# Patient Record
Sex: Male | Born: 1950
Health system: Southern US, Community
[De-identification: ages and names within clinical notes are randomized; demographics above are authoritative.]

## PROBLEM LIST (undated history)

## (undated) DIAGNOSIS — I219 Acute myocardial infarction, unspecified: Secondary | ICD-10-CM

## (undated) DIAGNOSIS — R7303 Prediabetes: Secondary | ICD-10-CM

## (undated) DIAGNOSIS — Z8719 Personal history of other diseases of the digestive system: Secondary | ICD-10-CM

## (undated) DIAGNOSIS — I2510 Atherosclerotic heart disease of native coronary artery without angina pectoris: Secondary | ICD-10-CM

## (undated) DIAGNOSIS — I10 Essential (primary) hypertension: Secondary | ICD-10-CM

## (undated) DIAGNOSIS — E785 Hyperlipidemia, unspecified: Secondary | ICD-10-CM

## (undated) HISTORY — DX: Hyperlipidemia, unspecified: E78.5

## (undated) HISTORY — PX: CHOLECYSTECTOMY: SHX55

## (undated) HISTORY — PX: COLONOSCOPY: SHX174

## (undated) HISTORY — PX: CORONARY ANGIOPLASTY: SHX604

## (undated) HISTORY — DX: Essential (primary) hypertension: I10

## (undated) HISTORY — DX: Prediabetes: R73.03

---

## 1998-09-26 ENCOUNTER — Encounter: Payer: Self-pay | Admitting: Cardiology

## 1998-09-26 ENCOUNTER — Ambulatory Visit (HOSPITAL_COMMUNITY): Admission: RE | Admit: 1998-09-26 | Discharge: 1998-09-26 | Payer: Self-pay | Admitting: Cardiology

## 2001-07-16 ENCOUNTER — Ambulatory Visit (HOSPITAL_COMMUNITY): Admission: RE | Admit: 2001-07-16 | Discharge: 2001-07-16 | Payer: Self-pay | Admitting: Gastroenterology

## 2005-07-29 ENCOUNTER — Emergency Department (HOSPITAL_COMMUNITY): Admission: EM | Admit: 2005-07-29 | Discharge: 2005-07-29 | Payer: Self-pay | Admitting: Emergency Medicine

## 2005-08-01 ENCOUNTER — Ambulatory Visit (HOSPITAL_COMMUNITY): Admission: RE | Admit: 2005-08-01 | Discharge: 2005-08-02 | Payer: Self-pay | Admitting: General Surgery

## 2005-08-01 ENCOUNTER — Encounter (INDEPENDENT_AMBULATORY_CARE_PROVIDER_SITE_OTHER): Payer: Self-pay | Admitting: Specialist

## 2008-08-11 ENCOUNTER — Ambulatory Visit (HOSPITAL_COMMUNITY): Admission: RE | Admit: 2008-08-11 | Discharge: 2008-08-11 | Payer: Self-pay | Admitting: Internal Medicine

## 2009-12-03 DIAGNOSIS — I2510 Atherosclerotic heart disease of native coronary artery without angina pectoris: Secondary | ICD-10-CM

## 2009-12-03 DIAGNOSIS — I219 Acute myocardial infarction, unspecified: Secondary | ICD-10-CM

## 2009-12-03 HISTORY — DX: Acute myocardial infarction, unspecified: I21.9

## 2009-12-03 HISTORY — DX: Atherosclerotic heart disease of native coronary artery without angina pectoris: I25.10

## 2010-05-12 ENCOUNTER — Inpatient Hospital Stay (HOSPITAL_COMMUNITY): Admission: EM | Admit: 2010-05-12 | Discharge: 2010-05-16 | Payer: Self-pay | Admitting: Emergency Medicine

## 2010-05-12 ENCOUNTER — Ambulatory Visit: Payer: Self-pay | Admitting: Cardiology

## 2010-05-31 DIAGNOSIS — R0789 Other chest pain: Secondary | ICD-10-CM | POA: Insufficient documentation

## 2010-05-31 DIAGNOSIS — I214 Non-ST elevation (NSTEMI) myocardial infarction: Secondary | ICD-10-CM | POA: Insufficient documentation

## 2010-05-31 DIAGNOSIS — R5383 Other fatigue: Secondary | ICD-10-CM

## 2010-05-31 DIAGNOSIS — Z8679 Personal history of other diseases of the circulatory system: Secondary | ICD-10-CM | POA: Insufficient documentation

## 2010-05-31 DIAGNOSIS — R5381 Other malaise: Secondary | ICD-10-CM | POA: Insufficient documentation

## 2010-05-31 DIAGNOSIS — I2510 Atherosclerotic heart disease of native coronary artery without angina pectoris: Secondary | ICD-10-CM | POA: Insufficient documentation

## 2010-05-31 DIAGNOSIS — E782 Mixed hyperlipidemia: Secondary | ICD-10-CM | POA: Insufficient documentation

## 2010-06-07 ENCOUNTER — Encounter: Payer: Self-pay | Admitting: Family Medicine

## 2010-06-14 ENCOUNTER — Encounter: Payer: Self-pay | Admitting: Physician Assistant

## 2010-06-14 ENCOUNTER — Ambulatory Visit: Payer: Self-pay | Admitting: Cardiology

## 2010-06-22 ENCOUNTER — Encounter (HOSPITAL_COMMUNITY): Admission: RE | Admit: 2010-06-22 | Discharge: 2010-09-01 | Payer: Self-pay | Admitting: Cardiology

## 2010-06-22 ENCOUNTER — Telehealth (INDEPENDENT_AMBULATORY_CARE_PROVIDER_SITE_OTHER): Payer: Self-pay | Admitting: *Deleted

## 2010-07-06 ENCOUNTER — Encounter: Payer: Self-pay | Admitting: Cardiology

## 2010-07-25 ENCOUNTER — Ambulatory Visit: Payer: Self-pay | Admitting: Cardiology

## 2010-07-25 DIAGNOSIS — I10 Essential (primary) hypertension: Secondary | ICD-10-CM | POA: Insufficient documentation

## 2010-07-26 LAB — CONVERTED CEMR LAB
ALT: 35 units/L (ref 0–53)
AST: 32 units/L (ref 0–37)
Albumin: 4 g/dL (ref 3.5–5.2)
Alkaline Phosphatase: 47 units/L (ref 39–117)
Bilirubin, Direct: 0.1 mg/dL (ref 0.0–0.3)
Cholesterol: 95 mg/dL (ref 0–200)
HDL: 31.6 mg/dL — ABNORMAL LOW (ref >39.00)
LDL Cholesterol: 46 mg/dL (ref 0–99)
Total Bilirubin: 0.6 mg/dL (ref 0.3–1.2)
Total CHOL/HDL Ratio: 3
Total Protein: 7.1 g/dL (ref 6.0–8.3)
Triglycerides: 88 mg/dL (ref 0.0–149.0)
VLDL: 17.6 mg/dL (ref 0.0–40.0)

## 2010-08-18 ENCOUNTER — Ambulatory Visit: Payer: Self-pay | Admitting: Cardiology

## 2010-08-23 ENCOUNTER — Encounter: Payer: Self-pay | Admitting: Cardiology

## 2010-09-02 ENCOUNTER — Encounter (HOSPITAL_COMMUNITY): Admission: RE | Admit: 2010-09-02 | Discharge: 2010-11-01 | Payer: Self-pay | Admitting: Cardiology

## 2010-09-13 ENCOUNTER — Encounter: Payer: Self-pay | Admitting: Cardiology

## 2010-09-19 ENCOUNTER — Ambulatory Visit: Payer: Self-pay | Admitting: Cardiology

## 2010-09-20 ENCOUNTER — Encounter: Payer: Self-pay | Admitting: Cardiology

## 2010-09-20 LAB — CONVERTED CEMR LAB
ALT: 34 units/L (ref 0–53)
AST: 31 units/L (ref 0–37)
Albumin: 4 g/dL (ref 3.5–5.2)
Alkaline Phosphatase: 59 units/L (ref 39–117)
Bilirubin, Direct: 0.1 mg/dL (ref 0.0–0.3)
Cholesterol: 120 mg/dL (ref 0–200)
HDL: 39.2 mg/dL (ref >39.00)
LDL Cholesterol: 66 mg/dL (ref 0–99)
Total Bilirubin: 0.6 mg/dL (ref 0.3–1.2)
Total CHOL/HDL Ratio: 3
Total Protein: 7.3 g/dL (ref 6.0–8.3)
Triglycerides: 72 mg/dL (ref 0.0–149.0)
VLDL: 14.4 mg/dL (ref 0.0–40.0)

## 2010-11-17 ENCOUNTER — Ambulatory Visit: Payer: Self-pay | Admitting: Cardiology

## 2010-12-08 ENCOUNTER — Encounter: Payer: Self-pay | Admitting: Cardiology

## 2011-01-02 NOTE — Progress Notes (Signed)
  Phone Note From Other Clinic   Caller: [facility] Initial call taken by: KM    LOV,12 lead faxed to [phone] [facility]  June 22, 2010 10:46 AM

## 2011-01-02 NOTE — Assessment & Plan Note (Signed)
Summary: 2 MONTH ROV/SL   Visit Type:  2 mo f/u Primary Sanjit Mcmichael:  Marisue Brooklyn  CC:  no cardiac complaints today.  History of Present Illness: Mr Koloski comes in today for evaluation and management of his coronary artery disease, hypertension, hyperlipidemia.  He has done remarkably well over the last couple months since his event. He had a drug-eluting stent placed to the mid and distal right coronary artery. Since that time he's had no recurrent gel taking or chest discomfort.  He's lost 15 pounds. 40 mg of Crestor his total cholesterol was 95, triglycerides 88, LDL 46, HDL 31.6 LFTs were normal. We cut it to 20 mg in half all blood work scheduled in the future.  He had nonobstructive disease in the LAD and some luminal irregularities in the circumflex. His ejection fraction was 50% with hypokinesia the basal inferior wall.    Clinical Reports Reviewed:  Cardiac Cath:  05/16/2010: Cardiac Cath Findings:   FINDINGS:   1. Hemodynamics:  Aorta 115/69, LV 116/10.   2. Left ventriculography:  There was hypokinesis of the basal to mid       inferior wall.  EF was estimated about 50%.   3. Right coronary artery:  The right coronary artery was a dominant       vessel.  There was 95% mid RCA stenosis.  There was about 95% ostial PLV stenosis.  There was a branch       vessel off the proximal PLV that appeared to be totally occluded.   4. Left main:  Luminal irregularities in the left main.   5. Left circumflex system:  There were luminal irregularities in the       left circumflex and a large first obtuse marginal.   6. LAD system:  There was a moderate-sized first diagonal luminal       irregularities.  There was about 30% proximal LAD stenosis and 30%       to 40% diffuse distal LAD stenosis.      IMPRESSION:  A 60 year old with a non-ST-elevation myocardial   infarction, culprit is a tight mid right coronary artery lesion and   ostial posterior left ventricular lesion.  We will  plan percutaneous   coronary intervention today.               Marca Ancona, MD         05/15/2010: Cardiac Cath Findings:   CONCLUSION:  Successful PCI of tandem lesions in the mid and distal   right coronary artery using Promus drug-eluting stents with improvement   in mid lesion from 95% to 0% and improvement in distal lesion from 95%   to 0%.      DISPOSITION:  The patient returned to post angio room for further   observation.  We will plan Plavix for a minimum of 1 year.               Bruce Elvera Lennox Juanda Chance, MD, [facility]          Current Medications (verified): 1)  Aspirin Ec 325 Mg Tbec (Aspirin) .... Take One Tablet By Mouth Daily 2)  Benicar 20 Mg Tabs (Olmesartan Medoxomil) .... Take One Tablet By Mouth Daily 3)  Fish Oil 1000 Mg Caps (Omega-3 Fatty Acids) .... 2 Cap Two Times A Day 4)  Flax Seed Oil 1000 Mg Caps (Flaxseed (Linseed)) .... 2 Tabs Two Times A Day 5)  Multivitamins   Tabs (Multiple Vitamin) .Marland Kitchen.. 1 Tab Once Daily 6)  Vitamin D3 2000 Unit Caps (Cholecalciferol) .Marland Kitchen.. 1 Cap Two Times A Day 7)  Zantac 75 75 Mg Tabs (Ranitidine Hcl) .Marland Kitchen.. 1 Tab Once Daily 8)  Crestor 40 Mg Tabs (Rosuvastatin Calcium) .... Take One- Half Tablet Daily 9)  Plavix 75 Mg Tabs (Clopidogrel Bisulfate) .... Take One Tablet By Mouth Daily 10)  Metoprolol Tartrate 25 Mg Tabs (Metoprolol Tartrate) .... 1/2 Tab Bid 11)  Allegra 180 Mg Tabs (Fexofenadine Hcl) .... Daily  Allergies (verified): No Known Drug Allergies  Past History:  Past Medical History: Last updated: 05/31/2010 1. Hypertension for approximately 5 years.  2. Hyperlipidemia, borderline.  3. Status post stress test x2, the last one 5-6 years ago and without       significant abnormality per the patient.   Past Surgical History: Last updated: 05/31/2010 Cholecystectomy Colonoscopy  Family History: Last updated: 05/31/2010  His mother is alive at 54 with no heart disease and his   father died at 79 with a history of  heart disease and stroke.  No   siblings have heart disease.  However, he has one uncle and three   cousins who died suddenly.  The patient was told that they died of heart  attacks, but they died suddenly, one on a golf course and one in his  sleep and to the patient's knowledge, no autopsy was done.      Social History: Last updated: 05/31/2010  He lives in [place] with his wife.  They run a   dairy farm.  He quit tobacco greater than 20 years ago with   approximately 10 pack-year history and denies alcohol or drug abuse.      Review of Systems       negative other than history of present illness  Vital Signs:  Patient profile:   60 year old male Height:      70 inches Weight:      222 pounds BMI:     31.97 Pulse rate:   70 / minute Pulse rhythm:   regular BP sitting:   122 / 70  (left arm) Cuff size:   large  Vitals Entered By: Danielle Rankin, CMA (August 18, 2010 2:59 PM)  Physical Exam  General:  muscular, no acute distress, overweight Head:  normocephalic and atraumatic Eyes:  PERRLA/EOM intact; conjunctiva and lids normal. Neck:  Neck supple, no JVD. No masses, thyromegaly or abnormal cervical nodes. Chest Wall:  no deformities or breast masses noted Lungs:  Clear bilaterally to auscultation and percussion. Heart:  PMI nondisplaced, regular rate and rhythm, normal S1-S2, carotid upstrokes are equal without bruits Msk:  Back normal, normal gait. Muscle strength and tone normal. Pulses:  pulses normal in all 4 extremities Extremities:  No clubbing or cyanosis. Neurologic:  Alert and oriented x 3. Skin:  Intact without lesions or rashes. Psych:  Normal affect.   Impression & Recommendations:  Problem # 1:  CAD, NATIVE VESSEL (ICD-414.01) Assessment Improved  His updated medication list for this problem includes:    Aspirin Ec 325 Mg Tbec (Aspirin) .Marland Kitchen... Take one tablet by mouth daily    Plavix 75 Mg Tabs (Clopidogrel bisulfate) .Marland Kitchen... Take one tablet by  mouth daily    Metoprolol Tartrate 25 Mg Tabs (Metoprolol tartrate) .Marland Kitchen... 1/2 tab bid  Problem # 2:  ESSENTIAL HYPERTENSION, BENIGN (ICD-401.1) Assessment: Improved  His updated medication list for this problem includes:    Aspirin Ec 325 Mg Tbec (Aspirin) .Marland Kitchen... Take one tablet by mouth daily  Benicar 20 Mg Tabs (Olmesartan medoxomil) .Marland Kitchen... Take one tablet by mouth daily    Metoprolol Tartrate 25 Mg Tabs (Metoprolol tartrate) .Marland Kitchen... 1/2 tab bid  Problem # 3:  MYOCARDIAL INFARCTION, ACUTE, SUBENDOCARDIAL (ICD-410.70) Assessment: Unchanged  His updated medication list for this problem includes:    Aspirin Ec 325 Mg Tbec (Aspirin) .Marland Kitchen... Take one tablet by mouth daily    Plavix 75 Mg Tabs (Clopidogrel bisulfate) .Marland Kitchen... Take one tablet by mouth daily    Metoprolol Tartrate 25 Mg Tabs (Metoprolol tartrate) .Marland Kitchen... 1/2 tab bid  Problem # 4:  HYPERLIPIDEMIA, BORDERLINE (ICD-272.4) Assessment: Improved followup labs in 4-6 weeks His updated medication list for this problem includes:    Crestor 40 Mg Tabs (Rosuvastatin calcium) .Marland Kitchen... Take one- half tablet daily  Patient Instructions: 1)  Your physician recommends that you schedule a follow-up appointment in: December 16,11 at 10:45 am 2)  Your physician recommends that you continue on your current medications as directed. Please refer to the Current Medication list given to you today. 3)  Your physician recommends that you return for a FASTING lipid profile and liver  Monday October 17,11 at 8:45 am

## 2011-01-02 NOTE — Miscellaneous (Signed)
Summary: [facility]   [facility]   Imported By: Roderic Ovens 08/14/2010 13:14:52  _____________________________________________________________________  External Attachment:    Type:   Image     Comment:   External Document

## 2011-01-02 NOTE — Letter (Signed)
[facility]   Imported By: Marylou Mccoy 08/08/2010 12:53:09  _____________________________________________________________________  External Attachment:    Type:   Image     Comment:   External Document

## 2011-01-02 NOTE — Assessment & Plan Note (Signed)
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 He had an ejection fraction of 50% with inferior hypokinesis. Otherwise he had nonobstructive coronary disease.  The patient has gone back to work full time and is working 14-16 hour days according to his wife. He is also out in the heat. He feels well without chest pain and says he is no longer tired like he was the first 2 weeks after hospitalization. He denies chest pain palpitations dyspnea dyspnea on exertion dizziness or presyncope. He is supposed to start cardiac rehabilitation but is reluctant to do this.  Current Medications (verified): 1)  Aspirin Ec 325 Mg Tbec (Aspirin) .... Take One Tablet By Mouth Daily 2)  Benicar 20 Mg Tabs (Olmesartan Medoxomil) .... Take One Tablet By Mouth Daily 3)  Fish Oil 1000 Mg Caps (Omega-3 Fatty Acids) .... 2 Cap Two Times A Day 4)  Flax Seed Oil 1000 Mg Caps (Flaxseed (Linseed)) .... 2 Tabs Two Times A Day 5)  Multivitamins   Tabs (Multiple Vitamin) .Marland Kitchen.. 1 Tab Once Daily 6)  Vitamin D3 2000 Unit Tabs (Cholecalciferol) .Marland Kitchen.. 1 Tab Two Times A Day 7)  Zantac 75 75 Mg Tabs (Ranitidine Hcl) .Marland Kitchen.. 1 Tab Once Daily 8)  Crestor 40 Mg Tabs (Rosuvastatin Calcium) .... Take One Tablet By Mouth Daily. 9)  Plavix 75 Mg Tabs (Clopidogrel Bisulfate) .... Take One Tablet By Mouth Daily 10)  Metoprolol Tartrate 25 Mg Tabs (Metoprolol Tartrate) .... 1/2 Tab Bid 11)  Allegra 180 Mg Tabs (Fexofenadine Hcl) .... Daily  Allergies (verified): No Known Drug Allergies  Past History:  Past Medical History: Last updated: 05/31/2010 1. Hypertension for approximately 5 years.  2. Hyperlipidemia, borderline.  3. Status post stress test x2, the last one 5-6 years ago and without   significant abnormality per the patient.   Social History: Reviewed history from 05/31/2010 and no changes required.  He lives in [place] with his wife.  They run a   dairy farm.  He quit tobacco greater than 20 years ago with   approximately 10 pack-year history and denies alcohol or drug abuse.      Review of Systems       see history of present illness  Vital Signs:  Patient profile:   60 year old male Height:      70 inches Weight:      224 pounds BMI:     32.26 BP sitting:   116 / 79  (left arm) Cuff size:   large  Vitals Entered By: Burnett Kanaris, CNA (June 14, 2010 12:19 PM)  Physical Exam  General:   Well-nournished, in no acute distress. Neck: No JVD, HJR, Bruit, or thyroid enlargement Lungs: No tachypnea, clear without wheezing, rales, or rhonchi Cardiovascular: RRR, PMI not displaced, heart sounds normal, no murmurs, gallops, bruit, thrill, or heave. Abdomen: BS normal. Soft without organomegaly, masses, lesions or tenderness. Extremities: right groin without hematoma or hemorrhage,without cyanosis, clubbing or edema. Good distal pulses bilateral SKin: Warm, no lesions or rashes  Musculoskeletal: No deformities Neuro: no focal signs    EKG  Procedure date:  06/14/2010  Findings:      normal sinus rhythm normal EKG  Impression & Recommendations:  Problem # 1:  CAD, NATIVE VESSEL (  ICD-414.01) Patient is status post non-ST elevation MI treated with 2 drug-eluting stents to the RCA. 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 I've asked him to cut back on this and have encouraged cardiac rehabilitation. His updated medication list for this problem includes:    Aspirin Ec 325 Mg Tbec (Aspirin) .Marland Kitchen... Take one tablet by mouth daily    Plavix 75 Mg Tabs (Clopidogrel bisulfate) .Marland Kitchen... Take one tablet by mouth daily    Metoprolol Tartrate 25 Mg Tabs (Metoprolol tartrate) .Marland Kitchen... 1/2 tab bid  His updated medication list for this problem includes:     Aspirin Ec 325 Mg Tbec (Aspirin) .Marland Kitchen... Take one tablet by mouth daily    Plavix 75 Mg Tabs (Clopidogrel bisulfate) .Marland Kitchen... Take one tablet by mouth daily    Metoprolol Tartrate 25 Mg Tabs (Metoprolol tartrate) .Marland Kitchen... 1/2 tab bid  Problem # 2:  HYPERTENSION, HX OF (ICD-V12.50) blood pressure controlled  Problem # 3:  HYPERLIPIDEMIA, BORDERLINE (ICD-272.4) check LFTs and lipid profile in 6 weeks His updated medication list for this problem includes:    Crestor 40 Mg Tabs (Rosuvastatin calcium) .Marland Kitchen... Take one tablet by mouth daily.  Other Orders: EKG w/ Interpretation (93000)  Patient Instructions: 1)  Your physician recommends that you schedule a follow-up appointment in: 2 MONTHS WITH DR WALL 2)  Your physician recommends that you return for lab work in: 6 WEEKS FASTING LIPID LIVER  3)  Your physician recommends that you continue on your current medications as directed. Please refer to the Current Medication list given to you today.

## 2011-01-02 NOTE — Letter (Signed)
[facility]   Imported By: Marylou Mccoy 09/11/2010 08:19:57  _____________________________________________________________________  External Attachment:    Type:   Image     Comment:   External Document

## 2011-01-02 NOTE — Miscellaneous (Signed)
Summary: [facility]   [facility]   Imported By: Roderic Ovens 06/13/2010 14:20:35  _____________________________________________________________________  External Attachment:    Type:   Image     Comment:   External Document

## 2011-01-02 NOTE — Miscellaneous (Signed)
Summary: [facility] Progress Note   [facility] Progress Note   Imported By: Roderic Ovens 09/29/2010 15:26:13  _____________________________________________________________________  External Attachment:    Type:   Image     Comment:   External Document

## 2011-01-04 NOTE — Assessment & Plan Note (Signed)
Summary: f71m/dfg   Visit Type:  3 mo f/u Primary Provider:  Marisue Brooklyn  CC:  pt denies any cardiac complaints today...no leg cramps/pain.  History of Present Illness: [name] returns today 6 months out from his non-ST segment elevation MI and drug-eluting stents to his right coronary artery.  He still working hard manually and having no angina or ischemic symptoms. He is carrying nitroglycerin. He is very compliant with his diet and his weight has stayed down. He recently had blood work which have reviewed with him today showing a total cholesterol 120, triglycerides 72, LDL 66, HDL 39.2. LFTs were normal. Fasting blood sugar from his primary care office was normal as was his renal function.  Clinical Reports Reviewed:  Cardiac Cath:  05/16/2010: Cardiac Cath Findings:   FINDINGS:   1. Hemodynamics:  Aorta 115/69, LV 116/10.   2. Left ventriculography:  There was hypokinesis of the basal to mid       inferior wall.  EF was estimated about 50%.   3. Right coronary artery:  The right coronary artery was a dominant       vessel.  There was 95% mid RCA stenosis.  There was about 95% ostial PLV stenosis.  There was a branch       vessel off the proximal PLV that appeared to be totally occluded.   4. Left main:  Luminal irregularities in the left main.   5. Left circumflex system:  There were luminal irregularities in the       left circumflex and a large first obtuse marginal.   6. LAD system:  There was a moderate-sized first diagonal luminal       irregularities.  There was about 30% proximal LAD stenosis and 30%       to 40% diffuse distal LAD stenosis.      IMPRESSION:  A 60 year old with a non-ST-elevation myocardial   infarction, culprit is a tight mid right coronary artery lesion and   ostial posterior left ventricular lesion.  We will plan percutaneous   coronary intervention today.               Marca Ancona, MD         05/15/2010: Cardiac Cath Findings:    CONCLUSION:  Successful PCI of tandem lesions in the mid and distal   right coronary artery using Promus drug-eluting stents with improvement   in mid lesion from 95% to 0% and improvement in distal lesion from 95%   to 0%.      DISPOSITION:  The patient returned to post angio room for further   observation.  We will plan Plavix for a minimum of 1 year.               Bruce Elvera Lennox Juanda Chance, MD, [facility]          Current Medications (verified): 1)  Aspirin Ec 325 Mg Tbec (Aspirin) .... Take One Tablet By Mouth Daily 2)  Benicar 20 Mg Tabs (Olmesartan Medoxomil) .... Take One Tablet By Mouth Daily 3)  Fish Oil 1000 Mg Caps (Omega-3 Fatty Acids) .... 2 Cap Two Times A Day 4)  Flax Seed Oil 1000 Mg Caps (Flaxseed (Linseed)) .... 2 Tabs Two Times A Day 5)  Multivitamins   Tabs (Multiple Vitamin) .Marland Kitchen.. 1 Tab Once Daily 6)  Vitamin D3 2000 Unit Caps (Cholecalciferol) .Marland Kitchen.. 1 Cap Two Times A Day 7)  Zantac 75 75 Mg Tabs (Ranitidine Hcl) .Marland Kitchen.. 1 Tab Once Daily 8)  Crestor 40 Mg Tabs (Rosuvastatin Calcium) .... Take One- Half Tablet Daily 9)  Plavix 75 Mg Tabs (Clopidogrel Bisulfate) .... Take One Tablet By Mouth Daily 10)  Metoprolol Tartrate 25 Mg Tabs (Metoprolol Tartrate) .... 1/2 Tab Two Times A Day 11)  Allegra 180 Mg Tabs (Fexofenadine Hcl) .... Daily  Allergies (verified): No Known Drug Allergies  Past History:  Past Medical History: Last updated: 05/31/2010 1. Hypertension for approximately 5 years.  2. Hyperlipidemia, borderline.  3. Status post stress test x2, the last one 5-6 years ago and without       significant abnormality per the patient.   Past Surgical History: Last updated: 05/31/2010 Cholecystectomy Colonoscopy  Family History: Last updated: 05/31/2010  His mother is alive at 27 with no heart disease and his   father died at 44 with a history of heart disease and stroke.  No   siblings have heart disease.  However, he has one uncle and three   cousins who died  suddenly.  The patient was told that they died of heart  attacks, but they died suddenly, one on a golf course and one in his  sleep and to the patient's knowledge, no autopsy was done.      Social History: Last updated: 05/31/2010  He lives in [place] with his wife.  They run a   dairy farm.  He quit tobacco greater than 20 years ago with   approximately 10 pack-year history and denies alcohol or drug abuse.      Review of Systems       negative other than history of present illness  Vital Signs:  Patient profile:   60 year old male Height:      70 inches Weight:      222 pounds BMI:     31.97 Pulse rate:   66 / minute Pulse rhythm:   regular BP sitting:   104 / 60  (left arm) Cuff size:   large  Vitals Entered By: Danielle Rankin, CMA (November 17, 2010 11:05 AM)  Physical Exam  General:  no acute distress, muscular Head:  normocephalic and atraumatic Eyes:  PERRLA/EOM intact; conjunctiva and lids normal. Neck:  Neck supple, no JVD. No masses, thyromegaly or abnormal cervical nodes. Lungs:  Clear bilaterally to auscultation and percussion. Heart:  PMI normal, normal S1-S2, regular rate and rhythm, no bruits Msk:  Back normal, normal gait. Muscle strength and tone normal. Pulses:  pulses normal in all 4 extremities Extremities:  No clubbing or cyanosis. Neurologic:  Alert and oriented x 3. Skin:  Intact without lesions or rashes. Psych:  Normal affect.   Impression & Recommendations:  Problem # 1:  ESSENTIAL HYPERTENSION, BENIGN (ICD-401.1) Assessment Improved  His updated medication list for this problem includes:    Aspirin Ec 325 Mg Tbec (Aspirin) .Marland Kitchen... Take one tablet by mouth daily    Benicar 20 Mg Tabs (Olmesartan medoxomil) .Marland Kitchen... Take one tablet by mouth daily    Metoprolol Tartrate 25 Mg Tabs (Metoprolol tartrate) .Marland Kitchen... 1/2 tab two times a day  Problem # 2:  MYOCARDIAL INFARCTION, ACUTE, SUBENDOCARDIAL (ICD-410.70) Assessment: Unchanged  His updated  medication list for this problem includes:    Aspirin Ec 325 Mg Tbec (Aspirin) .Marland Kitchen... Take one tablet by mouth daily    Plavix 75 Mg Tabs (Clopidogrel bisulfate) .Marland Kitchen... Take one tablet by mouth daily    Metoprolol Tartrate 25 Mg Tabs (Metoprolol tartrate) .Marland Kitchen... 1/2 tab two times a day  Problem # 3:  CAD, NATIVE VESSEL (ICD-414.01) Assessment: Unchanged  His updated medication list for this problem includes:    Aspirin Ec 325 Mg Tbec (Aspirin) .Marland Kitchen... Take one tablet by mouth daily    Plavix 75 Mg Tabs (Clopidogrel bisulfate) .Marland Kitchen... Take one tablet by mouth daily    Metoprolol Tartrate 25 Mg Tabs (Metoprolol tartrate) .Marland Kitchen... 1/2 tab two times a day  Problem # 4:  CHEST PAIN, SUBSTERNAL (ICD-786.59) Assessment: Improved  His updated medication list for this problem includes:    Aspirin Ec 325 Mg Tbec (Aspirin) .Marland Kitchen... Take one tablet by mouth daily    Plavix 75 Mg Tabs (Clopidogrel bisulfate) .Marland Kitchen... Take one tablet by mouth daily    Metoprolol Tartrate 25 Mg Tabs (Metoprolol tartrate) .Marland Kitchen... 1/2 tab two times a day  Problem # 5:  FATIGUE / MALAISE (ICD-780.79) Assessment: Improved  Patient Instructions: 1)  Your physician recommends that you schedule a follow-up appointment in: 6 months with Dr. Daleen Squibb 2)  Your physician recommends that you continue on your current medications as directed. Please refer to the Current Medication list given to you today. Prescriptions: METOPROLOL TARTRATE 25 MG TABS (METOPROLOL TARTRATE) 1/2 tab two times a day  #30 x 11   Entered by:   Danielle Rankin, CMA   Authorized by:   Gaylord Shih, MD, [facility]   Signed by:   Danielle Rankin, CMA on 11/17/2010   Method used:   Electronically to        [facility] [address] [phone]* (retail)       [address] [place], [place]  [address]       Ph: [phone]       Fax: [phone]   RxID:   [id number] PLAVIX 75 MG TABS (CLOPIDOGREL BISULFATE) Take one tablet by mouth daily  #30 x 11   Entered by:   Danielle Rankin, CMA    Authorized by:   Gaylord Shih, MD, [facility]   Signed by:   Danielle Rankin, CMA on 11/17/2010   Method used:   Electronically to        [facility] [phone] (retail)       [address] [place], [place]  [address]       Ph: [phone]       Fax: [phone]   RxID:   [id number]

## 2011-01-04 NOTE — Miscellaneous (Signed)
Summary: [facility]   [facility]   Imported By: Roderic Ovens 12/25/2010 15:53:57  _____________________________________________________________________  External Attachment:    Type:   Image     Comment:   External Document

## 2011-02-19 LAB — CBC
HCT: 40.5 % (ref 39.0–52.0)
HCT: 41.6 % (ref 39.0–52.0)
HCT: 41.7 % (ref 39.0–52.0)
HCT: 41.9 % (ref 39.0–52.0)
HCT: 44 % (ref 39.0–52.0)
Hemoglobin: 13.8 g/dL (ref 13.0–17.0)
Hemoglobin: 14 g/dL (ref 13.0–17.0)
Hemoglobin: 14.1 g/dL (ref 13.0–17.0)
Hemoglobin: 14.3 g/dL (ref 13.0–17.0)
Hemoglobin: 14.8 g/dL (ref 13.0–17.0)
MCHC: 33.5 g/dL (ref 30.0–36.0)
MCHC: 33.6 g/dL (ref 30.0–36.0)
MCHC: 33.8 g/dL (ref 30.0–36.0)
MCHC: 34.1 g/dL (ref 30.0–36.0)
MCHC: 34.2 g/dL (ref 30.0–36.0)
MCV: 92.2 fL (ref 78.0–100.0)
MCV: 92.6 fL (ref 78.0–100.0)
MCV: 92.7 fL (ref 78.0–100.0)
MCV: 92.7 fL (ref 78.0–100.0)
MCV: 92.9 fL (ref 78.0–100.0)
Platelets: 193 10*3/uL (ref 150–400)
Platelets: 204 10*3/uL (ref 150–400)
Platelets: 205 10*3/uL (ref 150–400)
Platelets: 218 10*3/uL (ref 150–400)
Platelets: 225 10*3/uL (ref 150–400)
RBC: 4.36 MIL/uL (ref 4.22–5.81)
RBC: 4.48 MIL/uL (ref 4.22–5.81)
RBC: 4.52 MIL/uL (ref 4.22–5.81)
RBC: 4.53 MIL/uL (ref 4.22–5.81)
RBC: 4.74 MIL/uL (ref 4.22–5.81)
RDW: 13.1 % (ref 11.5–15.5)
RDW: 13.3 % (ref 11.5–15.5)
RDW: 13.3 % (ref 11.5–15.5)
RDW: 13.5 % (ref 11.5–15.5)
RDW: 13.6 % (ref 11.5–15.5)
WBC: 10.3 10*3/uL (ref 4.0–10.5)
WBC: 8.6 10*3/uL (ref 4.0–10.5)
WBC: 8.8 10*3/uL (ref 4.0–10.5)
WBC: 8.9 10*3/uL (ref 4.0–10.5)
WBC: 9.4 10*3/uL (ref 4.0–10.5)

## 2011-02-19 LAB — CARDIAC PANEL(CRET KIN+CKTOT+MB+TROPI)
CK, MB: 6.7 ng/mL (ref 0.3–4.0)
CK, MB: 7.9 ng/mL (ref 0.3–4.0)
CK, MB: 9.5 ng/mL (ref 0.3–4.0)
Relative Index: 3.8 — ABNORMAL HIGH (ref 0.0–2.5)
Relative Index: 4.2 — ABNORMAL HIGH (ref 0.0–2.5)
Relative Index: 4.2 — ABNORMAL HIGH (ref 0.0–2.5)
Total CK: 177 U/L (ref 7–232)
Total CK: 189 U/L (ref 7–232)
Total CK: 224 U/L (ref 7–232)
Troponin I: 0.67 ng/mL (ref 0.00–0.06)
Troponin I: 0.73 ng/mL (ref 0.00–0.06)
Troponin I: 0.77 ng/mL (ref 0.00–0.06)

## 2011-02-19 LAB — DIFFERENTIAL
Basophils Absolute: 0 10*3/uL (ref 0.0–0.1)
Basophils Relative: 0 % (ref 0–1)
Eosinophils Absolute: 0.1 10*3/uL (ref 0.0–0.7)
Eosinophils Relative: 1 % (ref 0–5)
Lymphocytes Relative: 20 % (ref 12–46)
Lymphs Abs: 1.7 10*3/uL (ref 0.7–4.0)
Monocytes Absolute: 0.6 10*3/uL (ref 0.1–1.0)
Monocytes Relative: 6 % (ref 3–12)
Neutro Abs: 6.5 10*3/uL (ref 1.7–7.7)
Neutrophils Relative %: 73 % (ref 43–77)

## 2011-02-19 LAB — MAGNESIUM: Magnesium: 2.1 mg/dL (ref 1.5–2.5)

## 2011-02-19 LAB — LIPID PANEL
Cholesterol: 160 mg/dL (ref 0–200)
HDL: 38 mg/dL — ABNORMAL LOW (ref >39)
LDL Cholesterol: 101 mg/dL — ABNORMAL HIGH (ref 0–99)
Total CHOL/HDL Ratio: 4.2 RATIO
Triglycerides: 107 mg/dL (ref <150)
VLDL: 21 mg/dL (ref 0–40)

## 2011-02-19 LAB — PLATELET INHIBITION P2Y12
P2Y12 % Inhibition: 73 %
Platelet Function  P2Y12: 69 [PRU] — ABNORMAL LOW (ref 194–418)
Platelet Function Baseline: 253 [PRU] (ref 194–418)

## 2011-02-19 LAB — COMPREHENSIVE METABOLIC PANEL
ALT: 40 U/L (ref 0–53)
AST: 31 U/L (ref 0–37)
Albumin: 3 g/dL — ABNORMAL LOW (ref 3.5–5.2)
Alkaline Phosphatase: 50 U/L (ref 39–117)
BUN: 22 mg/dL (ref 6–23)
CO2: 26 mEq/L (ref 19–32)
Calcium: 8.3 mg/dL — ABNORMAL LOW (ref 8.4–10.5)
Chloride: 107 mEq/L (ref 96–112)
Creatinine, Ser: 0.9 mg/dL (ref 0.4–1.5)
GFR calc Af Amer: >60 mL/min (ref >60)
GFR calc non Af Amer: >60 mL/min (ref >60)
Glucose, Bld: 99 mg/dL (ref 70–99)
Potassium: 3.8 mEq/L (ref 3.5–5.1)
Sodium: 140 mEq/L (ref 135–145)
Total Bilirubin: 0.5 mg/dL (ref 0.3–1.2)
Total Protein: 6.2 g/dL (ref 6.0–8.3)

## 2011-02-19 LAB — HEPARIN LEVEL (UNFRACTIONATED)
Heparin Unfractionated: 0.14 IU/mL — ABNORMAL LOW (ref 0.30–0.70)
Heparin Unfractionated: 0.31 IU/mL (ref 0.30–0.70)
Heparin Unfractionated: 0.48 IU/mL (ref 0.30–0.70)
Heparin Unfractionated: 0.6 IU/mL (ref 0.30–0.70)
Heparin Unfractionated: 0.62 IU/mL (ref 0.30–0.70)
Heparin Unfractionated: 0.89 IU/mL — ABNORMAL HIGH (ref 0.30–0.70)

## 2011-02-19 LAB — POCT I-STAT, CHEM 8
BUN: 28 mg/dL — ABNORMAL HIGH (ref 6–23)
Calcium, Ion: 1.14 mmol/L (ref 1.12–1.32)
Chloride: 110 mEq/L (ref 96–112)
Creatinine, Ser: 0.9 mg/dL (ref 0.4–1.5)
Glucose, Bld: 108 mg/dL — ABNORMAL HIGH (ref 70–99)
HCT: 43 % (ref 39.0–52.0)
Hemoglobin: 14.6 g/dL (ref 13.0–17.0)
Potassium: 4.6 mEq/L (ref 3.5–5.1)
Sodium: 141 mEq/L (ref 135–145)
TCO2: 27 mmol/L (ref 0–100)

## 2011-02-19 LAB — HEMOGLOBIN A1C
Hgb A1c MFr Bld: 6.3 % — ABNORMAL HIGH (ref <5.7)
Mean Plasma Glucose: 134 mg/dL — ABNORMAL HIGH (ref <117)

## 2011-02-19 LAB — POCT CARDIAC MARKERS
CKMB, poc: 3.9 ng/mL (ref 1.0–8.0)
Myoglobin, poc: 117 ng/mL (ref 12–200)
Troponin i, poc: <0.05 ng/mL (ref 0.00–0.09)

## 2011-02-19 LAB — BASIC METABOLIC PANEL
BUN: 13 mg/dL (ref 6–23)
BUN: 13 mg/dL (ref 6–23)
CO2: 27 mEq/L (ref 19–32)
CO2: 27 mEq/L (ref 19–32)
Calcium: 8.8 mg/dL (ref 8.4–10.5)
Calcium: 9.2 mg/dL (ref 8.4–10.5)
Chloride: 105 mEq/L (ref 96–112)
Chloride: 107 mEq/L (ref 96–112)
Creatinine, Ser: 0.84 mg/dL (ref 0.4–1.5)
Creatinine, Ser: 0.93 mg/dL (ref 0.4–1.5)
GFR calc Af Amer: >60 mL/min (ref >60)
GFR calc Af Amer: >60 mL/min (ref >60)
GFR calc non Af Amer: >60 mL/min (ref >60)
GFR calc non Af Amer: >60 mL/min (ref >60)
Glucose, Bld: 100 mg/dL — ABNORMAL HIGH (ref 70–99)
Glucose, Bld: 103 mg/dL — ABNORMAL HIGH (ref 70–99)
Potassium: 4.1 mEq/L (ref 3.5–5.1)
Potassium: 4.2 mEq/L (ref 3.5–5.1)
Sodium: 139 mEq/L (ref 135–145)
Sodium: 139 mEq/L (ref 135–145)

## 2011-02-19 LAB — APTT: aPTT: 28 seconds (ref 24–37)

## 2011-02-19 LAB — PROTIME-INR
INR: 1.1 (ref 0.00–1.49)
Prothrombin Time: 14.1 seconds (ref 11.6–15.2)

## 2011-02-19 LAB — TSH: TSH: 0.898 u[IU]/mL (ref 0.350–4.500)

## 2011-04-05 ENCOUNTER — Other Ambulatory Visit: Payer: Self-pay | Admitting: Cardiovascular Disease

## 2011-04-06 ENCOUNTER — Other Ambulatory Visit: Payer: Self-pay | Admitting: *Deleted

## 2011-04-06 MED ORDER — ROSUVASTATIN CALCIUM 40 MG PO TABS: 40.0000 mg | ORAL_TABLET | Freq: Every day | ORAL | Status: DC

## 2011-04-10 ENCOUNTER — Other Ambulatory Visit: Payer: Self-pay | Admitting: Dermatology

## 2011-04-20 NOTE — Procedures (Signed)
[place]  Patient:    Paul Rubio, Paul Rubio                      MRN: [id number] Proc. Date: 07/16/01 Adm. Date:  [id number] Attending:  Nelda Marseille CC:         Elvina Sidle, M.D.   Procedure Report  PROCEDURE PERFORMED:  Colonoscopy.  ENDOSCOPIST:  Petra Kuba, M.D.  INDICATIONS FOR PROCEDURE:  Guaiac positivity.  Consent was signed prior to any premeds given after risks, benefits, methods, and options were thoroughly discussed in the office.  MEDICATIONS USED:  Demerol 50 mg, Versed 5 mg.  DESCRIPTION OF PROCEDURE:  Rectal inspection was pertinent for external hemorrhoids.  Digital exam was negative.  The video colonoscope was inserted and easily advanced around the colon to the cecum.  This did require some abdominal pressure but no position changes.  No obvious abnormality was seen on insertion.  The cecum was identified by the appendiceal orifice and ileocecal valve.  In fact the scope was inserted a short ways in to the terminal ileum which was normal.  Photodocumentation was obtained.  Scope was slowly withdrawn.  The prep was adequate.  There was some liquid stool that required washing and suctioning.  On slow withdrawal through the colon, there was a rare right-sided diverticulum and an occasional left-sided diverticulum but no polypoid lesions, masses, or signs of bleeding were seen as we slowly withdrew back in the rectum.  Once back in the rectum, the scope was retroflexed, pertinent for some internal hemorrhoids.  The scope was straightened and readvanced around the left side of the colon.  Air was suctioned, scope removed.  The patient tolerated the procedure well.  There was no obvious immediate complication.  ENDOSCOPIC DIAGNOSIS: 1. Internal and external hemorrhoids. 2. Occasional left and rare right diverticula. 3. Otherwise within normal limits to the terminal ileum.  PLAN:  Follow-up p.r.n. or in six to eight  weeks to recheck guaiac symptoms, possibly CBC and decide any other work-up and plans like a one-time EGD or upper GI small bowel follow-through.  Happy to see back sooner or p.r.n. DD:  07/16/01 TD:  07/16/01 Job: [id number]

## 2011-04-20 NOTE — Op Note (Signed)
NAMEKHUSH, PASION Paul.:  [id number]   MEDICAL RECORD Paul.:  [id number]          PATIENT TYPE:  OIB   LOCATION:  0098                         FACILITY:  [facility]   PHYSICIAN:  Anselm Pancoast. Weatherly, M.D.DATE OF BIRTH:  [birth year]   DATE OF PROCEDURE:  08/01/2005  DATE OF DISCHARGE:                                 OPERATIVE REPORT   PREOPERATIVE DIAGNOSIS:  Acute cholecystitis.   POSTOPERATIVE DIAGNOSIS:  Acute cholecystitis with stones.   OPERATION:  Laparoscopic cholecystectomy with cholangiogram.   SURGEON:  Anselm Pancoast. Zachery Dakins, M.D.   ASSISTANT:  Angelia Mould. Derrell Lolling, M.D.   HISTORY:  Thunder Bridgewater is a 60 year old male who I first met yesterday  afternoon when he presented to the office with the following history.  He  said he had the severe onset of epigastric pain on Sunday morning.  It  became worse during the day and went to the emergency room at approximately  6 p.m.  Treated with pain medications, laboratory studies.  White count was  elevated.  Ultrasound of the gallbladder was obtained, and it showed stone  impacted at the neck of the gallbladder.  I think the surgeon on call was  contacted, and it was arranged for him to be seen in the office the  following morning.  However, when the appointment was called, the particular  physician that he requested was not available.  He continued to have the  pain and was taking hydrocodone.  Burgess Estelle, his wife called and said he  needed to be seen.  I had the urgent office and saw him at approximately  4:30.   On examination, he was tender in the right upper quadrant.  Arrangements  were made to get him on the schedule as soon as possible.  That was today.  Preoperatively, I did place him on antibiotics, which he took last night and  this morning.  Patient was not having any fever.  His pain was all generally  localized to the right upper quadrant, and he said really the pain was less  than it had been on  Sunday.  When he came in today, his temperature was  normal at 97.2.  Pulse 80.  Blood pressure 130/85.  The patient weighs about  220 pounds.   He was taken to the operating room where 3 gm of Unasyn given, basically  with starting the anesthesia.  The abdomen was shaved around the umbilicus  and in the epigastric area clipped.  Prepped with Betadine surgical scrub  and draped in a sterile manner.  A small incision was made below the  umbilicus.  The fascia identified.  This was picked up between two Kochers,  and a small opening carefully made into the peritoneal cavity.  The  peritoneum was identified and opened.  A purse-string suture of 0 Vicryl was  placed, and Hasson cannula introduced.  The gallbladder was kind of covered  with the omentum, but you could see the marked inflammation.  The upper 10  mm trocars were placed after anesthetizing the fascia, and the two lateral 5  mm trocars  were placed by Dr. Derrell Lolling at the appropriate position.  Omentum  was stripped away from the markedly inflamed gallbladder, and a picture was  taken.  We then decompressed the gallbladder so it could be grasped with the  graspers.  The gallbladder was retracted up and laterally.  The proximal  omentum was kind of stripped down from it, carefully dissected.  The  anterior cystic artery branch was separated from the markedly inflamed  gallbladder, double clipped, __________ distally, and divided.  We carefully  peeled down and could feel the large stone impacted in the neck of the  gallbladder and cystic duct junction.  This was kind of pushed back into the  fundus of the gallbladder, and then I could separate the cystic duct from  the surrounding structures, carefully without bleeding.  Several little  vessels anteriorly were clipped and cauterized distally.  Then a clip was  placed across the junction of the cystic junction of the gallbladder.  A  little opening was made in the proximal cystic duct.  A  Cook catheter  introduced and held in place with a clip.  The cholangiogram was obtained,  which showed prompt filling of the extra-hepatic biliary flow and good flow  into the duodenum, and a little cystic duct about 2 cm in length.  The  catheter was removed.  The cystic duct was triply clipped and divided.  We  carefully dissected the gallbladder from the __________ hepatic bed.  Good  hemostasis was obtained with the cautery.  The gallbladder was placed in an  EndoCatch bag.  You could smell bile, probably bacteria, and we cultured the  gallbladder when it was on the back table in the EndoCatch bag.  We  thoroughly irrigated.  Reinspected.  There was Paul bile.  The irrigating  fluid was aspirated.  We then had removed the bag at the umbilicus and had  reinserted the Hasson cannula .  We discussed possibly putting a drain.  Did  not think it was needed since there was Paul bile leaking, and we aspirated  all of the fluid.  The Hasson cannula  was removed . An additional figure-of-  eight 0 Vicryl was placed with the T5 needle, and both sutures were tied,  and then the fascia at the umbilicus was anesthetized with Marcaine.  The 5  mm ports were withdrawn after the remaining irrigating fluid had been  aspirated.  Carbon dioxide released, and the upper 10 mm trocar withdrawn  under direct vision.  The subcutaneous wounds were closed with 4-0 Vicryl  and Benzoin and Steri-Strips on the skin.  The patient tolerated the  procedure nicely.  Was breathing spontaneously at the completion and was  sent to the recovery room in a stable postoperative condition.  We will give  him a couple of doses of Unasyn postoperatively.  We will be checking the  Gram's stain or culture in the morning, and we will probably release him on  48-hours of ampicillin.  The patient hopefully will not be too nauseated  tonight and will be offered clear liquids.           ______________________________ Anselm Pancoast.  Zachery Dakins, M.D.     WJW/MEDQ  D:  08/01/2005  T:  08/01/2005  Job:  [id number]   cc:   Dr. Roseanne [facility]

## 2011-05-01 ENCOUNTER — Encounter: Payer: Self-pay | Admitting: Cardiology

## 2011-05-08 ENCOUNTER — Telehealth: Payer: Self-pay | Admitting: Cardiology

## 2011-05-08 DIAGNOSIS — E785 Hyperlipidemia, unspecified: Secondary | ICD-10-CM

## 2011-05-08 NOTE — Telephone Encounter (Signed)
Pt's appt is Friday.  Will need to obtain orders for any lab work needed from Dr Daleen Squibb.  Will forward message to him and Mylo Red, RN for review.

## 2011-05-08 NOTE — Telephone Encounter (Signed)
Pt wants to know if he needs lab work before appt

## 2011-05-09 NOTE — Telephone Encounter (Signed)
Pt calls and requested lab work prior to visit with Dr. Daleen Squibb on Friday. Mylo Red RN

## 2011-05-10 ENCOUNTER — Other Ambulatory Visit (INDEPENDENT_AMBULATORY_CARE_PROVIDER_SITE_OTHER): Payer: 59 | Admitting: *Deleted

## 2011-05-10 DIAGNOSIS — E785 Hyperlipidemia, unspecified: Secondary | ICD-10-CM

## 2011-05-10 LAB — LIPID PANEL
Cholesterol: 133 mg/dL (ref 0–200)
HDL: 44 mg/dL (ref >39.00)
LDL Cholesterol: 77 mg/dL (ref 0–99)
Total CHOL/HDL Ratio: 3
Triglycerides: 61 mg/dL (ref 0.0–149.0)
VLDL: 12.2 mg/dL (ref 0.0–40.0)

## 2011-05-10 LAB — HEPATIC FUNCTION PANEL
ALT: 35 U/L (ref 0–53)
AST: 28 U/L (ref 0–37)
Albumin: 4 g/dL (ref 3.5–5.2)
Alkaline Phosphatase: 55 U/L (ref 39–117)
Bilirubin, Direct: 0.1 mg/dL (ref 0.0–0.3)
Total Bilirubin: 0.5 mg/dL (ref 0.3–1.2)
Total Protein: 7.4 g/dL (ref 6.0–8.3)

## 2011-05-10 LAB — BASIC METABOLIC PANEL
BUN: 26 mg/dL — ABNORMAL HIGH (ref 6–23)
CO2: 26 mEq/L (ref 19–32)
Calcium: 8.9 mg/dL (ref 8.4–10.5)
Chloride: 101 mEq/L (ref 96–112)
Creatinine, Ser: 1 mg/dL (ref 0.4–1.5)
GFR: 81.4 mL/min (ref >60.00)
Glucose, Bld: 118 mg/dL — ABNORMAL HIGH (ref 70–99)
Potassium: 4.4 mEq/L (ref 3.5–5.1)
Sodium: 138 mEq/L (ref 135–145)

## 2011-05-11 ENCOUNTER — Ambulatory Visit: Payer: Self-pay | Admitting: Cardiology

## 2011-06-06 ENCOUNTER — Other Ambulatory Visit: Payer: Self-pay | Admitting: Cardiology

## 2011-06-14 ENCOUNTER — Encounter: Payer: Self-pay | Admitting: Cardiology

## 2011-06-14 ENCOUNTER — Ambulatory Visit (INDEPENDENT_AMBULATORY_CARE_PROVIDER_SITE_OTHER): Payer: 59 | Admitting: Cardiology

## 2011-06-14 VITALS — BP 109/75 | HR 63 | Ht 70.0 in | Wt 229.0 lb

## 2011-06-14 DIAGNOSIS — Z8679 Personal history of other diseases of the circulatory system: Secondary | ICD-10-CM

## 2011-06-14 DIAGNOSIS — E785 Hyperlipidemia, unspecified: Secondary | ICD-10-CM

## 2011-06-14 DIAGNOSIS — I2510 Atherosclerotic heart disease of native coronary artery without angina pectoris: Secondary | ICD-10-CM

## 2011-06-14 DIAGNOSIS — I10 Essential (primary) hypertension: Secondary | ICD-10-CM

## 2011-06-14 MED ORDER — ASPIRIN 81 MG PO TBEC: 81.0000 mg | DELAYED_RELEASE_TABLET | Freq: Every day | ORAL | Status: AC

## 2011-06-14 MED ORDER — NITROGLYCERIN 0.4 MG SL SUBL: 0.4000 mg | SUBLINGUAL_TABLET | SUBLINGUAL | Status: DC | PRN

## 2011-06-14 NOTE — Patient Instructions (Signed)
Your physician has recommended you make the following change in your medication: decrease aspirin to 81mg daily Your physician recommends that you schedule a follow-up appointment in: 1 year with Dr. Wall  

## 2011-06-14 NOTE — Progress Notes (Signed)
HPI Paul Rubio returns for evaluation and management of his coronary disease, history of hypertension, and hyperlipidemia.   He is having no symptoms of angina or ischemia. 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  Recent lipids reviewed and look remarkably good. Therapy is maximized.  EKG today shows normal sinus rhythm with no ST segment changes. Past Medical History  Diagnosis Date  . Hypertension     for approximately 5 years  . Hyperlipidemia     borderline; status post stress test x2, the last one 5-6 years ago and without significant abnormality per the patient    Past Surgical History  Procedure Date  . Cholecystectomy   . Colonoscopy     Family History  Problem Relation Age of Onset  . Heart disease Father     History   Social History  . Marital Status: Married    Spouse Name: N/A    Number of Children: N/A  . Years of Education: N/A   Occupational History  . Not on file.   Social History Main Topics  . Smoking status: Former Smoker    Types: Cigarettes    Quit date: 12/03/1968  . Smokeless tobacco: Not on file  . Alcohol Use: No  . Drug Use: No  . Sexually Active: Not on file   Other Topics Concern  . Not on file   Social History Narrative   Quit tobacco greater than 20 years ago with approximately 10 pack-year history. Denies alcohol or drug abuse    No Known Allergies  Current Outpatient Prescriptions  Medication Sig Dispense Refill  . Cholecalciferol (VITAMIN D3) 2000 UNITS capsule Take 2,000 Units by mouth 2 (two) times daily.        . CRESTOR 40 MG tablet TAKE ONE TABLET BY MOUTH DAILY  30 tablet  0  . fexofenadine (ALLEGRA) 180 MG tablet Take 180 mg by mouth daily.        . Flaxseed, Linseed, (FLAX SEED OIL) 1000 MG CAPS Take 1,000 mg by mouth. 2 tabs two times a day       . metoprolol tartrate (LOPRESSOR) 25 MG tablet Take 25 mg by mouth. Take 1/2 tab two times daily       . multivitamin (THERAGRAN) per tablet Take 1 tablet by  mouth daily.        . nitroGLYCERIN (NITROSTAT) 0.4 MG SL tablet Place 1 tablet (0.4 mg total) under the tongue every 5 (five) minutes as needed.  90 tablet  3  . Omega-3 Fatty Acids (FISH OIL) 1000 MG CAPS Take 1,000 mg by mouth. 2 caps two times a day       . ranitidine (ZANTAC 75) 75 MG tablet Take 75 mg by mouth daily.        Marland Kitchen aspirin 81 MG EC tablet Take 1 tablet (81 mg total) by mouth daily.      . clopidogrel (PLAVIX) 75 MG tablet Take 75 mg by mouth daily.       Marland Kitchen olmesartan (BENICAR) 20 MG tablet Take 20 mg by mouth daily.          ROS Negative other than HPI.   PE General Appearance: well developed, well nourished in no acute distress, overweight HEENT: symmetrical face, PERRLA, good dentition  Neck: no JVD, thyromegaly, or adenopathy, trachea midline Chest: symmetric without deformity Cardiac: PMI non-displaced, RRR, normal S1, S2, no gallop or murmur Lung: clear to ausculation and percussion Vascular: all pulses full without bruits  Abdominal:  nondistended, nontender, good bowel sounds, no HSM, no bruits Extremities: no cyanosis, clubbing or edema, no sign of DVT, no varicosities  Skin: normal color, no rashes Neuro: alert and oriented x 3, non-focal Pysch: normal affect Filed Vitals:   06/14/11 1048  BP: 109/75  Pulse: 63  Height: 5\' 10"  (1.778 m)  Weight: 229 lb (103.874 kg)    EKG  Labs and Studies Reviewed.   Lab Results  Component Value Date   WBC 10.3 05/16/2010   HGB 14.8 05/16/2010   HCT 44.0 05/16/2010   MCV 92.9 05/16/2010   PLT 218 05/16/2010      Chemistry      Component Value Date/Time   NA 138 05/10/2011 1030   K 4.4 05/10/2011 1030   CL 101 05/10/2011 1030   CO2 26 05/10/2011 1030   BUN 26* 05/10/2011 1030   CREATININE 1.0 05/10/2011 1030      Component Value Date/Time   CALCIUM 8.9 05/10/2011 1030   ALKPHOS 55 05/10/2011 1030   AST 28 05/10/2011 1030   ALT 35 05/10/2011 1030   BILITOT 0.5 05/10/2011 1030       Lab Results  Component Value Date    CHOL 133 05/10/2011   CHOL 120 09/19/2010   CHOL 95 07/25/2010   Lab Results  Component Value Date   HDL 44.00 05/10/2011   HDL 39.20 09/19/2010   HDL 31.60* 07/25/2010   Lab Results  Component Value Date   LDLCALC 77 05/10/2011   LDLCALC 66 09/19/2010   LDLCALC 46 07/25/2010   Lab Results  Component Value Date   TRIG 61.0 05/10/2011   TRIG 72.0 09/19/2010   TRIG 88.0 07/25/2010   Lab Results  Component Value Date   CHOLHDL 3 05/10/2011   CHOLHDL 3 09/19/2010   CHOLHDL 3 07/25/2010   Lab Results  Component Value Date   HGBA1C  Value: 6.3 (NOTE)                                                                       According to the ADA Clinical Practice Recommendations for 2011, when HbA1c is used as a screening test:   >=6.5%   Diagnostic of Diabetes Mellitus           (if abnormal result  is confirmed)  5.7-6.4%   Increased risk of developing Diabetes Mellitus  References:Diagnosis and Classification of Diabetes Mellitus,Diabetes Care,2011,34(Suppl 1):S62-S69 and Standards of Medical Care in         Diabetes - 2011,Diabetes Care,2011,34  (Suppl 1):S11-S61.* 05/12/2010   Lab Results  Component Value Date   ALT 35 05/10/2011   AST 28 05/10/2011   ALKPHOS 55 05/10/2011   BILITOT 0.5 05/10/2011

## 2011-06-14 NOTE — Assessment & Plan Note (Signed)
Stable. Continue current medications but have reduced his aspirin to 81 mg a day.

## 2011-06-14 NOTE — Assessment & Plan Note (Signed)
What is the gold except for an LDL 77 the HDL 44 with total cholesterol to HDL ratio of 3. No change in therapy.

## 2011-06-14 NOTE — Assessment & Plan Note (Signed)
Remarkably good.

## 2011-08-07 ENCOUNTER — Other Ambulatory Visit: Payer: Self-pay | Admitting: Cardiology

## 2011-10-07 ENCOUNTER — Telehealth: Payer: Self-pay | Admitting: Cardiology

## 2011-10-09 ENCOUNTER — Other Ambulatory Visit: Payer: Self-pay | Admitting: *Deleted

## 2011-10-09 MED ORDER — ROSUVASTATIN CALCIUM 40 MG PO TABS: 40.0000 mg | ORAL_TABLET | Freq: Every day | ORAL | Status: DC

## 2011-10-11 ENCOUNTER — Other Ambulatory Visit: Payer: Self-pay

## 2011-10-11 MED ORDER — ROSUVASTATIN CALCIUM 40 MG PO TABS: 40.0000 mg | ORAL_TABLET | Freq: Every day | ORAL | Status: DC

## 2011-10-11 MED ORDER — CLOPIDOGREL BISULFATE 75 MG PO TABS: 75.0000 mg | ORAL_TABLET | Freq: Every day | ORAL | Status: DC

## 2011-10-11 MED ORDER — METOPROLOL TARTRATE 25 MG PO TABS: 12.5000 mg | ORAL_TABLET | Freq: Two times a day (BID) | ORAL | Status: DC

## 2011-10-11 NOTE — Telephone Encounter (Signed)
Pt returning your call please call °

## 2011-10-11 NOTE — Telephone Encounter (Signed)
Refilled

## 2011-10-11 NOTE — Telephone Encounter (Signed)
Pt wife calling wanting one year supply of crestor, plavix, metroprolol. Pt said every time he gets refills RX says no refills. Pt would like RX's called in with one year supply of medications.   Pt doesn't need refills at this time but wants for next months RX to have refills for one year.

## 2011-10-11 NOTE — Telephone Encounter (Signed)
Paul Rubio was notified that the refills were done

## 2012-02-12 ENCOUNTER — Encounter: Payer: Self-pay | Admitting: Cardiology

## 2012-04-02 ENCOUNTER — Telehealth: Payer: Self-pay | Admitting: Cardiology

## 2012-04-02 NOTE — Telephone Encounter (Signed)
Pt calling re recall for July , does he need blood work

## 2012-04-02 NOTE — Telephone Encounter (Signed)
I spoke with pt and he has had recent labs with his pcp.   July appt made with Dr. Lonia Chimera RN

## 2012-05-23 ENCOUNTER — Other Ambulatory Visit: Payer: Self-pay | Admitting: *Deleted

## 2012-05-23 ENCOUNTER — Encounter: Payer: Self-pay | Admitting: *Deleted

## 2012-06-04 ENCOUNTER — Ambulatory Visit (INDEPENDENT_AMBULATORY_CARE_PROVIDER_SITE_OTHER): Payer: 59 | Admitting: Cardiology

## 2012-06-04 ENCOUNTER — Encounter: Payer: Self-pay | Admitting: Cardiology

## 2012-06-04 VITALS — BP 120/82 | HR 63 | Ht 70.0 in | Wt 224.0 lb

## 2012-06-04 DIAGNOSIS — I10 Essential (primary) hypertension: Secondary | ICD-10-CM

## 2012-06-04 DIAGNOSIS — I2510 Atherosclerotic heart disease of native coronary artery without angina pectoris: Secondary | ICD-10-CM

## 2012-06-04 DIAGNOSIS — E785 Hyperlipidemia, unspecified: Secondary | ICD-10-CM

## 2012-06-04 MED ORDER — NITROGLYCERIN 0.4 MG SL SUBL: 0.4000 mg | SUBLINGUAL_TABLET | SUBLINGUAL | Status: DC | PRN

## 2012-06-04 MED ORDER — METOPROLOL TARTRATE 25 MG PO TABS: 12.5000 mg | ORAL_TABLET | Freq: Two times a day (BID) | ORAL | Status: DC

## 2012-06-04 MED ORDER — ROSUVASTATIN CALCIUM 10 MG PO TABS: 10.0000 mg | ORAL_TABLET | Freq: Every day | ORAL | Status: DC

## 2012-06-04 MED ORDER — CLOPIDOGREL BISULFATE 75 MG PO TABS: 75.0000 mg | ORAL_TABLET | Freq: Every day | ORAL | Status: DC

## 2012-06-04 NOTE — Patient Instructions (Addendum)
Your physician recommends that you continue on your current medications as directed. Please refer to the Current Medication list given to you today.  Your physician has recommended you make the following change in your medication: Decrease Crestor to 10mg   At bedtime daily.  Your physician wants you to follow-up in: 1 year. You will receive a reminder letter in the mail two months in advance. If you don't receive a letter, please call our office to schedule the follow-up appointment.

## 2012-06-04 NOTE — Assessment & Plan Note (Signed)
Stable. Continue aggressive secondary preventative therapy. 

## 2012-06-04 NOTE — Progress Notes (Signed)
HPI Paul Rubio comes in today for evaluation and management of his history of a non-ST segment elevation MI and coronary artery disease. I'm also following him for hypertension and hyperlipidemia.  He is totally asymptomatic with no angina or ischemic symptoms. He had recent blood work done by his primary care showed a total cholesterol of 107. His wife wonders if his cholesterol is too low. He does complain of chronic fatigue and also falls asleep very easily. I note his hemoglobin A1c was 6.3%. I suspect its postprandial hyperglycemia.  Past Medical History  Diagnosis Date  . Hypertension     for approximately 5 years  . Hyperlipidemia     borderline; status post stress test x2, the last one 5-6 years ago and without significant abnormality per the patient    Current Outpatient Prescriptions  Medication Sig Dispense Refill  . aspirin 81 MG EC tablet Take 1 tablet (81 mg total) by mouth daily.      . Cholecalciferol (VITAMIN D3) 2000 UNITS capsule Take 2,000 Units by mouth 2 (two) times daily.        . clopidogrel (PLAVIX) 75 MG tablet Take 1 tablet (75 mg total) by mouth daily.  30 tablet  9  . fexofenadine (ALLEGRA) 180 MG tablet Take 180 mg by mouth daily.        . Flaxseed, Linseed, (FLAX SEED OIL) 1000 MG CAPS Take 1,000 mg by mouth. 2 tabs two times a day       . metoprolol tartrate (LOPRESSOR) 25 MG tablet Take 0.5 tablets (12.5 mg total) by mouth 2 (two) times daily. Take 1/2 tab two times daily  30 tablet  9  . multivitamin (THERAGRAN) per tablet Take 1 tablet by mouth daily.        . nitroGLYCERIN (NITROSTAT) 0.4 MG SL tablet Place 1 tablet (0.4 mg total) under the tongue every 5 (five) minutes as needed.  90 tablet  3  . olmesartan (BENICAR) 20 MG tablet Take 20 mg by mouth daily.        . Omega-3 Fatty Acids (FISH OIL) 1000 MG CAPS Take 1,000 mg by mouth. 2 caps two times a day       . ranitidine (ZANTAC 75) 75 MG tablet Take 75 mg by mouth daily.        . rosuvastatin  (CRESTOR) 40 MG tablet Take 1 tablet (40 mg total) by mouth daily.  30 tablet  9    No Known Allergies  Family History  Problem Relation Age of Onset  . Heart disease Father   . Heart attack    . Sudden death Paternal Uncle     possible MI    History   Social History  . Marital Status: Married    Spouse Name: N/A    Number of Children: N/A  . Years of Education: N/A   Occupational History  . Not on file.   Social History Main Topics  . Smoking status: Former Smoker    Types: Cigarettes    Quit date: 12/03/1968  . Smokeless tobacco: Not on file  . Alcohol Use: No  . Drug Use: No  . Sexually Active: Not on file   Other Topics Concern  . Not on file   Social History Narrative   Quit tobacco greater than 20 years ago with approximately 10 pack-year history. Denies alcohol or drug abuse    ROS ALL NEGATIVE EXCEPT THOSE NOTED IN HPI  PE  General Appearance: well developed, well nourished  in no acute distress, muscular overweight HEENT: symmetrical face, PERRLA, good dentition  Neck: no JVD, thyromegaly, or adenopathy, trachea midline Chest: symmetric without deformity Cardiac: PMI non-displaced, RRR, normal S1, S2, no gallop or murmur Lung: clear to ausculation and percussion Vascular: all pulses full without bruits  Abdominal: nondistended, nontender, good bowel sounds, no HSM, no bruits Extremities: no cyanosis, clubbing or edema, no sign of DVT, no varicosities  Skin: normal color, no rashes Neuro: alert and oriented x 3, non-focal Pysch: normal affect  EKG Normal sinus rhythm, normal EKG BMET    Component Value Date/Time   NA 138 05/10/2011 1030   K 4.4 05/10/2011 1030   CL 101 05/10/2011 1030   CO2 26 05/10/2011 1030   GLUCOSE 118* 05/10/2011 1030   BUN 26* 05/10/2011 1030   CREATININE 1.0 05/10/2011 1030   CALCIUM 8.9 05/10/2011 1030   GFRNONAA >60 05/16/2010 0550   GFRAA  Value: >60        The eGFR has been calculated using the MDRD equation. This calculation  has not been validated in all clinical situations. eGFR's persistently <60 mL/min signify possible Chronic Kidney Disease. 05/16/2010 0550    Lipid Panel     Component Value Date/Time   CHOL 133 05/10/2011 1030   TRIG 61.0 05/10/2011 1030   HDL 44.00 05/10/2011 1030   CHOLHDL 3 05/10/2011 1030   VLDL 12.2 05/10/2011 1030   LDLCALC 77 05/10/2011 1030    CBC    Component Value Date/Time   WBC 10.3 05/16/2010 0550   RBC 4.74 05/16/2010 0550   HGB 14.8 05/16/2010 0550   HCT 44.0 05/16/2010 0550   PLT 218 05/16/2010 0550   MCV 92.9 05/16/2010 0550   MCHC 33.5 05/16/2010 0550   RDW 13.3 05/16/2010 0550   LYMPHSABS 1.7 05/12/2010 1403   MONOABS 0.6 05/12/2010 1403   EOSABS 0.1 05/12/2010 1403   BASOSABS 0.0 05/12/2010 1403

## 2012-06-04 NOTE — Assessment & Plan Note (Signed)
Good control

## 2012-06-04 NOTE — Assessment & Plan Note (Signed)
We'll decrease his Crestor from 20 mg a day to 10 mg a day. That she keep his total cholesterol at 150 or less his LDL less than 70.

## 2013-01-26 ENCOUNTER — Other Ambulatory Visit: Payer: Self-pay | Admitting: Dermatology

## 2013-06-15 ENCOUNTER — Other Ambulatory Visit: Payer: Self-pay | Admitting: *Deleted

## 2013-06-15 MED ORDER — CLOPIDOGREL BISULFATE 75 MG PO TABS: 75.0000 mg | ORAL_TABLET | Freq: Every day | ORAL | Status: DC

## 2013-06-29 ENCOUNTER — Other Ambulatory Visit: Payer: Self-pay | Admitting: *Deleted

## 2013-06-29 MED ORDER — METOPROLOL TARTRATE 25 MG PO TABS: 12.5000 mg | ORAL_TABLET | Freq: Two times a day (BID) | ORAL | Status: DC

## 2013-06-29 MED ORDER — ROSUVASTATIN CALCIUM 10 MG PO TABS: 10.0000 mg | ORAL_TABLET | Freq: Every day | ORAL | Status: DC

## 2013-06-29 NOTE — Telephone Encounter (Signed)
REFILL SENT 

## 2013-06-29 NOTE — Telephone Encounter (Signed)
Refill sent.

## 2013-07-20 ENCOUNTER — Telehealth: Payer: Self-pay | Admitting: Cardiology

## 2013-07-20 DIAGNOSIS — I2510 Atherosclerotic heart disease of native coronary artery without angina pectoris: Secondary | ICD-10-CM

## 2013-07-20 NOTE — Telephone Encounter (Signed)
Pt's wife calling re blood work she thinks he needs, has appt Wednesday, wants to come in today for blood work so results will be ready by appt, no order in system, call asap in case fasting lab and let wife know when order ready  [phone]

## 2013-07-20 NOTE — Addendum Note (Signed)
Addended by: Dossie Arbour on: 07/20/2013 04:10 PM   Modules accepted: Orders

## 2013-07-20 NOTE — Telephone Encounter (Signed)
Spoke with pt's wife who is asking if pt needs lab work prior to appt with Dr. Daleen Squibb on July 22, 2013. Lipids and liver profiles previously checked by Dr. Oneta Rack in primary care. Wife reports pt is now seeing Dr. Tanya Nones at [facility]. She is not sure if pt has had recent lab work checked. She will contact Dr. Caren Macadam office and ask them to fax any recent labs to our office. These will be sent to the triage nurse.  Wife is requesting these be reviewed and we then contact her and let her know if pt should come in for labs tomorrow.

## 2013-07-20 NOTE — Telephone Encounter (Signed)
Received message from Dr. Daleen Squibb that he would like pt to have lab work-  fasting lipids, comprehensive metabolic profile, CBC and hemoglobin A1c. I spoke with pt's wife and gave her this information. Pt will come in tomorrow for fasting labs.

## 2013-07-20 NOTE — Telephone Encounter (Signed)
Labs received. Lipid profile and complete metabolic profile checked in December 2013. Pt reports he has not had any changes in cholesterol medication since this lab checked.  I told him he did not need lab work prior to appt on Wednesday with Dr. Daleen Squibb and we would have labs available for Dr. Daleen Squibb to review at pt's office visit.

## 2013-07-21 ENCOUNTER — Other Ambulatory Visit (INDEPENDENT_AMBULATORY_CARE_PROVIDER_SITE_OTHER): Payer: 59

## 2013-07-21 DIAGNOSIS — I2510 Atherosclerotic heart disease of native coronary artery without angina pectoris: Secondary | ICD-10-CM

## 2013-07-21 LAB — LIPID PANEL
Cholesterol: 117 mg/dL (ref 0–200)
HDL: 39.5 mg/dL (ref >39.00)
LDL Cholesterol: 57 mg/dL (ref 0–99)
Total CHOL/HDL Ratio: 3
Triglycerides: 103 mg/dL (ref 0.0–149.0)
VLDL: 20.6 mg/dL (ref 0.0–40.0)

## 2013-07-21 LAB — CBC WITH DIFFERENTIAL/PLATELET
Basophils Absolute: 0 10*3/uL (ref 0.0–0.1)
Basophils Relative: 0.3 % (ref 0.0–3.0)
Eosinophils Absolute: 0.1 10*3/uL (ref 0.0–0.7)
Eosinophils Relative: 1.7 % (ref 0.0–5.0)
HCT: 42.6 % (ref 39.0–52.0)
Hemoglobin: 14.4 g/dL (ref 13.0–17.0)
Lymphocytes Relative: 21.3 % (ref 12.0–46.0)
Lymphs Abs: 1.9 10*3/uL (ref 0.7–4.0)
MCHC: 33.8 g/dL (ref 30.0–36.0)
MCV: 91.8 fl (ref 78.0–100.0)
Monocytes Absolute: 0.6 10*3/uL (ref 0.1–1.0)
Monocytes Relative: 6.2 % (ref 3.0–12.0)
Neutro Abs: 6.3 10*3/uL (ref 1.4–7.7)
Neutrophils Relative %: 70.5 % (ref 43.0–77.0)
Platelets: 245 10*3/uL (ref 150.0–400.0)
RBC: 4.64 Mil/uL (ref 4.22–5.81)
RDW: 12.7 % (ref 11.5–14.6)
WBC: 9 10*3/uL (ref 4.5–10.5)

## 2013-07-21 LAB — BASIC METABOLIC PANEL
BUN: 25 mg/dL — ABNORMAL HIGH (ref 6–23)
CO2: 26 mEq/L (ref 19–32)
Calcium: 9.2 mg/dL (ref 8.4–10.5)
Chloride: 104 mEq/L (ref 96–112)
Creatinine, Ser: 1.1 mg/dL (ref 0.4–1.5)
GFR: 73.89 mL/min (ref >60.00)
Glucose, Bld: 106 mg/dL — ABNORMAL HIGH (ref 70–99)
Potassium: 4.2 mEq/L (ref 3.5–5.1)
Sodium: 137 mEq/L (ref 135–145)

## 2013-07-21 LAB — HEPATIC FUNCTION PANEL
ALT: 38 U/L (ref 0–53)
AST: 36 U/L (ref 0–37)
Albumin: 4 g/dL (ref 3.5–5.2)
Alkaline Phosphatase: 47 U/L (ref 39–117)
Bilirubin, Direct: 0 mg/dL (ref 0.0–0.3)
Total Bilirubin: 0.6 mg/dL (ref 0.3–1.2)
Total Protein: 7.5 g/dL (ref 6.0–8.3)

## 2013-07-21 LAB — HEMOGLOBIN A1C: Hgb A1c MFr Bld: 6.3 % (ref 4.6–6.5)

## 2013-07-22 ENCOUNTER — Ambulatory Visit (INDEPENDENT_AMBULATORY_CARE_PROVIDER_SITE_OTHER): Payer: 59 | Admitting: Cardiology

## 2013-07-22 ENCOUNTER — Encounter: Payer: Self-pay | Admitting: Cardiology

## 2013-07-22 VITALS — BP 138/82 | HR 79 | Ht 70.0 in | Wt 225.0 lb

## 2013-07-22 DIAGNOSIS — I2510 Atherosclerotic heart disease of native coronary artery without angina pectoris: Secondary | ICD-10-CM

## 2013-07-22 DIAGNOSIS — E785 Hyperlipidemia, unspecified: Secondary | ICD-10-CM

## 2013-07-22 DIAGNOSIS — Z8679 Personal history of other diseases of the circulatory system: Secondary | ICD-10-CM

## 2013-07-22 DIAGNOSIS — I10 Essential (primary) hypertension: Secondary | ICD-10-CM

## 2013-07-22 NOTE — Progress Notes (Signed)
HPI Rossi comes in today for evaluation and management of his history of a non-STEMI requiring urgent stenting x2 of his right coronary artery. Occurred on 05/15/2010. 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  Done unremarkably well since then. He continues to work 12-14 hour days. His only complaint is fatigue at the end of the day. Compliant with his medications. He has maintained his weight which he lost 15-20 pounds after his heart attack.  I drew lab work yesterday. His hemoglobin A1c is 6.3%. His troponins metabolic profile is normal except for minimal elevation in LFTs. His lipids are at goal except for low HDL of 39.  Past Medical History  Diagnosis Date  . Hypertension     for approximately 5 years  . Hyperlipidemia     borderline; status post stress test x2, the last one 5-6 years ago and without significant abnormality per the patient    Current Outpatient Prescriptions  Medication Sig Dispense Refill  . Cholecalciferol (VITAMIN D3) 2000 UNITS capsule Take 2,000 Units by mouth 2 (two) times daily.        . clopidogrel (PLAVIX) 75 MG tablet Take 1 tablet (75 mg total) by mouth daily.  90 tablet  3  . fexofenadine (ALLEGRA) 180 MG tablet Take 180 mg by mouth daily.        . metoprolol tartrate (LOPRESSOR) 25 MG tablet Take 0.5 tablets (12.5 mg total) by mouth 2 (two) times daily.  90 tablet  3  . multivitamin (THERAGRAN) per tablet Take 1 tablet by mouth daily.        . nitroGLYCERIN (NITROSTAT) 0.4 MG SL tablet Place 1 tablet (0.4 mg total) under the tongue every 5 (five) minutes as needed.  90 tablet  3  . olmesartan (BENICAR) 20 MG tablet Take 20 mg by mouth daily.        . Omega-3 Fatty Acids (FISH OIL) 1000 MG CAPS Take 1,000 mg by mouth. 2 caps two times a day       . ranitidine (ZANTAC 75) 75 MG tablet Take 75 mg by mouth daily.        . rosuvastatin (CRESTOR) 10 MG tablet Take 1 tablet (10 mg total) by mouth daily.  30 tablet  6   No current  facility-administered medications for this visit.    No Known Allergies  Family History  Problem Relation Age of Onset  . Heart disease Father   . Heart attack    . Sudden death Paternal Uncle     possible MI    History   Social History  . Marital Status: Married    Spouse Name: N/A    Number of Children: N/A  . Years of Education: N/A   Occupational History  . Not on file.   Social History Main Topics  . Smoking status: Former Smoker    Types: Cigarettes    Quit date: 12/03/1968  . Smokeless tobacco: Not on file  . Alcohol Use: No  . Drug Use: No  . Sexual Activity: Not on file   Other Topics Concern  . Not on file   Social History Narrative   Quit tobacco greater than 20 years ago with approximately 10 pack-year history. Denies alcohol or drug abuse    ROS ALL NEGATIVE EXCEPT THOSE NOTED IN HPI  PE  General Appearance: well developed, well nourished in no acute distress, muscular HEENT: symmetrical face, PERRLA, good dentition  Neck: no JVD, thyromegaly, or adenopathy, trachea midline Chest:  symmetric without deformity Cardiac: PMI non-displaced, RRR, normal S1, S2, no gallop or murmur Lung: clear to ausculation and percussion Vascular: all pulses full without bruits  Abdominal: nondistended, nontender, good bowel sounds, no HSM, no bruits Extremities: no cyanosis, clubbing or edema, no sign of DVT, no varicosities  Skin: normal color, no rashes Neuro: alert and oriented x 3, non-focal Pysch: normal affect  EKG Normal sinus rhythm, normal EKG BMET    Component Value Date/Time   NA 137 07/21/2013 1024   K 4.2 07/21/2013 1024   CL 104 07/21/2013 1024   CO2 26 07/21/2013 1024   GLUCOSE 106* 07/21/2013 1024   BUN 25* 07/21/2013 1024   CREATININE 1.1 07/21/2013 1024   CALCIUM 9.2 07/21/2013 1024   GFRNONAA >60 05/16/2010 0550   GFRAA  Value: >60        The eGFR has been calculated using the MDRD equation. This calculation has not been validated in all  clinical situations. eGFR's persistently <60 mL/min signify possible Chronic Kidney Disease. 05/16/2010 0550    Lipid Panel     Component Value Date/Time   CHOL 117 07/21/2013 1024   TRIG 103.0 07/21/2013 1024   HDL 39.50 07/21/2013 1024   CHOLHDL 3 07/21/2013 1024   VLDL 20.6 07/21/2013 1024   LDLCALC 57 07/21/2013 1024    CBC    Component Value Date/Time   WBC 9.0 07/21/2013 1024   RBC 4.64 07/21/2013 1024   HGB 14.4 07/21/2013 1024   HCT 42.6 07/21/2013 1024   PLT 245.0 07/21/2013 1024   MCV 91.8 07/21/2013 1024   MCHC 33.8 07/21/2013 1024   RDW 12.7 07/21/2013 1024   LYMPHSABS 1.9 07/21/2013 1024   MONOABS 0.6 07/21/2013 1024   EOSABS 0.1 07/21/2013 1024   BASOSABS 0.0 07/21/2013 1024

## 2013-07-22 NOTE — Patient Instructions (Addendum)
Your physician wants you to follow-up in: 1 YEAR WITH DR. COOPER You will receive a reminder letter in the mail two months in advance. If you don't receive a letter, please call our office to schedule the follow-up appointment.  Your physician recommends that you continue on your current medications as directed. Please refer to the Current Medication list given to you today.  

## 2013-07-22 NOTE — Assessment & Plan Note (Signed)
Stable. Continue secondary preventative therapy. I'll have him return in one year to see Dr. Excell Seltzer.

## 2013-07-22 NOTE — Assessment & Plan Note (Signed)
Lipids are at goal except for low HDL. No change in current medical program. Repeat in one year.

## 2013-08-25 ENCOUNTER — Other Ambulatory Visit: Payer: Self-pay | Admitting: Family Medicine

## 2013-09-10 ENCOUNTER — Telehealth: Payer: Self-pay | Admitting: Cardiology

## 2013-09-10 NOTE — Telephone Encounter (Signed)
Called office and asked her to please change name from Dr Daleen Squibb to Dr Excell Seltzer and to refax.  Agreed to plan.

## 2013-09-10 NOTE — Telephone Encounter (Signed)
New problem   Waiting on cardiac clearance that was faxed on 09/03/13 please advise.

## 2013-09-15 NOTE — Telephone Encounter (Signed)
I spoke with Selena Batten and she has been trying to obtain cardiac clearance for this patient since September.  I found the clearance request that was faxed on 09/03/13 in Dr Vern Claude in box. The pt is pending shoulder arthroscopy, debridement, open biceps tenodesis and rotator cuff repair by Dr Dorene Grebe. The pt needs clearance and plavix addressed. Response can be faxed to Kim at [phone].  I will forward this message to Dr Excell Seltzer to review.

## 2013-09-15 NOTE — Telephone Encounter (Signed)
Follow up   Please advise for cardiac clearance.

## 2013-09-17 NOTE — Telephone Encounter (Signed)
Per Dr Excell Seltzer the pt can proceed with surgery from a cardiac standpoint.  The pt can hold Plavix 5 days prior to procedure and should resume ASAP when safe from a surgical stand point.  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  The pt denies chest pain. I will fax this message to [facility].

## 2013-09-23 ENCOUNTER — Other Ambulatory Visit: Payer: Self-pay | Admitting: Orthopedic Surgery

## 2013-10-08 ENCOUNTER — Encounter (HOSPITAL_COMMUNITY): Payer: Self-pay | Admitting: Pharmacy Technician

## 2013-10-10 NOTE — Pre-Procedure Instructions (Signed)
INDIGO CHADDOCK  10/10/2013   Your procedure is scheduled on:  November 18  Report to [facility] Entrance "A" [address] at [employer] AM.  Call this number if you have problems the morning of surgery: [phone]   Remember:   Do not eat food or drink liquids after midnight.   Take these medicines the morning of surgery with A SIP OF WATER: Metoprolol   STOP Plavix, Aspirin, Multiple Vitamins, Fish Oil November 11   STOP Aspirin, Aleve, Naproxen, Advil, Ibuprofen, Vitamin, Herbs, or Supplements starting November 11   Do not wear jewelry, make-up or nail polish.  Do not wear lotions, powders, or perfumes. You may wear deodorant.  Do not shave 48 hours prior to surgery. Men may shave face and neck.  Do not bring valuables to the hospital.  [facility] is not responsible                  for any belongings or valuables.               Contacts, dentures or bridgework may not be worn into surgery.  Leave suitcase in the car. After surgery it may be brought to your room.  For patients admitted to the hospital, discharge time is determined by your                treatment team.               Special Instructions: Shower using CHG 2 nights before surgery and the night before surgery.  If you shower the day of surgery use CHG.  Use special wash - you have one bottle of CHG for all showers.  You should use approximately 1/3 of the bottle for each shower.   Please read over the following fact sheets that you were given: Pain Booklet, Coughing and Deep Breathing and Surgical Site Infection Prevention

## 2013-10-12 ENCOUNTER — Encounter (HOSPITAL_COMMUNITY)
Admission: RE | Admit: 2013-10-12 | Discharge: 2013-10-12 | Disposition: A | Discharge status: Home / Self Care | Payer: 59 | Source: Ambulatory Visit | Attending: Anesthesiology | Admitting: Anesthesiology

## 2013-10-12 ENCOUNTER — Encounter (HOSPITAL_COMMUNITY)
Admission: RE | Admit: 2013-10-12 | Discharge: 2013-10-12 | Disposition: A | Discharge status: Home / Self Care | Payer: 59 | Source: Ambulatory Visit | Attending: Orthopedic Surgery | Admitting: Orthopedic Surgery

## 2013-10-12 ENCOUNTER — Encounter (HOSPITAL_COMMUNITY): Payer: Self-pay

## 2013-10-12 DIAGNOSIS — Z01818 Encounter for other preprocedural examination: Secondary | ICD-10-CM | POA: Insufficient documentation

## 2013-10-12 DIAGNOSIS — Z01812 Encounter for preprocedural laboratory examination: Secondary | ICD-10-CM | POA: Insufficient documentation

## 2013-10-12 HISTORY — DX: Personal history of other diseases of the digestive system: Z87.19

## 2013-10-12 HISTORY — DX: Atherosclerotic heart disease of native coronary artery without angina pectoris: I25.10

## 2013-10-12 HISTORY — DX: Acute myocardial infarction, unspecified: I21.9

## 2013-10-12 LAB — BASIC METABOLIC PANEL
BUN: 26 mg/dL — ABNORMAL HIGH (ref 6–23)
CO2: 27 mEq/L (ref 19–32)
Calcium: 9.5 mg/dL (ref 8.4–10.5)
Chloride: 105 mEq/L (ref 96–112)
Creatinine, Ser: 1.06 mg/dL (ref 0.50–1.35)
GFR calc Af Amer: 84 mL/min — ABNORMAL LOW (ref >90)
GFR calc non Af Amer: 72 mL/min — ABNORMAL LOW (ref >90)
Glucose, Bld: 119 mg/dL — ABNORMAL HIGH (ref 70–99)
Potassium: 4.9 mEq/L (ref 3.5–5.1)
Sodium: 141 mEq/L (ref 135–145)

## 2013-10-12 LAB — CBC
HCT: 43.1 % (ref 39.0–52.0)
Hemoglobin: 14.8 g/dL (ref 13.0–17.0)
MCH: 30.8 pg (ref 26.0–34.0)
MCHC: 34.3 g/dL (ref 30.0–36.0)
MCV: 89.8 fL (ref 78.0–100.0)
Platelets: 248 10*3/uL (ref 150–400)
RBC: 4.8 MIL/uL (ref 4.22–5.81)
RDW: 12.7 % (ref 11.5–15.5)
WBC: 9.2 10*3/uL (ref 4.0–10.5)

## 2013-10-12 NOTE — Progress Notes (Signed)
10/12/13 1021  OBSTRUCTIVE SLEEP APNEA  Have you ever been diagnosed with sleep apnea through a sleep study? No  Do you snore loudly (loud enough to be heard through closed doors)?  0  Do you often feel tired, fatigued, or sleepy during the daytime? 1  Has anyone observed you stop breathing during your sleep? 0  Do you have, or are you being treated for high blood pressure? 1  BMI more than 35 kg/m2? 0  Age over 64 years old? 1  Neck circumference greater than 40 cm/18 inches? 0  Gender: 1  Obstructive Sleep Apnea Score 4  Score 4 or greater  Results sent to PCP

## 2013-10-12 NOTE — Progress Notes (Addendum)
PCP Dr Tanya Nones Cardiologist is Dr Daleen Squibb  Cardiac clearance noted from 09-17-13 in epic Card cath noted from 05-15-10 Denies a recent CXR, echo, or stress test EKG noted from 07-22-13 in epic

## 2013-10-13 ENCOUNTER — Encounter (HOSPITAL_COMMUNITY): Payer: Self-pay

## 2013-10-13 NOTE — Progress Notes (Signed)
Anesthesia Chart Review:  Patient is a 62 year old male scheduled for left shoulder arthroscopy, rotator cuff repair on 10/20/13 by Dr. August Saucer.  History includes obesity, former smoker, HTN, HLD, CAD/NSTEMI s/p RCA stents X 2 05/2010, hiatal hernia, cholecystectomy. PCP is Dr. Tanya Nones,  Cardiologist is Dr. Daleen Squibb and patient was cleared to proceed by his partner Dr. Excell Seltzer with permission to hold Plavix 5 days preoperatively.  EKG on 07/22/13 showed NSR, non-specific ST/T wave abnormality (inferior leads).  Cardiac cath on 05/15/10 showed hypokinesis of the basla to mid inferior wall, EF 50%, RCA with mid 95% and 95% ostial PLV stenosis with a branch off the proximal PLV that appeared to be occluded.  Luminal irregularities of the left main and LCx system.  30% proximal LAD and 30-40% diffuse distal LAD stenosis.  He subsequently underwent PCI with Promus DES to tandem mid and distal RCA lesions.  Preoperative CXR and labs noted.   If no acute changes then plan to proceed.  Velna Ochs [facility] Stay [facility]/Anesthesiology Phone [phone] 10/13/2013 6: 14 PM

## 2013-10-19 MED ORDER — CEFAZOLIN SODIUM-DEXTROSE 2-3 GM-% IV SOLR
2.0000 g | INTRAVENOUS | Status: AC
  Administered 2013-10-20: 2 g via INTRAVENOUS
  Filled 2013-10-19: qty 50

## 2013-10-19 NOTE — H&P (Addendum)
Paul Rubio is an 62 y.o. male.   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 Has had a long history of left shoulder pain recently developed popping in the shoulder as well. Also developed fairly profound weakness. Denies any numbness and tingling in the hand or any neck pain. Has had an MRI scan which shows chronic retracted rotator cuff tear as well as subluxation of the biceps to joint through a partial thickness subscap tear. He has failed an injection and presents for further management.  Past Medical History  Diagnosis Date  . Hypertension     for approximately 5 years  . Hyperlipidemia     borderline; status post stress test x2, the last one 5-6 years ago and without significant abnormality per the patient  . Coronary artery disease 2011     2 stents placed  . Myocardial infarction 2011  . H/O hiatal hernia     Past Surgical History  Procedure Laterality Date  . Cholecystectomy    . Colonoscopy    . Coronary angioplasty      DES to mid and distal RCA 05/15/10    Family History  Problem Relation Age of Onset  . Heart disease Father   . Heart attack    . Sudden death Paternal Uncle     possible MI   Social History:  reports that he quit smoking about 44 years ago. His smoking use included Cigarettes. He smoked 0.00 packs per day. He does not have any smokeless tobacco history on file. He reports that he does not drink alcohol or use illicit drugs.  Allergies: No Known Allergies  No prescriptions prior to admission    No results found for this or any previous visit (from the past 48 hour(s)). No results found.  Review of Systems  Constitutional: Negative.   HENT: Negative.   Eyes: Negative.   Respiratory: Negative.   Cardiovascular: Negative.   Gastrointestinal: Negative.   Genitourinary: Negative.   Musculoskeletal: Positive for joint pain.  Skin: Negative.   Neurological: Negative.    Endo/Heme/Allergies: Negative.   Psychiatric/Behavioral: Negative.     There were no vitals taken for this visit. Physical Exam  Constitutional: He appears well-developed.  HENT:  Head: Normocephalic.  Eyes: Pupils are equal, round, and reactive to light.  Neck: Normal range of motion.  Cardiovascular: Normal rate.   Respiratory: Effort normal.  GI: Soft.  Neurological: He is alert.  Skin: Skin is warm.  Psychiatric: He has a normal mood and affect.   examination the left shoulder demonstrates weakness to extra rotation 15 abduction on the left weakness to supraspinatus testing on the left 3/5 subscap strength is intact deltoid function is intact passive range of motion is maintained on the left-hand side radial pulse intact on the left  Assessment/Plan Impression is left shoulder massive rotator cuff tear with retraction and possible irritability of the supraspinatus and infraspinatus. Patient subluxation the biceps tendon through a subscapularis tear. That is something that can be fixed. Plan is arthroscopy biceps tendon release subscap repair possible supraspinatus and infraspinatus repair possible through separate incision. Risk and benefits discussed with patient we will and to infection nerve vessel damage and the high probability that he will not receive a watertight repair. I think it best we can restore some anterior posterior stability to the shoulder and removed the subluxation biceps tendon from the joint. Patient understands risk benefits and wished to proceed all questions answered  [name] 10/19/2013, 7:20 PM

## 2013-10-20 ENCOUNTER — Encounter (HOSPITAL_COMMUNITY)
Admission: RE | Disposition: A | Discharge status: Home / Self Care | Payer: Self-pay | Source: Ambulatory Visit | Attending: Orthopedic Surgery

## 2013-10-20 ENCOUNTER — Encounter (HOSPITAL_COMMUNITY): Payer: 59 | Admitting: Vascular Surgery

## 2013-10-20 ENCOUNTER — Encounter (HOSPITAL_COMMUNITY): Payer: Self-pay | Admitting: Anesthesiology

## 2013-10-20 ENCOUNTER — Observation Stay (HOSPITAL_COMMUNITY)
Admission: RE | Admit: 2013-10-20 | Discharge: 2013-10-21 | DRG: 512 | Disposition: A | Discharge status: Home / Self Care | Payer: 59 | Source: Ambulatory Visit | Attending: Orthopedic Surgery | Admitting: Orthopedic Surgery

## 2013-10-20 ENCOUNTER — Ambulatory Visit (HOSPITAL_COMMUNITY): Payer: 59 | Admitting: Anesthesiology

## 2013-10-20 DIAGNOSIS — M75102 Unspecified rotator cuff tear or rupture of left shoulder, not specified as traumatic: Secondary | ICD-10-CM

## 2013-10-20 DIAGNOSIS — I2510 Atherosclerotic heart disease of native coronary artery without angina pectoris: Secondary | ICD-10-CM | POA: Insufficient documentation

## 2013-10-20 DIAGNOSIS — I10 Essential (primary) hypertension: Secondary | ICD-10-CM | POA: Insufficient documentation

## 2013-10-20 DIAGNOSIS — Z5333 Arthroscopic surgical procedure converted to open procedure: Secondary | ICD-10-CM | POA: Insufficient documentation

## 2013-10-20 DIAGNOSIS — E785 Hyperlipidemia, unspecified: Secondary | ICD-10-CM | POA: Insufficient documentation

## 2013-10-20 DIAGNOSIS — Z87891 Personal history of nicotine dependence: Secondary | ICD-10-CM | POA: Insufficient documentation

## 2013-10-20 DIAGNOSIS — Z23 Encounter for immunization: Secondary | ICD-10-CM | POA: Insufficient documentation

## 2013-10-20 DIAGNOSIS — M719 Bursopathy, unspecified: Secondary | ICD-10-CM | POA: Insufficient documentation

## 2013-10-20 DIAGNOSIS — M75120 Complete rotator cuff tear or rupture of unspecified shoulder, not specified as traumatic: Principal | ICD-10-CM | POA: Insufficient documentation

## 2013-10-20 DIAGNOSIS — Z9861 Coronary angioplasty status: Secondary | ICD-10-CM | POA: Insufficient documentation

## 2013-10-20 DIAGNOSIS — K449 Diaphragmatic hernia without obstruction or gangrene: Secondary | ICD-10-CM | POA: Insufficient documentation

## 2013-10-20 DIAGNOSIS — I252 Old myocardial infarction: Secondary | ICD-10-CM | POA: Insufficient documentation

## 2013-10-20 DIAGNOSIS — M67919 Unspecified disorder of synovium and tendon, unspecified shoulder: Secondary | ICD-10-CM | POA: Insufficient documentation

## 2013-10-20 HISTORY — PX: ROTATOR CUFF REPAIR: SHX139

## 2013-10-20 HISTORY — PX: SHOULDER ARTHROSCOPY WITH OPEN ROTATOR CUFF REPAIR: SHX6092

## 2013-10-20 SURGERY — ARTHROSCOPY, SHOULDER WITH REPAIR, ROTATOR CUFF, OPEN
Anesthesia: General | Site: Shoulder | Laterality: Left | Wound class: Clean

## 2013-10-20 MED ORDER — CHLORHEXIDINE GLUCONATE 4 % EX LIQD: 60.0000 mL | Freq: Once | CUTANEOUS | Status: DC

## 2013-10-20 MED ORDER — OXYCODONE HCL 5 MG PO TABS
ORAL_TABLET | ORAL | Status: AC
  Filled 2013-10-20: qty 1

## 2013-10-20 MED ORDER — SUCCINYLCHOLINE CHLORIDE 20 MG/ML IJ SOLN
INTRAMUSCULAR | Status: DC | PRN
  Administered 2013-10-20: 100 mg via INTRAVENOUS

## 2013-10-20 MED ORDER — MIDAZOLAM HCL 5 MG/ML IJ SOLN: 1.0000 mg | Freq: Once | INTRAMUSCULAR | Status: AC

## 2013-10-20 MED ORDER — MIDAZOLAM HCL 5 MG/5ML IJ SOLN
INTRAMUSCULAR | Status: DC | PRN
  Administered 2013-10-20: 1 mg via INTRAVENOUS

## 2013-10-20 MED ORDER — CEFAZOLIN SODIUM-DEXTROSE 2-3 GM-% IV SOLR
2.0000 g | Freq: Three times a day (TID) | INTRAVENOUS | Status: AC
  Administered 2013-10-20 – 2013-10-21 (×3): 2 g via INTRAVENOUS
  Filled 2013-10-20 (×3): qty 50

## 2013-10-20 MED ORDER — SODIUM CHLORIDE 0.9 % IJ SOLN
INTRAMUSCULAR | Status: DC | PRN
  Administered 2013-10-20: 50 mL

## 2013-10-20 MED ORDER — ONDANSETRON HCL 4 MG PO TABS: 4.0000 mg | ORAL_TABLET | Freq: Four times a day (QID) | ORAL | Status: DC | PRN

## 2013-10-20 MED ORDER — BUPIVACAINE-EPINEPHRINE PF 0.5-1:200000 % IJ SOLN
INTRAMUSCULAR | Status: DC | PRN
  Administered 2013-10-20: 25 mL

## 2013-10-20 MED ORDER — DEXTROSE 5 % IV SOLN
INTRAVENOUS | Status: DC | PRN
  Administered 2013-10-20: 12:00:00 via INTRAVENOUS

## 2013-10-20 MED ORDER — METHOCARBAMOL 100 MG/ML IJ SOLN
500.0000 mg | Freq: Four times a day (QID) | INTRAVENOUS | Status: DC | PRN
  Filled 2013-10-20: qty 5

## 2013-10-20 MED ORDER — SODIUM CHLORIDE 0.9 % IR SOLN
Status: DC | PRN
  Administered 2013-10-20: 3000 mL

## 2013-10-20 MED ORDER — HYDROMORPHONE HCL PF 1 MG/ML IJ SOLN
INTRAMUSCULAR | Status: AC
  Filled 2013-10-20: qty 1

## 2013-10-20 MED ORDER — MIDAZOLAM HCL 2 MG/2ML IJ SOLN
INTRAMUSCULAR | Status: AC
  Administered 2013-10-20: 1 mg
  Filled 2013-10-20: qty 2

## 2013-10-20 MED ORDER — ONDANSETRON HCL 4 MG/2ML IJ SOLN
4.0000 mg | Freq: Four times a day (QID) | INTRAMUSCULAR | Status: DC | PRN
  Administered 2013-10-21: 4 mg via INTRAVENOUS
  Filled 2013-10-20: qty 2

## 2013-10-20 MED ORDER — FENTANYL CITRATE 0.05 MG/ML IJ SOLN
50.0000 ug | Freq: Once | INTRAMUSCULAR | Status: AC
  Administered 2013-10-20: 50 ug via INTRAVENOUS

## 2013-10-20 MED ORDER — LACTATED RINGERS IV SOLN
Freq: Once | INTRAVENOUS | Status: AC
  Administered 2013-10-20: 10:00:00 via INTRAVENOUS

## 2013-10-20 MED ORDER — METHOCARBAMOL 500 MG PO TABS
500.0000 mg | ORAL_TABLET | Freq: Four times a day (QID) | ORAL | Status: DC | PRN
  Administered 2013-10-20: 500 mg via ORAL

## 2013-10-20 MED ORDER — LIDOCAINE HCL (CARDIAC) 20 MG/ML IV SOLN
INTRAVENOUS | Status: DC | PRN
  Administered 2013-10-20: 100 mg via INTRAVENOUS

## 2013-10-20 MED ORDER — IRBESARTAN 75 MG PO TABS
75.0000 mg | ORAL_TABLET | Freq: Every day | ORAL | Status: DC
  Administered 2013-10-20: 75 mg via ORAL
  Filled 2013-10-20 (×2): qty 1

## 2013-10-20 MED ORDER — POTASSIUM CHLORIDE IN NACL 20-0.9 MEQ/L-% IV SOLN
INTRAVENOUS | Status: AC
  Administered 2013-10-20: 18:00:00 via INTRAVENOUS
  Filled 2013-10-20: qty 1000

## 2013-10-20 MED ORDER — PHENYLEPHRINE HCL 10 MG/ML IJ SOLN
10.0000 mg | INTRAVENOUS | Status: DC | PRN
  Administered 2013-10-20: 20 ug/min via INTRAVENOUS

## 2013-10-20 MED ORDER — PROMETHAZINE HCL 25 MG/ML IJ SOLN: 6.2500 mg | INTRAMUSCULAR | Status: DC | PRN

## 2013-10-20 MED ORDER — METOPROLOL TARTRATE 12.5 MG HALF TABLET
12.5000 mg | ORAL_TABLET | Freq: Two times a day (BID) | ORAL | Status: DC
  Administered 2013-10-20 – 2013-10-21 (×2): 12.5 mg via ORAL
  Filled 2013-10-20 (×3): qty 1

## 2013-10-20 MED ORDER — FENTANYL CITRATE 0.05 MG/ML IJ SOLN
INTRAMUSCULAR | Status: DC | PRN
  Administered 2013-10-20: 50 ug via INTRAVENOUS
  Administered 2013-10-20: 100 ug via INTRAVENOUS

## 2013-10-20 MED ORDER — ASPIRIN EC 81 MG PO TBEC
81.0000 mg | DELAYED_RELEASE_TABLET | Freq: Every day | ORAL | Status: DC
  Administered 2013-10-20 – 2013-10-21 (×2): 81 mg via ORAL
  Filled 2013-10-20 (×2): qty 1

## 2013-10-20 MED ORDER — OXYCODONE-ACETAMINOPHEN 5-325 MG PO TABS
1.0000 | ORAL_TABLET | ORAL | Status: DC | PRN
Start: 2013-10-20 — End: 2013-10-21
  Administered 2013-10-20 – 2013-10-21 (×3): 2 via ORAL
  Filled 2013-10-20 (×3): qty 2

## 2013-10-20 MED ORDER — GLYCOPYRROLATE 0.2 MG/ML IJ SOLN
INTRAMUSCULAR | Status: DC | PRN
  Administered 2013-10-20: .8 mg via INTRAVENOUS

## 2013-10-20 MED ORDER — ACETAMINOPHEN 650 MG RE SUPP: 650.0000 mg | Freq: Four times a day (QID) | RECTAL | Status: DC | PRN

## 2013-10-20 MED ORDER — ACETAMINOPHEN 325 MG PO TABS: 650.0000 mg | ORAL_TABLET | Freq: Four times a day (QID) | ORAL | Status: DC | PRN

## 2013-10-20 MED ORDER — LACTATED RINGERS IV SOLN
INTRAVENOUS | Status: DC | PRN
  Administered 2013-10-20 (×2): via INTRAVENOUS

## 2013-10-20 MED ORDER — FENTANYL CITRATE 0.05 MG/ML IJ SOLN
INTRAMUSCULAR | Status: AC
  Administered 2013-10-20: 50 ug via INTRAVENOUS
  Filled 2013-10-20: qty 2

## 2013-10-20 MED ORDER — METOCLOPRAMIDE HCL 10 MG PO TABS: 5.0000 mg | ORAL_TABLET | Freq: Three times a day (TID) | ORAL | Status: DC | PRN

## 2013-10-20 MED ORDER — ROCURONIUM BROMIDE 100 MG/10ML IV SOLN
INTRAVENOUS | Status: DC | PRN
  Administered 2013-10-20: 30 mg via INTRAVENOUS
  Administered 2013-10-20: 20 mg via INTRAVENOUS

## 2013-10-20 MED ORDER — CLOPIDOGREL BISULFATE 75 MG PO TABS
75.0000 mg | ORAL_TABLET | Freq: Every day | ORAL | Status: DC
  Administered 2013-10-20 – 2013-10-21 (×2): 75 mg via ORAL
  Filled 2013-10-20 (×2): qty 1

## 2013-10-20 MED ORDER — ATORVASTATIN CALCIUM 10 MG PO TABS
10.0000 mg | ORAL_TABLET | Freq: Every day | ORAL | Status: DC
  Administered 2013-10-20: 10 mg via ORAL
  Filled 2013-10-20 (×2): qty 1

## 2013-10-20 MED ORDER — METOCLOPRAMIDE HCL 5 MG/ML IJ SOLN: 5.0000 mg | Freq: Three times a day (TID) | INTRAMUSCULAR | Status: DC | PRN

## 2013-10-20 MED ORDER — HYDROMORPHONE HCL PF 1 MG/ML IJ SOLN
0.2500 mg | INTRAMUSCULAR | Status: DC | PRN
  Administered 2013-10-20 (×2): 0.5 mg via INTRAVENOUS

## 2013-10-20 MED ORDER — PHENOL 1.4 % MT LIQD: 1.0000 | OROMUCOSAL | Status: DC | PRN

## 2013-10-20 MED ORDER — HYDROMORPHONE HCL PF 1 MG/ML IJ SOLN
0.5000 mg | INTRAMUSCULAR | Status: DC | PRN
  Administered 2013-10-20 – 2013-10-21 (×2): 1 mg via INTRAVENOUS
  Filled 2013-10-20: qty 1

## 2013-10-20 MED ORDER — PROPOFOL 10 MG/ML IV BOLUS
INTRAVENOUS | Status: DC | PRN
  Administered 2013-10-20: 160 mg via INTRAVENOUS

## 2013-10-20 MED ORDER — METHOCARBAMOL 500 MG PO TABS
ORAL_TABLET | ORAL | Status: AC
  Filled 2013-10-20: qty 1

## 2013-10-20 MED ORDER — ONDANSETRON HCL 4 MG/2ML IJ SOLN
INTRAMUSCULAR | Status: DC | PRN
  Administered 2013-10-20: 4 mg via INTRAVENOUS

## 2013-10-20 MED ORDER — EPINEPHRINE HCL 1 MG/ML IJ SOLN
INTRAMUSCULAR | Status: DC | PRN
  Administered 2013-10-20: 1 mg

## 2013-10-20 MED ORDER — BUPIVACAINE HCL (PF) 0.25 % IJ SOLN
INTRAMUSCULAR | Status: AC
  Filled 2013-10-20: qty 30

## 2013-10-20 MED ORDER — NITROGLYCERIN 0.4 MG SL SUBL: 0.4000 mg | SUBLINGUAL_TABLET | SUBLINGUAL | Status: DC | PRN

## 2013-10-20 MED ORDER — OXYCODONE HCL 5 MG PO TABS
5.0000 mg | ORAL_TABLET | Freq: Once | ORAL | Status: AC | PRN
  Administered 2013-10-20: 5 mg via ORAL

## 2013-10-20 MED ORDER — OXYCODONE HCL 5 MG/5ML PO SOLN: 5.0000 mg | Freq: Once | ORAL | Status: AC | PRN

## 2013-10-20 MED ORDER — EPINEPHRINE HCL 1 MG/ML IJ SOLN
INTRAMUSCULAR | Status: AC
  Filled 2013-10-20: qty 1

## 2013-10-20 MED ORDER — NEOSTIGMINE METHYLSULFATE 1 MG/ML IJ SOLN
INTRAMUSCULAR | Status: DC | PRN
  Administered 2013-10-20: 5 mg via INTRAVENOUS

## 2013-10-20 MED ORDER — MENTHOL 3 MG MT LOZG: 1.0000 | LOZENGE | OROMUCOSAL | Status: DC | PRN

## 2013-10-20 SURGICAL SUPPLY — 74 items
ANCH SUT 2 CRKSRW FT 14X4.5 (Anchor) ×1 IMPLANT
ANCHOR CORKSCREW BIO 5.5 FT (Anchor) ×4 IMPLANT
ANCHOR PEEK CORKSCREW 4.5 (Anchor) ×2 IMPLANT
APL SKNCLS STERI-STRIP NONHPOA (GAUZE/BANDAGES/DRESSINGS) ×1
BENZOIN TINCTURE PRP APPL 2/3 (GAUZE/BANDAGES/DRESSINGS) ×2 IMPLANT
BIT DRILL TAK (DRILL) IMPLANT
BLADE CUDA 5.5 (BLADE) IMPLANT
BLADE CUTTER GATOR 3.5 (BLADE) ×2 IMPLANT
BLADE GREAT WHITE 4.2 (BLADE) ×2 IMPLANT
BLADE SURG 11 STRL SS (BLADE) ×2 IMPLANT
BUR GATOR 2.9 (BURR) IMPLANT
BUR OVAL 6.0 (BURR) ×2 IMPLANT
CANNULA SHOULDER 7CM (CANNULA) IMPLANT
CARTRIDGE CURVETEK MED (MISCELLANEOUS) IMPLANT
CARTRIDGE CURVETEK XLRG (MISCELLANEOUS) IMPLANT
CLOTH BEACON ORANGE TIMEOUT ST (SAFETY) ×2 IMPLANT
COVER SURGICAL LIGHT HANDLE (MISCELLANEOUS) ×2 IMPLANT
DRAPE INCISE IOBAN 66X45 STRL (DRAPES) ×6 IMPLANT
DRAPE STERI 35X30 U-POUCH (DRAPES) ×2 IMPLANT
DRAPE U-SHAPE 47X51 STRL (DRAPES) ×4 IMPLANT
DRILL TAK (DRILL)
DRSG PAD ABDOMINAL 8X10 ST (GAUZE/BANDAGES/DRESSINGS) ×2 IMPLANT
DURAPREP 26ML APPLICATOR (WOUND CARE) ×2 IMPLANT
ELECT MENISCUS 165MM 90D (ELECTRODE) IMPLANT
ELECT REM PT RETURN 9FT ADLT (ELECTROSURGICAL) ×2
ELECTRODE REM PT RTRN 9FT ADLT (ELECTROSURGICAL) ×1 IMPLANT
FILTER STRAW FLUID ASPIR (MISCELLANEOUS) ×2 IMPLANT
GAUZE XEROFORM 1X8 LF (GAUZE/BANDAGES/DRESSINGS) ×2 IMPLANT
GLOVE BIOGEL PI IND STRL 8 (GLOVE) ×1 IMPLANT
GLOVE BIOGEL PI INDICATOR 8 (GLOVE) ×1
GLOVE SURG ORTHO 8.0 STRL STRW (GLOVE) ×2 IMPLANT
GOWN PREVENTION PLUS LG XLONG (DISPOSABLE) IMPLANT
GOWN STRL NON-REIN LRG LVL3 (GOWN DISPOSABLE) ×6 IMPLANT
KIT BASIN OR (CUSTOM PROCEDURE TRAY) ×2 IMPLANT
KIT BIO-TENODESIS 3X8 DISP (MISCELLANEOUS) ×2
KIT INSRT BABSR STRL DISP BTN (MISCELLANEOUS) ×1 IMPLANT
KIT ROOM TURNOVER OR (KITS) ×2 IMPLANT
MANIFOLD NEPTUNE II (INSTRUMENTS) ×2 IMPLANT
NDL SUT 6 .5 CRC .975X.05 MAYO (NEEDLE) ×1 IMPLANT
NEEDLE HYPO 25X1 1.5 SAFETY (NEEDLE) ×2 IMPLANT
NEEDLE MAYO TAPER (NEEDLE) ×2
NEEDLE SPNL 18GX3.5 QUINCKE PK (NEEDLE) ×2 IMPLANT
NS IRRIG 1000ML POUR BTL (IV SOLUTION) ×2 IMPLANT
PACK SHOULDER (CUSTOM PROCEDURE TRAY) ×2 IMPLANT
PAD ARMBOARD 7.5X6 YLW CONV (MISCELLANEOUS) ×4 IMPLANT
SCREW BIO TENODEIS 7MM (Screw) ×2 IMPLANT
SET ARTHROSCOPY TUBING (MISCELLANEOUS) ×2
SET ARTHROSCOPY TUBING LN (MISCELLANEOUS) ×1 IMPLANT
SLING ARM IMMOBILIZER MED (SOFTGOODS) IMPLANT
SPEAR FASTAKII (SLEEVE) IMPLANT
SPONGE GAUZE 4X4 12PLY (GAUZE/BANDAGES/DRESSINGS) ×2 IMPLANT
SPONGE LAP 4X18 X RAY DECT (DISPOSABLE) ×4 IMPLANT
STRIP CLOSURE SKIN 1/2X4 (GAUZE/BANDAGES/DRESSINGS) ×2 IMPLANT
SUCTION FRAZIER TIP 10 FR DISP (SUCTIONS) ×2 IMPLANT
SUT ETHILON 3 0 PS 1 (SUTURE) ×2 IMPLANT
SUT FIBERWIRE 2-0 18 17.9 3/8 (SUTURE)
SUT PROLENE 3 0 PS 2 (SUTURE) ×2 IMPLANT
SUT VIC AB 0 CT1 27 (SUTURE) ×4
SUT VIC AB 0 CT1 27XBRD ANBCTR (SUTURE) ×2 IMPLANT
SUT VIC AB 1 CT1 27 (SUTURE) ×2
SUT VIC AB 1 CT1 27XBRD ANBCTR (SUTURE) ×1 IMPLANT
SUT VIC AB 2-0 CT1 27 (SUTURE) ×2
SUT VIC AB 2-0 CT1 TAPERPNT 27 (SUTURE) ×1 IMPLANT
SUT VICRYL 0 UR6 27IN ABS (SUTURE) IMPLANT
SUTURE FIBERWR 2-0 18 17.9 3/8 (SUTURE) IMPLANT
SYR 20CC LL (SYRINGE) ×4 IMPLANT
SYR 3ML LL SCALE MARK (SYRINGE) ×2 IMPLANT
SYR TB 1ML LUER SLIP (SYRINGE) ×2 IMPLANT
TAPE CLOTH SURG 6X10 WHT LF (GAUZE/BANDAGES/DRESSINGS) ×2 IMPLANT
TOWEL OR 17X24 6PK STRL BLUE (TOWEL DISPOSABLE) ×2 IMPLANT
TOWEL OR 17X26 10 PK STRL BLUE (TOWEL DISPOSABLE) ×2 IMPLANT
TRAY FOLEY CATH 16FR SILVER (SET/KITS/TRAYS/PACK) ×2 IMPLANT
WAND 90 DEG TURBOVAC W/CORD (SURGICAL WAND) ×2 IMPLANT
WATER STERILE IRR 1000ML POUR (IV SOLUTION) ×2 IMPLANT

## 2013-10-20 NOTE — Anesthesia Preprocedure Evaluation (Addendum)
Anesthesia Evaluation  Patient identified by MRN, date of birth, ID band Patient awake    Reviewed: Allergy & Precautions, H&P , NPO status , Patient's Chart, lab work & pertinent test results, reviewed documented beta blocker date and time   Airway Mallampati: II TM Distance: >3 FB Neck ROM: Full    Dental  (+) Teeth Intact, Caps and Dental Advisory Given   Pulmonary former smoker,  breath sounds clear to auscultation        Cardiovascular hypertension, Pt. on home beta blockers + CAD, + Past MI and + Cardiac Stents Rhythm:Regular Rate:Normal     Neuro/Psych    GI/Hepatic hiatal hernia,   Endo/Other  Morbid obesity  Renal/GU      Musculoskeletal   Abdominal (+) + obese,   Peds  Hematology   Anesthesia Other Findings   Reproductive/Obstetrics                         Anesthesia Physical Anesthesia Plan  ASA: III  Anesthesia Plan: General   Post-op Pain Management:    Induction: Intravenous  Airway Management Planned: Oral ETT  Additional Equipment:   Intra-op Plan:   Post-operative Plan: Extubation in OR  Informed Consent: I have reviewed the patients History and Physical, chart, labs and discussed the procedure including the risks, benefits and alternatives for the proposed anesthesia with the patient or authorized representative who has indicated his/her understanding and acceptance.     Plan Discussed with:   Anesthesia Plan Comments:         Anesthesia Quick Evaluation

## 2013-10-20 NOTE — Preoperative (Signed)
Beta Blockers   Reason not to administer Beta Blockers:Metoprolol this morning 

## 2013-10-20 NOTE — Interval H&P Note (Signed)
History and Physical Interval Note:  10/20/2013 10:49 AM  Paul Rubio  has presented today for surgery, with the diagnosis of LT. SHOULDER ROTATOR CUFF TEAR/MEDIAL BICEPS SUBLUXATION.  The various methods of treatment have been discussed with the patient and family. After consideration of risks, benefits and other options for treatment, the patient has consented to  Procedure(s) with comments: SHOULDER ARTHROSCOPY WITH OPEN ROTATOR CUFF REPAIR (Left) - Left shoulder arthroscopy, debridement, open biceps tenodesis and rotator cuff repair. as a surgical intervention .  The patient's history has been reviewed, patient examined, no change in status, stable for surgery.  I have reviewed the patient's chart and labs.  Questions were answered to the patient's satisfaction.     DEAN,GREGORY SCOTT

## 2013-10-20 NOTE — Anesthesia Procedure Notes (Addendum)
Anesthesia Regional Block:  Interscalene brachial plexus block  Pre-Anesthetic Checklist: ,, timeout performed, Correct Patient, Correct Site, Correct Laterality, Correct Procedure, Correct Position, site marked, Risks and benefits discussed, pre-op evaluation,  At surgeon's request and post-op pain management  Laterality: Upper and Left  Prep: chloraprep       Needles:  Injection technique: Single-shot  Needle Type: Echogenic Needle      Needle Gauge: 22 and 22 G  Needle insertion depth: 4 cm   Additional Needles:  Procedures: ultrasound guided (picture in chart) and nerve stimulator Interscalene brachial plexus block  Nerve Stimulator or Paresthesia:  Response: Twitch elicited, 0.5 mA, 0.3 ms,   Additional Responses:   Narrative:  Start time: 10/20/2013 10:30 AM End time: 10/20/2013 10:45 AM Injection made incrementally with aspirations every 5 mL.  Performed by: Personally  Anesthesiologist: Alma Friendly, MD  Additional Notes: Block assessed prior to start of surgery  Interscalene brachial plexus block Procedure Name: Intubation Date/Time: 10/20/2013 11:29 AM Performed by: Darcey Nora B Pre-anesthesia Checklist: Patient identified, Emergency Drugs available, Suction available and Patient being monitored Patient Re-evaluated:Patient Re-evaluated prior to inductionOxygen Delivery Method: Circle system utilized Preoxygenation: Pre-oxygenation with 100% oxygen Intubation Type: IV induction Ventilation: Mask ventilation without difficulty Laryngoscope Size: Mac and 4 Grade View: Grade II Tube type: Oral Tube size: 7.5 mm Number of attempts: 1 Airway Equipment and Method: Stylet Placement Confirmation: ETT inserted through vocal cords under direct vision,  breath sounds checked- equal and bilateral and positive ETCO2 Secured at: 22 (cm at teeth) cm Dental Injury: Teeth and Oropharynx as per pre-operative assessment

## 2013-10-20 NOTE — Anesthesia Postprocedure Evaluation (Signed)
Anesthesia Post Note  Patient: Paul Rubio  Procedure(s) Performed: Procedure(s) (LRB): SHOULDER ARTHROSCOPY WITH OPEN ROTATOR CUFF REPAIR (Left)  Anesthesia type: General  Patient location: PACU  Post pain: Pain level controlled and Adequate analgesia  Post assessment: Post-op Vital signs reviewed, Patient's Cardiovascular Status Stable, Respiratory Function Stable, Patent Airway and Pain level controlled  Last Vitals:  Filed Vitals:   10/20/13 1600  BP:   Pulse: 80  Temp:   Resp: 12    Post vital signs: Reviewed and stable  Level of consciousness: awake, alert  and oriented  Complications: No apparent anesthesia complications

## 2013-10-20 NOTE — Brief Op Note (Signed)
10/20/2013  2:33 PM  PATIENT:  Paul Rubio  62 y.o. male  PRE-OPERATIVE DIAGNOSIS:  LT. SHOULDER ROTATOR CUFF TEAR/MEDIAL BICEPS SUBLUXATION.  POST-OPERATIVE DIAGNOSIS:  LT. SHOULDER ROTATOR CUFF TEAR/MEDIAL BICEPS   PROCEDURE:  Procedure(s): SHOULDER ARTHROSCOPY WITH OPEN ROTATOR CUFF REPAIR  SURGEON:  Surgeon(s): Cammy Copa, MD  ASSISTANT: none  ANESTHESIA:   general  EBL: 25 ml    Total I/O In: 1050 [I.V.:1050] Out: 300 [Urine:200; Blood:100]  BLOOD ADMINISTERED: none  DRAINS: none   LOCAL MEDICATIONS USED:  none  SPECIMEN:  No Specimen  COUNTS:  YES  TOURNIQUET:  * No tourniquets in log *  DICTATION: .Other Dictation: Dictation Number [id number]  PLAN OF CARE: Admit for overnight observation  PATIENT DISPOSITION:  PACU - hemodynamically stable

## 2013-10-20 NOTE — Transfer of Care (Signed)
Immediate Anesthesia Transfer of Care Note  Patient: Paul Rubio  Procedure(s) Performed: Procedure(s) with comments: SHOULDER ARTHROSCOPY WITH OPEN ROTATOR CUFF REPAIR (Left) - Left shoulder arthroscopy, debridement, open biceps tenodesis and rotator cuff repair.  Patient Location: PACU  Anesthesia Type:General  Level of Consciousness: awake, alert , oriented and patient cooperative  Airway & Oxygen Therapy: Patient Spontanous Breathing and Patient connected to face mask oxygen  Post-op Assessment: Report given to PACU RN, Post -op Vital signs reviewed and stable and Patient moving all extremities  Post vital signs: Reviewed and stable  Complications: No apparent anesthesia complications

## 2013-10-20 NOTE — Progress Notes (Signed)
Care of pt assumed by MA Zyrell Carmean RN 

## 2013-10-21 ENCOUNTER — Encounter (HOSPITAL_COMMUNITY): Payer: Self-pay | Admitting: General Practice

## 2013-10-21 MED ORDER — INFLUENZA VAC SPLIT QUAD 0.5 ML IM SUSP
0.5000 mL | INTRAMUSCULAR | Status: AC
  Administered 2013-10-21: 0.5 mL via INTRAMUSCULAR
  Filled 2013-10-21: qty 0.5

## 2013-10-21 MED ORDER — INFLUENZA VAC SPLIT QUAD 0.5 ML IM SUSP: 0.5000 mL | INTRAMUSCULAR | Status: DC

## 2013-10-21 MED ORDER — OXYCODONE-ACETAMINOPHEN 5-325 MG PO TABS: 1.0000 | ORAL_TABLET | ORAL | Status: DC | PRN

## 2013-10-21 MED ORDER — METHOCARBAMOL 500 MG PO TABS: 500.0000 mg | ORAL_TABLET | Freq: Three times a day (TID) | ORAL | Status: DC | PRN

## 2013-10-21 NOTE — Evaluation (Signed)
Occupational Therapy Evaluation Patient Details Name: Paul Rubio MRN: [id number] DOB: 16-Sep-1951 Today's Date: 10/21/2013 Time: 4098-1191 OT Time Calculation (min): 61 min  OT Assessment / Plan / Recommendation History of present illness s/p L RCR and biceps tendon release   Clinical Impression   Pt educated in sling use, hemi techniques for ADL, pendulums and AROM of elbow to hand.  Wife present for session.  Pt moving well with close supervision as this was his first time OOB since surgery.    OT Assessment  Progress rehab of shoulder as ordered by MD at follow-up appointment    Follow Up Recommendations  No OT follow up    Barriers to Discharge      Equipment Recommendations  None recommended by OT    Recommendations for Other Services    Frequency       Precautions / Restrictions Precautions Precautions: Shoulder Type of Shoulder Precautions: pendulums and PROM as tolerated, sling use, ADL Shoulder Interventions: Shoulder sling/immobilizer Precaution Booklet Issued: Yes (comment)   Pertinent Vitals/Pain No c/o pain, nerve block, premedicated, ice, BP 113/70 in supine, 109/77 in sitting    ADL  Eating/Feeding: Set up Where Assessed - Eating/Feeding: Chair Grooming: Wash/dry hands;Supervision/safety Where Assessed - Grooming: Unsupported standing Upper Body Bathing: Minimal assistance Where Assessed - Upper Body Bathing: Unsupported standing Lower Body Bathing: Supervision/safety Where Assessed - Lower Body Bathing: Unsupported standing Upper Body Dressing: Minimal assistance Where Assessed - Upper Body Dressing: Unsupported sitting Lower Body Dressing: Supervision/safety Where Assessed - Lower Body Dressing: Unsupported sitting;Supported sit to stand Toilet Transfer: Min Pension scheme manager Method: Sit to Barista: Comfort height toilet Toileting - Clothing Manipulation and Hygiene: Supervision/safety Where Assessed - International aid/development worker and Hygiene: Sit to stand from 3-in-1 or toilet Transfers/Ambulation Related to ADLs: min guard as this was his first time up, no device ADL Comments: Instructed in hemi techniques for ADL, sling donning and doffing, positioning in bed and in chair.    OT Diagnosis:    OT Problem List:   OT Treatment Interventions:     OT Goals(Current goals can be found in the care plan section) Acute Rehab OT Goals Patient Stated Goal: return to work  Visit Information  Last OT Received On: 10/21/13 History of Present Illness: s/p L RCR and biceps tendon release       Prior Functioning     Home Living Family/patient expects to be discharged to:: Private residence Living Arrangements: Spouse/significant other Available Help at Discharge: Family;Available 24 hours/day Prior Function Level of Independence: Independent Comments: dairy farmer Communication Communication: No difficulties Dominant Hand: Right         Vision/Perception Vision - History Patient Visual Report: No change from baseline   Cognition  Cognition Arousal/Alertness: Awake/alert Behavior During Therapy: WFL for tasks assessed/performed Overall Cognitive Status: Within Functional Limits for tasks assessed    Extremity/Trunk Assessment Upper Extremity Assessment Upper Extremity Assessment: LUE deficits/detail LUE Deficits / Details: s/p RCR, full AROM elbow to hand LUE Sensation:  (remains numb s/p nerve block) LUE Coordination: decreased gross motor Lower Extremity Assessment Lower Extremity Assessment: Overall WFL for tasks assessed Cervical / Trunk Assessment Cervical / Trunk Assessment: Normal     Mobility Bed Mobility Bed Mobility: Supine to Sit;Sitting - Scoot to Edge of Bed Supine to Sit: 6: Modified independent (Device/Increase time);With rails;HOB elevated Sitting - Scoot to Edge of Bed: 6: Modified independent (Device/Increase time) Transfers Transfers: Sit to Stand;Stand to  Sit Sit to Stand:  4: Min guard;With upper extremity assist;From bed;From toilet Stand to Sit: 4: Min guard;With upper extremity assist;To chair/3-in-1     Exercise     Balance     End of Session OT - End of Session Activity Tolerance: Patient tolerated treatment well Patient left: in chair;with call bell/phone within reach;with family/visitor present Nurse Communication:  (pt urinated, BP)  GO     Evern Bio 10/21/2013, 11:53 AM [phone]

## 2013-10-21 NOTE — Op Note (Signed)
NAMEJASDEEP, [name] NO.:  [id number]  MEDICAL RECORD NO.:  [id number]  LOCATION:  [id number]                        FACILITY:  [facility]  PHYSICIAN:  Burnard Bunting, M.D.    DATE OF BIRTH:  Jul 14, 1951  DATE OF PROCEDURE: DATE OF DISCHARGE:                              OPERATIVE REPORT   PREOPERATIVE DIAGNOSES:  Left shoulder rotator cuff tear, biceps subluxation.  POSTOPERATIVE DIAGNOSES:  Left shoulder rotator cuff tear, biceps subluxation.  PROCEDURE:  Left shoulder diagnostic arthroscopy with debridement of labrum, biceps tendon release, debridement of intra-articular synovitis in the rotator interval, open rotator cuff repair of the subscapularis and infraspinatus.  SURGEON:  Burnard Bunting, M.D.  ASSISTANT:  Baldo [employer] Wilson.  INDICATIONS:  Paul Rubio is a patient with left shoulder pain.  He works as a Engineer, maintenance.  He had shoulder pain for a long time. Presents now for operative management after feelings of subluxation and mechanical symptoms of popping and weakness in the left shoulder.  MRI scan shows what is likely an irreparable rotator cuff tear, the infraspinatus subluxation of biceps tendon through a subscapularis tear as well.  PROCEDURE IN DETAIL:  The patient brought to the operating room, where general endotracheal anesthesia was induced.  Preoperative antibiotics were administered.  Time-out was called.  The patient was placed in the beach-chair position with the head in neutral position.  Left arm, shoulder, and hand were prescrubbed with alcohol, Betadine, which was allowed to air dry, prepped with DuraPrep solution, draped in sterile manner.  Paul Rubio was used to cover the operative field including the axilla.  Time-out was called.  The posterior portal was created.  A 2 cm medial and inferior to the posterolateral margin of the acromion, diagnostic arthroscopy was performed.  Anterior portal created under direct visualization.  The  patient did have subluxation of the biceps tendon.  The biceps tendon was released.  The superior labrum was debrided.  Synovitis in the rotator interval also debrided using ArthroCare wand.  Lateral portal was created, and mobilization of the cuff was attempted.  Supraspinatus would not mobilize to anywhere near its attachment.  Infraspinatus mobilized only partially posteriorly. Instruments were removed.  Incision was made over the midportion of the deltoid, skin and subcutaneous tissue sharply divided.  Deltoid split measured distance 4 cm from the acromion.  Soft drain retractor placed. One suture anchor placed within the posterior aspect of the supraspinatus anchored to prevent further detachment and retraction.  At this time, the incision was irrigated and closed using interrupted inverted 0 Vicryl suture, 2-0 Vicryl suture, and 3-0 Prolene.  The attention was then directed to the anterior aspect of the shoulder. Anterior incision was made extending from the anterior portal distally. Skin and subcutaneous tissues were sharply divided.  Deltopectoral groove was identified.  Cephalic vein was mobilized laterally.  Kolbel retractor was placed.  Bicipital groove was entered.  Biceps tendon was then tenodesed with a 7 x 23 mm Arthrex interference screw.  The subscapularis tendon was repaired using 155 corkscrew anchor, 145 corkscrew anchor.  This was essentially the upper half of the subscapularis tendon.  Thorough irrigation performed.  Self-retaining retractors were removed.  Deltopectoral closed using 0 Vicryl suture followed by interrupted inverted 2-0 Vicryl suture and running 3-0 pull- out Prolene.  Posterior portal closed with 3-0 nylon.  Bulky dressing, Steri-Strips, shoulder immobilizer applied.  The patient tolerated the procedure well.  No immediate complications.  Transferred to recovery room in stable condition.     Burnard Bunting, M.D.     GSD/MEDQ  D:  10/20/2013   T:  10/21/2013  Job:  [id number]

## 2013-10-21 NOTE — Progress Notes (Signed)
Pt stable Pain controlled Plan dc today cpm at home Dressing change today

## 2013-10-23 ENCOUNTER — Encounter (HOSPITAL_COMMUNITY): Payer: Self-pay | Admitting: Orthopedic Surgery

## 2013-11-04 NOTE — Discharge Summary (Signed)
Physician Discharge Summary  Patient ID: Paul Rubio MRN: [id number] DOB/AGE: 1951/10/23 62 y.o.  Admit date: 10/20/2013 Discharge date: 10/21/2013 Admission Diagnoses:  Rotator cuff tear Discharge Diagnoses:  Same  Surgeries: Procedure(s): SHOULDER ARTHROSCOPY WITH OPEN ROTATOR CUFF REPAIR on 10/20/2013   Consultants:    Discharged Condition: Stable  Hospital Course: Paul Rubio a patient with left shoulder pain admitted 10/20/2013, and found to have a diagnosis of rotator cuf tear and biceps subluxation.  They were brought to the operating room on 10/20/2013 and underwent the above named procedures. Tolerated well and was dced POD 1.   Antibiotics given:  Anti-infectives   Start     Dose/Rate Route Frequency Ordered Stop   10/20/13 1700  ceFAZolin (ANCEF) IVPB 2 g/50 mL premix     2 g 100 mL/hr over 30 Minutes Intravenous 3 times per day 10/20/13 1656 10/21/13 0533   10/20/13 0600  ceFAZolin (ANCEF) IVPB 2 g/50 mL premix     2 g 100 mL/hr over 30 Minutes Intravenous On call to O.R. 10/19/13 1425 10/20/13 1135    .  Recent vital signs:  Filed Vitals:   10/21/13 1020  BP: 108/67  Pulse: 73  Temp:   Resp:     Recent laboratory studies:  Results for orders placed during the hospital encounter of 10/12/13  CBC      Result Value Range   WBC 9.2  4.0 - 10.5 K/uL   RBC 4.80  4.22 - 5.81 MIL/uL   Hemoglobin 14.8  13.0 - 17.0 g/dL   HCT 40.9  81.1 - 91.4 %   MCV 89.8  78.0 - 100.0 fL   MCH 30.8  26.0 - 34.0 pg   MCHC 34.3  30.0 - 36.0 g/dL   RDW 78.2  95.6 - 21.3 %   Platelets 248  150 - 400 K/uL  BASIC METABOLIC PANEL      Result Value Range   Sodium 141  135 - 145 mEq/L   Potassium 4.9  3.5 - 5.1 mEq/L   Chloride 105  96 - 112 mEq/L   CO2 27  19 - 32 mEq/L   Glucose, Bld 119 (*) 70 - 99 mg/dL   BUN 26 (*) 6 - 23 mg/dL   Creatinine, Ser 0.86  0.50 - 1.35 mg/dL   Calcium 9.5  8.4 - 57.8 mg/dL   GFR calc non Af Amer 72 (*) >90 mL/min   GFR calc Af  Amer 84 (*) >90 mL/min    Discharge Medications:     Medication List         aspirin EC 81 MG tablet  Take 81 mg by mouth daily.     clopidogrel 75 MG tablet  Commonly known as:  PLAVIX  Take 1 tablet (75 mg total) by mouth daily.     FISH OIL TRIPLE STRENGTH 1400 MG Caps  Take 1,400 mg by mouth daily.     methocarbamol 500 MG tablet  Commonly known as:  ROBAXIN  Take 1 tablet (500 mg total) by mouth every 8 (eight) hours as needed for muscle spasms.     metoprolol tartrate 25 MG tablet  Commonly known as:  LOPRESSOR  Take 0.5 tablets (12.5 mg total) by mouth 2 (two) times daily.     multivitamin with minerals Tabs tablet  Take 1 tablet by mouth daily.     nitroGLYCERIN 0.4 MG SL tablet  Commonly known as:  NITROSTAT  Place 1 tablet (0.4 mg total) under  the tongue every 5 (five) minutes as needed.     olmesartan 20 MG tablet  Commonly known as:  BENICAR  Take 20 mg by mouth daily.     oxyCODONE-acetaminophen 5-325 MG per tablet  Commonly known as:  PERCOCET/ROXICET  Take 1-2 tablets by mouth every 4 (four) hours as needed for moderate pain.     rosuvastatin 10 MG tablet  Commonly known as:  CRESTOR  Take 1 tablet (10 mg total) by mouth daily.        Diagnostic Studies: Dg Chest 2 View  10/12/2013   CLINICAL DATA:  62-year- male preoperative study for shoulder surgery. Hypertension, hiatal hernia. Initial encounter.  EXAM: CHEST  2 VIEW  COMPARISON:  05/12/2010 and earlier.  FINDINGS: Stable lung volumes. Normal cardiac size and mediastinal contours. Visualized tracheal air column is within normal limits. No pneumothorax, pulmonary edema, pleural effusion or confluent pulmonary opacity. No acute osseous abnormality identified. Stable right upper quadrant surgical clips.  IMPRESSION: No acute cardiopulmonary abnormality.   Electronically Signed   By: Augusto Gamble M.D.   On: 10/12/2013 12:11    Disposition: 01-Home or Self Care      Discharge Orders   Future  Appointments Provider Department Dept Phone   12/10/2013 11:15 AM Donita Brooks, MD [facility] [phone]   Future Orders Complete By Expires   Call MD / Call 911  As directed    Comments:     If you experience chest pain or shortness of breath, CALL 911 and be transported to the hospital emergency room.  If you develope a fever above 101 F, pus (white drainage) or increased drainage or redness at the wound, or calf pain, call your surgeon's office.   Constipation Prevention  As directed    Comments:     Drink plenty of fluids.  Prune juice may be helpful.  You may use a stool softener, such as Colace (over the counter) 100 mg twice a day.  Use MiraLax (over the counter) for constipation as needed.   Diet - low sodium heart healthy  As directed    Discharge instructions  As directed    Comments:     CPM 1 hour 3 times a day Keep arm in sling otherwise Keep incision dry   Increase activity slowly as tolerated  As directed          Signed: DEAN,GREGORY SCOTT 11/04/2013, 8:16 AM

## 2013-11-28 ENCOUNTER — Other Ambulatory Visit: Payer: Self-pay | Admitting: Family Medicine

## 2013-12-04 ENCOUNTER — Other Ambulatory Visit: Payer: 59

## 2013-12-04 DIAGNOSIS — E785 Hyperlipidemia, unspecified: Secondary | ICD-10-CM

## 2013-12-04 DIAGNOSIS — I10 Essential (primary) hypertension: Secondary | ICD-10-CM

## 2013-12-04 DIAGNOSIS — Z0000 Encounter for general adult medical examination without abnormal findings: Secondary | ICD-10-CM

## 2013-12-04 DIAGNOSIS — I2510 Atherosclerotic heart disease of native coronary artery without angina pectoris: Secondary | ICD-10-CM

## 2013-12-04 DIAGNOSIS — Z79899 Other long term (current) drug therapy: Secondary | ICD-10-CM

## 2013-12-04 LAB — LIPID PANEL
Cholesterol: 134 mg/dL (ref 0–200)
HDL: 39 mg/dL — ABNORMAL LOW (ref >39)
LDL Cholesterol: 71 mg/dL (ref 0–99)
Total CHOL/HDL Ratio: 3.4 Ratio
Triglycerides: 121 mg/dL (ref <150)
VLDL: 24 mg/dL (ref 0–40)

## 2013-12-04 LAB — CBC WITH DIFFERENTIAL/PLATELET
Basophils Absolute: 0 10*3/uL (ref 0.0–0.1)
Basophils Relative: 0 % (ref 0–1)
Eosinophils Absolute: 0.2 10*3/uL (ref 0.0–0.7)
Eosinophils Relative: 2 % (ref 0–5)
HCT: 40 % (ref 39.0–52.0)
Hemoglobin: 14.4 g/dL (ref 13.0–17.0)
Lymphocytes Relative: 29 % (ref 12–46)
Lymphs Abs: 2.4 10*3/uL (ref 0.7–4.0)
MCH: 31.2 pg (ref 26.0–34.0)
MCHC: 36 g/dL (ref 30.0–36.0)
MCV: 86.8 fL (ref 78.0–100.0)
Monocytes Absolute: 0.7 10*3/uL (ref 0.1–1.0)
Monocytes Relative: 8 % (ref 3–12)
Neutro Abs: 4.9 10*3/uL (ref 1.7–7.7)
Neutrophils Relative %: 61 % (ref 43–77)
Platelets: 285 10*3/uL (ref 150–400)
RBC: 4.61 MIL/uL (ref 4.22–5.81)
RDW: 13.5 % (ref 11.5–15.5)
WBC: 8.1 10*3/uL (ref 4.0–10.5)

## 2013-12-04 LAB — COMPLETE METABOLIC PANEL WITH GFR
ALT: 34 U/L (ref 0–53)
AST: 25 U/L (ref 0–37)
Albumin: 4 g/dL (ref 3.5–5.2)
Alkaline Phosphatase: 62 U/L (ref 39–117)
BUN: 22 mg/dL (ref 6–23)
CO2: 29 mEq/L (ref 19–32)
Calcium: 9.4 mg/dL (ref 8.4–10.5)
Chloride: 101 mEq/L (ref 96–112)
Creat: 0.96 mg/dL (ref 0.50–1.35)
GFR, Est African American: >89 mL/min
GFR, Est Non African American: 83 mL/min
Glucose, Bld: 119 mg/dL — ABNORMAL HIGH (ref 70–99)
Potassium: 5.1 mEq/L (ref 3.5–5.3)
Sodium: 139 mEq/L (ref 135–145)
Total Bilirubin: 0.4 mg/dL (ref 0.3–1.2)
Total Protein: 7.2 g/dL (ref 6.0–8.3)

## 2013-12-04 LAB — HEMOGLOBIN A1C
Hgb A1c MFr Bld: 6.4 % — ABNORMAL HIGH (ref <5.7)
Mean Plasma Glucose: 137 mg/dL — ABNORMAL HIGH (ref <117)

## 2013-12-04 LAB — PSA: PSA: 1.08 ng/mL (ref <=4.00)

## 2013-12-04 LAB — TSH: TSH: 1.668 u[IU]/mL (ref 0.350–4.500)

## 2013-12-05 LAB — VITAMIN D 25 HYDROXY (VIT D DEFICIENCY, FRACTURES): Vit D, 25-Hydroxy: 65 ng/mL (ref 30–89)

## 2013-12-10 ENCOUNTER — Ambulatory Visit (INDEPENDENT_AMBULATORY_CARE_PROVIDER_SITE_OTHER): Payer: 59 | Admitting: Family Medicine

## 2013-12-10 ENCOUNTER — Encounter: Payer: Self-pay | Admitting: Family Medicine

## 2013-12-10 VITALS — BP 110/70 | HR 76 | Temp 97.9°F | Resp 18 | Ht 70.0 in | Wt 232.0 lb

## 2013-12-10 DIAGNOSIS — Z0000 Encounter for general adult medical examination without abnormal findings: Secondary | ICD-10-CM

## 2013-12-10 NOTE — Progress Notes (Signed)
Subjective:    Patient ID: Paul Rubio, male    DOB: May 31, 1951, 63 y.o.   MRN: [id number]  HPI Patient is a very pleasant 63 year old white male with a recent left rotator cuff surgery. Due to his surgery his been relatively inactive over the last 2 months. Accordingly his hemoglobin A1c has risen from 6.0-6.4. His HDL has also fallen to 38. His past history significant for coronary artery disease, 3 diabetes, hypertension, and hyperlipidemia. Otherwise he is doing well. He is due for a pneumonia vaccine at 58. He is due for the shingles vaccine. His tetanus shot was given in 2013. He had his flu vaccine in the hospital. His colonoscopy was in 2008 and is up-to-date. Past Medical History  Diagnosis Date  . Hypertension     for approximately 5 years  . Hyperlipidemia     borderline; status post stress test x2, the last one 5-6 years ago and without significant abnormality per the patient  . Coronary artery disease 2011     2 stents placed  . Myocardial infarction 2011  . H/O hiatal hernia    Past Surgical History  Procedure Laterality Date  . Cholecystectomy    . Colonoscopy    . Coronary angioplasty      DES to mid and distal RCA 05/15/10  . Rotator cuff repair Left 10/20/2013    Dr Marlou Sa  . Shoulder arthroscopy with open rotator cuff repair Left 10/20/2013    Procedure: SHOULDER ARTHROSCOPY WITH OPEN ROTATOR CUFF REPAIR;  Surgeon: Meredith Pel, MD;  Location: [facility];  Service: Orthopedics;  Laterality: Left;  Left shoulder arthroscopy, debridement, open biceps tenodesis and rotator cuff repair.   Current Outpatient Prescriptions on File Prior to Visit  Medication Sig Dispense Refill  . aspirin EC 81 MG tablet Take 81 mg by mouth daily.      . clopidogrel (PLAVIX) 75 MG tablet Take 1 tablet (75 mg total) by mouth daily.  90 tablet  3  . metoprolol tartrate (LOPRESSOR) 25 MG tablet Take 0.5 tablets (12.5 mg total) by mouth 2 (two) times daily.  90 tablet  3  . Multiple  Vitamin (MULTIVITAMIN WITH MINERALS) TABS tablet Take 1 tablet by mouth daily.      . nitroGLYCERIN (NITROSTAT) 0.4 MG SL tablet Place 1 tablet (0.4 mg total) under the tongue every 5 (five) minutes as needed.  90 tablet  3  . olmesartan (BENICAR) 20 MG tablet Take 20 mg by mouth daily.      . Omega-3 Fatty Acids (FISH OIL TRIPLE STRENGTH) 1400 MG CAPS Take 1,400 mg by mouth daily.      Marland Kitchen oxyCODONE-acetaminophen (PERCOCET/ROXICET) 5-325 MG per tablet Take 1-2 tablets by mouth every 4 (four) hours as needed for moderate pain.  60 tablet  0  . rosuvastatin (CRESTOR) 10 MG tablet Take 1 tablet (10 mg total) by mouth daily.  30 tablet  6   No current facility-administered medications on file prior to visit.   No Known Allergies History   Social History  . Marital Status: Married    Spouse Name: N/A    Number of Children: N/A  . Years of Education: N/A   Occupational History  . Not on file.   Social History Main Topics  . Smoking status: Former Smoker    Types: Cigarettes    Quit date: 12/03/1968  . Smokeless tobacco: Never Used  . Alcohol Use: No  . Drug Use: No  . Sexual Activity: Not on  file   Other Topics Concern  . Not on file   Social History Narrative   Quit tobacco greater than 20 years ago with approximately 10 pack-year history. Denies alcohol or drug abuse   Family History  Problem Relation Age of Onset  . Heart disease Father   . Heart attack    . Sudden death Paternal Uncle     possible MI      Review of Systems  All other systems reviewed and are negative.       Objective:   Physical Exam  Vitals reviewed. Constitutional: He is oriented to person, place, and time. He appears well-developed and well-nourished. No distress.  HENT:  Head: Normocephalic and atraumatic.  Right Ear: External ear normal.  Left Ear: External ear normal.  Nose: Nose normal.  Mouth/Throat: Oropharynx is clear and moist. No oropharyngeal exudate.  Eyes: Conjunctivae and EOM  are normal. Pupils are equal, round, and reactive to light. Right eye exhibits no discharge. Left eye exhibits no discharge. No scleral icterus.  Neck: Normal range of motion. Neck supple. No JVD present. No tracheal deviation present. No thyromegaly present.  Cardiovascular: Normal rate, regular rhythm, normal heart sounds and intact distal pulses.  Exam reveals no gallop and no friction rub.   No murmur heard. Pulmonary/Chest: Effort normal and breath sounds normal. No stridor. No respiratory distress. He has no wheezes. He has no rales. He exhibits no tenderness.  Abdominal: Soft. Bowel sounds are normal. He exhibits no distension and no mass. There is no tenderness. There is no rebound and no guarding.  Genitourinary: Rectum normal, prostate normal and penis normal.  Musculoskeletal: Normal range of motion. He exhibits no edema and no tenderness.  Lymphadenopathy:    He has no cervical adenopathy.  Neurological: He is alert and oriented to person, place, and time. He has normal reflexes. He displays normal reflexes. No cranial nerve deficit. He exhibits normal muscle tone. Coordination normal.  Skin: Skin is warm. No rash noted. He is not diaphoretic. No erythema. No pallor.  Psychiatric: He has a normal mood and affect. His behavior is normal. Judgment and thought content normal.   Lab on 12/04/2013  Component Date Value Range Status  . WBC 12/04/2013 8.1  4.0 - 10.5 K/uL Final  . RBC 12/04/2013 4.61  4.22 - 5.81 MIL/uL Final  . Hemoglobin 12/04/2013 14.4  13.0 - 17.0 g/dL Final  . HCT 12/04/2013 40.0  39.0 - 52.0 % Final  . MCV 12/04/2013 86.8  78.0 - 100.0 fL Final  . MCH 12/04/2013 31.2  26.0 - 34.0 pg Final  . MCHC 12/04/2013 36.0  30.0 - 36.0 g/dL Final  . RDW 12/04/2013 13.5  11.5 - 15.5 % Final  . Platelets 12/04/2013 285  150 - 400 K/uL Final  . Neutrophils Relative % 12/04/2013 61  43 - 77 % Final  . Neutro Abs 12/04/2013 4.9  1.7 - 7.7 K/uL Final  . Lymphocytes Relative  12/04/2013 29  12 - 46 % Final  . Lymphs Abs 12/04/2013 2.4  0.7 - 4.0 K/uL Final  . Monocytes Relative 12/04/2013 8  3 - 12 % Final  . Monocytes Absolute 12/04/2013 0.7  0.1 - 1.0 K/uL Final  . Eosinophils Relative 12/04/2013 2  0 - 5 % Final  . Eosinophils Absolute 12/04/2013 0.2  0.0 - 0.7 K/uL Final  . Basophils Relative 12/04/2013 0  0 - 1 % Final  . Basophils Absolute 12/04/2013 0.0  0.0 - 0.1 K/uL Final  .  Smear Review 12/04/2013 Criteria for review not met   Final  . Cholesterol 12/04/2013 134  0 - 200 mg/dL Final   Comment: ATP III Classification:                                < 200        mg/dL        Desirable                               200 - 239     mg/dL        Borderline High                               >= 240        mg/dL        High                             . Triglycerides 12/04/2013 121  <150 mg/dL Final  . HDL 12/04/2013 39* >39 mg/dL Final  . Total CHOL/HDL Ratio 12/04/2013 3.4   Final  . VLDL 12/04/2013 24  0 - 40 mg/dL Final  . LDL Cholesterol 12/04/2013 71  0 - 99 mg/dL Final   Comment:                            Total Cholesterol/HDL Ratio:CHD Risk                                                 Coronary Heart Disease Risk Table                                                                 Men       Women                                   1/2 Average Risk              3.4        3.3                                       Average Risk              5.0        4.4                                    2X Average Risk              9.6        7.1  3X Average Risk             23.4       11.0                          Use the calculated Patient Ratio above and the CHD Risk table                           to determine the patient's CHD Risk.                          ATP III Classification (LDL):                                < 100        mg/dL         Optimal                               100 - 129     mg/dL         Near or Above  Optimal                               130 - 159     mg/dL         Borderline High                               160 - 189     mg/dL         High                                > 190        mg/dL         Very High                             . Hemoglobin A1C 12/04/2013 6.4* <5.7 % Final   Comment:                                                                                                 According to the ADA Clinical Practice Recommendations for 2011, when                          HbA1c is used as a screening test:                                                       >=6.5%  Diagnostic of Diabetes Mellitus                                     (if abnormal result is confirmed)                                                     5.7-6.4%   Increased risk of developing Diabetes Mellitus                                                     References:Diagnosis and Classification of Diabetes Mellitus,Diabetes                          [id number],9381,82(XHBZJ 1):S62-S69 and Standards of Medical Care in                                  Diabetes - 2011,Diabetes Care,2011,34 (Suppl 1):S11-S61.                             . Mean Plasma Glucose 12/04/2013 137* <117 mg/dL Final  . TSH 12/04/2013 1.668  0.350 - 4.500 uIU/mL Final  . Vit D, 25-Hydroxy 12/04/2013 65  30 - 89 ng/mL Final   Comment: This assay accurately quantifies Vitamin D, which is the sum of the                          25-Hydroxy forms of Vitamin D2 and D3.  Studies have shown that the                          optimum concentration of 25-Hydroxy Vitamin D is 30 ng/mL or higher.                           Concentrations of Vitamin D between 20 and 29 ng/mL are considered to                          be insufficient and concentrations less than 20 ng/mL are considered                          to be deficient for Vitamin D.  . PSA 12/04/2013 1.08  <=4.00 ng/mL Final   Comment: Test Methodology: ECLIA PSA (Electrochemiluminescence  Immunoassay)                                                     For PSA values from 2.5-4.0, particularly in younger men <60 years                          old, the AUA  and NCCN suggest testing for % Free PSA (3515) and                          evaluation of the rate of increase in PSA (PSA velocity).  . Sodium 12/04/2013 139  135 - 145 mEq/L Final  . Potassium 12/04/2013 5.1  3.5 - 5.3 mEq/L Final  . Chloride 12/04/2013 101  96 - 112 mEq/L Final  . CO2 12/04/2013 29  19 - 32 mEq/L Final  . Glucose, Bld 12/04/2013 119* 70 - 99 mg/dL Final  . BUN 12/04/2013 22  6 - 23 mg/dL Final  . Creat 12/04/2013 0.96  0.50 - 1.35 mg/dL Final  . Total Bilirubin 12/04/2013 0.4  0.3 - 1.2 mg/dL Final  . Alkaline Phosphatase 12/04/2013 62  39 - 117 U/L Final  . AST 12/04/2013 25  0 - 37 U/L Final  . ALT 12/04/2013 34  0 - 53 U/L Final  . Total Protein 12/04/2013 7.2  6.0 - 8.3 g/dL Final  . Albumin 12/04/2013 4.0  3.5 - 5.2 g/dL Final  . Calcium 12/04/2013 9.4  8.4 - 10.5 mg/dL Final  . GFR, Est African American 12/04/2013 >89   Final  . GFR, Est Non African American 12/04/2013 83   Final   Comment:                            The estimated GFR is a calculation valid for adults (>=39 years old)                          that uses the CKD-EPI algorithm to adjust for age and sex. It is                            not to be used for children, pregnant women, hospitalized patients,                             patients on dialysis, or with rapidly changing kidney function.                          According to the NKDEP, eGFR >89 is normal, 60-89 shows mild                          impairment, 30-59 shows moderate impairment, 15-29 shows severe                          impairment and <15 is ESRD.                                      Assessment & Plan:  1. Routine general medical examination at a health care facility Physical exam is completely normal. I recommended Zostavax. Also recommended pneumonia  vaccine after he turns 14. I recommend he increase his aerobic exercise and decrease the carbohydrates in his diet. I would like to recheck his hemoglobin A1c in 3 months. Otherwise physical exam was completely normal. His preventive care is up to date.  She does have some symptoms of overactive bladder. If his symptoms worsen I would recommend  vesicare.

## 2014-01-27 ENCOUNTER — Other Ambulatory Visit: Payer: Self-pay

## 2014-01-27 MED ORDER — ROSUVASTATIN CALCIUM 10 MG PO TABS: 10.0000 mg | ORAL_TABLET | Freq: Every day | ORAL | Status: DC

## 2014-02-01 ENCOUNTER — Ambulatory Visit (INDEPENDENT_AMBULATORY_CARE_PROVIDER_SITE_OTHER): Payer: 59 | Admitting: Physician Assistant

## 2014-02-01 ENCOUNTER — Encounter: Payer: Self-pay | Admitting: Physician Assistant

## 2014-02-01 VITALS — BP 114/70 | HR 76 | Temp 98.6°F | Resp 18 | Ht 68.0 in | Wt 230.0 lb

## 2014-02-01 DIAGNOSIS — B9689 Other specified bacterial agents as the cause of diseases classified elsewhere: Principal | ICD-10-CM

## 2014-02-01 DIAGNOSIS — A499 Bacterial infection, unspecified: Secondary | ICD-10-CM

## 2014-02-01 DIAGNOSIS — J988 Other specified respiratory disorders: Secondary | ICD-10-CM

## 2014-02-01 MED ORDER — PREDNISONE 20 MG PO TABS: ORAL_TABLET | ORAL | Status: DC

## 2014-02-01 MED ORDER — ALBUTEROL SULFATE HFA 108 (90 BASE) MCG/ACT IN AERS: 2.0000 | INHALATION_SPRAY | Freq: Four times a day (QID) | RESPIRATORY_TRACT | Status: DC | PRN

## 2014-02-01 MED ORDER — AZITHROMYCIN 250 MG PO TABS: ORAL_TABLET | ORAL | Status: DC

## 2014-02-01 NOTE — Progress Notes (Signed)
Patient ID: Paul Rubio MRN: [id number], DOB: 09-28-1951, 63 y.o. Date of Encounter: 02/01/2014, 5:10 PM    Chief Complaint:  Chief Complaint  Patient presents with  . c/o terrible cough x 4 days    back of head and neck hurt from cough, wheezing     HPI: 63 y.o. year old white male says that he has been sick for about 3 weeks with head and nasal congestion. Says he has also had chest congestion but the chest congestion and cough has gotten much worse over the past 4 days. Has had no known fever. Coughing up really thick dark phlegm. Does not smoke and only smoked very small amount in the past. However he says in the past he had 3 years in a row in  which he had bronchitis. Says that over the last few days he has been hearing wheezing at times. Works as a Engineer, maintenance.     Home Meds: See attached medication section for any medications that were entered at today's visit. The computer does not put those onto this list.The following list is a list of meds entered prior to today's visit.   Current Outpatient Prescriptions on File Prior to Visit  Medication Sig Dispense Refill  . aspirin EC 81 MG tablet Take 81 mg by mouth daily.      . clopidogrel (PLAVIX) 75 MG tablet Take 1 tablet (75 mg total) by mouth daily.  90 tablet  3  . fexofenadine (ALLEGRA) 180 MG tablet Take 180 mg by mouth daily.      . metoprolol tartrate (LOPRESSOR) 25 MG tablet Take 0.5 tablets (12.5 mg total) by mouth 2 (two) times daily.  90 tablet  3  . Multiple Vitamin (MULTIVITAMIN WITH MINERALS) TABS tablet Take 1 tablet by mouth daily.      . nitroGLYCERIN (NITROSTAT) 0.4 MG SL tablet Place 1 tablet (0.4 mg total) under the tongue every 5 (five) minutes as needed.  90 tablet  3  . olmesartan (BENICAR) 20 MG tablet Take 20 mg by mouth daily.      . Omega-3 Fatty Acids (FISH OIL TRIPLE STRENGTH) 1400 MG CAPS Take 1,400 mg by mouth daily.      . ranitidine (ZANTAC) 75 MG tablet Take 75 mg by mouth at bedtime.        . rosuvastatin (CRESTOR) 10 MG tablet Take 1 tablet (10 mg total) by mouth daily.  30 tablet  6   No current facility-administered medications on file prior to visit.    Allergies: No Known Allergies    Review of Systems: See HPI for pertinent ROS. All other ROS negative.    Physical Exam: Blood pressure 114/70, pulse 76, temperature 98.6 F (37 C), temperature source Oral, resp. rate 18, height 5\' 8"  (1.727 m), weight 230 lb (104.327 kg)., Body mass index is 34.98 kg/(m^2). General:  Well-nourished well-developed white male.  Sounds very nasally and congested when he talks. Appears in no acute distress. HEENT: Normocephalic, atraumatic, eyes without discharge, sclera non-icteric, nares are without discharge. Bilateral auditory canals clear, TM's are without perforation, pearly grey and translucent with reflective cone of light bilaterally. Oral cavity moist, posterior pharynx without exudate, erythema, peritonsillar abscess. No tenderness with percussion of frontal and maxillary sinuses.  Neck: Supple. No thyromegaly. No lymphadenopathy. Lungs: Clear bilaterally to auscultation without wheezes, rales, or rhonchi. Breathing is unlabored. I hear no wheezes on exam at this time. Heart: Regular rhythm. No murmurs, rubs, or gallops. Msk:  Strength and tone normal for age. Extremities/Skin: Warm and dry.  Neuro: Alert and oriented X 3. Moves all extremities spontaneously. Gait is normal. CNII-XII grossly in tact. Psych:  Responds to questions appropriately with a normal affect.     ASSESSMENT AND PLAN:  [age] y.o. year old male with  1. Bacterial respiratory infection - azithromycin (ZITHROMAX) 250 MG tablet; Day 1: Take 2 daily.  Days 2-5: Take 1 daily.  Dispense: 6 tablet; Refill: 0 - predniSONE (DELTASONE) 20 MG tablet; Take 3 daily for 2 days, then 2 daily for 2 days, then 1 daily for 2 days.  Dispense: 12 tablet; Refill: 0 - albuterol (PROVENTIL HFA;VENTOLIN HFA) 108 (90 BASE) MCG/ACT  inhaler; Inhale 2 puffs into the lungs every 6 (six) hours as needed for wheezing or shortness of breath.  Dispense: 1 Inhaler; Refill: 0  To continue using the Mucinex DM as expectorant. Follow up if symptoms worsen or do not resolve within one week after completion of antibiotic.  [address]igned, Mary Beth [place], [place], [facility] 02/01/2014 5:10 PM

## 2014-02-10 ENCOUNTER — Ambulatory Visit (INDEPENDENT_AMBULATORY_CARE_PROVIDER_SITE_OTHER): Payer: 59 | Admitting: Family Medicine

## 2014-02-10 ENCOUNTER — Encounter: Payer: Self-pay | Admitting: Family Medicine

## 2014-02-10 VITALS — BP 128/64 | HR 86 | Temp 98.9°F | Resp 20 | Ht 68.0 in | Wt 228.0 lb

## 2014-02-10 DIAGNOSIS — J209 Acute bronchitis, unspecified: Secondary | ICD-10-CM

## 2014-02-10 DIAGNOSIS — J019 Acute sinusitis, unspecified: Secondary | ICD-10-CM

## 2014-02-10 DIAGNOSIS — J069 Acute upper respiratory infection, unspecified: Secondary | ICD-10-CM

## 2014-02-10 MED ORDER — GUAIFENESIN-CODEINE 100-10 MG/5ML PO SOLN: 10.0000 mL | Freq: Four times a day (QID) | ORAL | Status: DC | PRN

## 2014-02-10 MED ORDER — FLUTICASONE PROPIONATE 50 MCG/ACT NA SUSP: 2.0000 | Freq: Every day | NASAL | Status: DC

## 2014-02-10 MED ORDER — AMOXICILLIN-POT CLAVULANATE 875-125 MG PO TABS: 1.0000 | ORAL_TABLET | Freq: Two times a day (BID) | ORAL | Status: DC

## 2014-02-10 NOTE — Progress Notes (Signed)
Patient ID: Paul Rubio, male   DOB: December 17, 1950, 63 y.o.   MRN: [id number]   Subjective:    Patient ID: Paul CollaDonald G Rubio, male    DOB: December 17, 1950, 63 y.o.   MRN: [id number]  Patient presents for Illness  patient here with sinus drainage pressure is been worsening over the past week. He initially started with symptoms about 2-1/2 weeks ago he was seen last week and given azithromycin as well as prednisone as he also had some cough with some wheezing. The cough still persists and is productive as well as a sinus drainage which is numb a thick yellow color. He's not had any fever. He's not had any difficulty breathing with the exception of the nasal congestion. He's not had any recent wheezing or shortness of breath.    Review Of Systems:  GEN- + fatigue, fever, weight loss,weakness, recent illness HEENT- denies eye drainage, change in vision,+ nasal discharge, CVS- denies chest pain, palpitations RESP- denies SOB, +cough, wheezey Neuro- denies headache, dizziness, syncope, seizure activity       Objective:    BP 128/64  Pulse 86  Temp(Src) 98.9 F (37.2 C)  Resp 20  Ht 5\' 8"  (1.727 m)  Wt 228 lb (103.42 kg)  BMI 34.68 kg/m2 GEN- NAD, alert and oriented x3 HEENT- PERRL, EOMI, non injected sclera, pink conjunctiva, MMM, oropharynx mild injection, TM clear bilat no effusion,  + maxillary sinus tenderness, inflammed turbinates,  Nasal drainage  Neck- Supple, shotty LAD CVS- RRR, soft systolic murmur RESP-CTAB EXT- No edema Pulses- Radial 2+         Assessment & Plan:      Problem List Items Addressed This Visit   None      Note: This dictation was prepared with Dragon dictation along with smaller phrase technology. Any transcriptional errors that result from this process are unintentional.

## 2014-02-10 NOTE — Patient Instructions (Signed)
Start the antibiotics Cough medicine called in  Use flonase Okay to use mucinex in the morning  F/U if not better

## 2014-02-10 NOTE — Assessment & Plan Note (Signed)
Change to Augmentin twice a day  Mucinex, Flonase

## 2014-02-10 NOTE — Assessment & Plan Note (Signed)
Robitussin with codiene S/P prednisone  No red flags If no improvement CXR

## 2014-05-19 ENCOUNTER — Other Ambulatory Visit: Payer: Self-pay | Admitting: Cardiology

## 2014-06-17 ENCOUNTER — Other Ambulatory Visit: Payer: Self-pay

## 2014-06-17 MED ORDER — METOPROLOL TARTRATE 25 MG PO TABS: 12.5000 mg | ORAL_TABLET | Freq: Two times a day (BID) | ORAL | Status: DC

## 2014-07-30 ENCOUNTER — Other Ambulatory Visit: Payer: Medicare Other

## 2014-08-06 ENCOUNTER — Encounter: Payer: Self-pay | Admitting: Cardiovascular Disease

## 2014-08-06 ENCOUNTER — Ambulatory Visit (INDEPENDENT_AMBULATORY_CARE_PROVIDER_SITE_OTHER): Payer: Medicare Other | Admitting: Cardiovascular Disease

## 2014-08-06 VITALS — BP 115/68 | HR 70 | Ht 68.0 in | Wt 230.0 lb

## 2014-08-06 DIAGNOSIS — I2510 Atherosclerotic heart disease of native coronary artery without angina pectoris: Secondary | ICD-10-CM

## 2014-08-06 DIAGNOSIS — I10 Essential (primary) hypertension: Secondary | ICD-10-CM

## 2014-08-06 NOTE — Progress Notes (Signed)
HPI:  63 year old gentleman presenting for followup evaluation. He has been followed by Dr. wall. The patient has coronary artery disease and he had a non-ST elevation infarction in 2011. He was noted to have severe stenoses in the mid and distal RCA, both lesions treated with drug-eluting stent platforms. He had mild nonobstructive disease in the LAD and left circumflex vessels. The patient also had mild segmental LV dysfunction with inferior wall hypokinesis and an ejection fraction of 50%.  Lab work from January 2015 showed normal liver function tests, cholesterol 134, trig distress 121, HDL 39, and LDL 71.  The patient is doing well. He denies any recurrence of exertional chest discomfort or diaphoresis (symptoms he experienced at the time of his NSTEMI). He remains active and is working 12-14 hours/day. No dyspnea, edema, or palpitations.  Outpatient Encounter Prescriptions as of 08/06/2014  Medication Sig  . aspirin EC 81 MG tablet Take 81 mg by mouth daily.  . Cholecalciferol (VITAMIN D) 2000 UNITS tablet Take 2,000 Units by mouth 2 (two) times daily.  . clopidogrel (PLAVIX) 75 MG tablet TAKE 1 TABLET BY MOUTH EVERY DAY  . fexofenadine (ALLEGRA) 180 MG tablet Take 180 mg by mouth daily.  . metoprolol tartrate (LOPRESSOR) 25 MG tablet Take 0.5 tablets (12.5 mg total) by mouth 2 (two) times daily.  . Multiple Vitamin (MULTIVITAMIN WITH MINERALS) TABS tablet Take 1 tablet by mouth daily.  . nitroGLYCERIN (NITROSTAT) 0.4 MG SL tablet Place 1 tablet (0.4 mg total) under the tongue every 5 (five) minutes as needed.  Marland Kitchen olmesartan (BENICAR) 20 MG tablet Take 20 mg by mouth daily.  . Omega-3 Fatty Acids (FISH OIL TRIPLE STRENGTH) 1400 MG CAPS Take 1,400 mg by mouth daily.  . ranitidine (ZANTAC) 75 MG tablet Take 75 mg by mouth at bedtime.  . rosuvastatin (CRESTOR) 10 MG tablet Take 1 tablet (10 mg total) by mouth daily.  . [DISCONTINUED] albuterol (PROVENTIL HFA;VENTOLIN HFA) 108 (90 BASE)  MCG/ACT inhaler Inhale 2 puffs into the lungs every 6 (six) hours as needed for wheezing or shortness of breath.  . [DISCONTINUED] amoxicillin-clavulanate (AUGMENTIN) 875-125 MG per tablet Take 1 tablet by mouth 2 (two) times daily.  . [DISCONTINUED] fluticasone (FLONASE) 50 MCG/ACT nasal spray Place 2 sprays into both nostrils daily.  . [DISCONTINUED] guaiFENesin-codeine 100-10 MG/5ML syrup Take 10 mLs by mouth every 6 (six) hours as needed for cough.    No Known Allergies  Past Medical History  Diagnosis Date  . Hypertension     for approximately 5 years  . Hyperlipidemia     borderline; status post stress test x2, the last one 5-6 years ago and without significant abnormality per the patient  . Coronary artery disease 2011     2 stents placed  . Myocardial infarction 2011  . H/O hiatal hernia     ROS: Negative except as per HPI  BP 115/68  Pulse 70  Ht  (1.727 m)  Wt 230 lb (104.327 kg)  BMI 34.98 kg/m2  PHYSICAL EXAM: Pt is alert and oriented, NAD HEENT: normal Neck: JVP - normal, carotids 2+= without bruits Lungs: CTA bilaterally CV: RRR without murmur or gallop Abd: soft, NT, Positive BS, no hepatomegaly Ext: no C/C/E, distal pulses intact and equal Skin: warm/dry no rash  EKG:  Normal sinus rhythm 70 beats per minute, within normal limits.  ASSESSMENT AND PLAN: 1. CAD, native vessel without angina. Pt seems to be doing fine. We discussed risks/benefits of long-term DAPT. Considering  his low bleeding risk will continue current Rx. No changes made to his medical regimen.  2. HTN - BP well-controlled on metoprolol and olmesartan.  3. Hyperlipidemia - labs from 12/04/2013 reviewed. Lipids excellent with cholesterol 134, Trig 121, HDL 39, and LDL 71.  I'll see him back in one year for follow-up.  Tonny Bollman MD 08/06/2014 12:09 PM

## 2014-08-06 NOTE — Patient Instructions (Signed)
Your physician wants you to follow-up in: 1 YEAR with Dr Cooper.  You will receive a reminder letter in the mail two months in advance. If you don't receive a letter, please call our office to schedule the follow-up appointment.  Your physician recommends that you continue on your current medications as directed. Please refer to the Current Medication list given to you today.  

## 2014-08-07 ENCOUNTER — Encounter: Payer: Self-pay | Admitting: Cardiovascular Disease

## 2014-08-13 ENCOUNTER — Telehealth: Payer: Self-pay | Admitting: Cardiovascular Disease

## 2014-08-13 MED ORDER — CLOPIDOGREL BISULFATE 75 MG PO TABS: 75.0000 mg | ORAL_TABLET | Freq: Every day | ORAL | Status: DC

## 2014-08-13 MED ORDER — METOPROLOL TARTRATE 25 MG PO TABS: 12.5000 mg | ORAL_TABLET | Freq: Two times a day (BID) | ORAL | Status: DC

## 2014-08-13 MED ORDER — ROSUVASTATIN CALCIUM 10 MG PO TABS: 10.0000 mg | ORAL_TABLET | Freq: Every day | ORAL | Status: DC

## 2014-08-13 MED ORDER — ROSUVASTATIN CALCIUM 10 MG PO TABS
10.0000 mg | ORAL_TABLET | Freq: Every day | ORAL | Status: DC
Start: 2014-08-13 — End: 2014-08-13

## 2014-08-13 NOTE — Telephone Encounter (Signed)
New message     Pt saw Dr Excell Seltzer last Friday.  Need to pick up presc so that he can send them to a new mail order company.  Need 90day supply.  Call when presc are ready.  He has to mail the presc in with the form for the first time ordering.

## 2014-08-13 NOTE — Telephone Encounter (Signed)
Pt's wife called to get prescriptions so she can  send to the  mail order pharmacy. Prescription for Crestor, Metoprolol and Plavix placed at the front desk for her to peak up. Pt's wide aware.

## 2014-08-22 ENCOUNTER — Other Ambulatory Visit: Payer: Self-pay | Admitting: Cardiovascular Disease

## 2014-08-22 ENCOUNTER — Other Ambulatory Visit: Payer: Self-pay | Admitting: Family Medicine

## 2014-08-23 NOTE — Telephone Encounter (Signed)
Refill appropriate and filled per protocol. 

## 2014-09-28 ENCOUNTER — Ambulatory Visit (INDEPENDENT_AMBULATORY_CARE_PROVIDER_SITE_OTHER): Payer: Medicare Other | Admitting: Family Medicine

## 2014-09-28 DIAGNOSIS — Z23 Encounter for immunization: Secondary | ICD-10-CM

## 2014-11-24 ENCOUNTER — Other Ambulatory Visit: Payer: Self-pay | Admitting: Family Medicine

## 2014-12-13 ENCOUNTER — Other Ambulatory Visit: Payer: Medicare Other

## 2014-12-13 DIAGNOSIS — R5383 Other fatigue: Secondary | ICD-10-CM

## 2014-12-13 DIAGNOSIS — Z0000 Encounter for general adult medical examination without abnormal findings: Secondary | ICD-10-CM

## 2014-12-13 DIAGNOSIS — E785 Hyperlipidemia, unspecified: Secondary | ICD-10-CM

## 2014-12-13 DIAGNOSIS — I25759 Atherosclerosis of native coronary artery of transplanted heart with unspecified angina pectoris: Secondary | ICD-10-CM

## 2014-12-13 DIAGNOSIS — I10 Essential (primary) hypertension: Secondary | ICD-10-CM

## 2014-12-13 DIAGNOSIS — Z79899 Other long term (current) drug therapy: Secondary | ICD-10-CM

## 2014-12-13 LAB — COMPLETE METABOLIC PANEL WITH GFR
ALT: 31 U/L (ref 0–53)
AST: 23 U/L (ref 0–37)
Albumin: 3.9 g/dL (ref 3.5–5.2)
Alkaline Phosphatase: 56 U/L (ref 39–117)
BUN: 33 mg/dL — ABNORMAL HIGH (ref 6–23)
CO2: 26 mEq/L (ref 19–32)
Calcium: 9.3 mg/dL (ref 8.4–10.5)
Chloride: 104 mEq/L (ref 96–112)
Creat: 1.08 mg/dL (ref 0.50–1.35)
GFR, Est African American: 83 mL/min
GFR, Est Non African American: 72 mL/min
Glucose, Bld: 109 mg/dL — ABNORMAL HIGH (ref 70–99)
Potassium: 5.2 mEq/L (ref 3.5–5.3)
Sodium: 141 mEq/L (ref 135–145)
Total Bilirubin: 0.4 mg/dL (ref 0.2–1.2)
Total Protein: 6.8 g/dL (ref 6.0–8.3)

## 2014-12-13 LAB — CBC WITH DIFFERENTIAL/PLATELET
Basophils Absolute: 0 10*3/uL (ref 0.0–0.1)
Basophils Relative: 0 % (ref 0–1)
Eosinophils Absolute: 0.2 10*3/uL (ref 0.0–0.7)
Eosinophils Relative: 2 % (ref 0–5)
HCT: 42.6 % (ref 39.0–52.0)
Hemoglobin: 14.4 g/dL (ref 13.0–17.0)
Lymphocytes Relative: 28 % (ref 12–46)
Lymphs Abs: 2.2 10*3/uL (ref 0.7–4.0)
MCH: 30.3 pg (ref 26.0–34.0)
MCHC: 33.8 g/dL (ref 30.0–36.0)
MCV: 89.5 fL (ref 78.0–100.0)
MPV: 10 fL (ref 8.6–12.4)
Monocytes Absolute: 0.6 10*3/uL (ref 0.1–1.0)
Monocytes Relative: 8 % (ref 3–12)
Neutro Abs: 4.9 10*3/uL (ref 1.7–7.7)
Neutrophils Relative %: 62 % (ref 43–77)
Platelets: 253 10*3/uL (ref 150–400)
RBC: 4.76 MIL/uL (ref 4.22–5.81)
RDW: 13.1 % (ref 11.5–15.5)
WBC: 7.9 10*3/uL (ref 4.0–10.5)

## 2014-12-13 LAB — TSH: TSH: 0.977 u[IU]/mL (ref 0.350–4.500)

## 2014-12-13 LAB — LIPID PANEL
Cholesterol: 123 mg/dL (ref 0–200)
HDL: 40 mg/dL (ref >39)
LDL Cholesterol: 68 mg/dL (ref 0–99)
Total CHOL/HDL Ratio: 3.1 Ratio
Triglycerides: 75 mg/dL (ref <150)
VLDL: 15 mg/dL (ref 0–40)

## 2014-12-14 LAB — PSA, MEDICARE: PSA: 1.29 ng/mL (ref <=4.00)

## 2014-12-14 LAB — VITAMIN D 25 HYDROXY (VIT D DEFICIENCY, FRACTURES): Vit D, 25-Hydroxy: 69 ng/mL (ref 30–100)

## 2014-12-17 ENCOUNTER — Encounter: Payer: Self-pay | Admitting: Family Medicine

## 2014-12-17 ENCOUNTER — Ambulatory Visit (INDEPENDENT_AMBULATORY_CARE_PROVIDER_SITE_OTHER): Payer: Medicare Other | Admitting: Family Medicine

## 2014-12-17 VITALS — BP 110/80 | HR 76 | Temp 98.4°F | Resp 18 | Ht 68.0 in | Wt 231.0 lb

## 2014-12-17 DIAGNOSIS — E118 Type 2 diabetes mellitus with unspecified complications: Secondary | ICD-10-CM | POA: Insufficient documentation

## 2014-12-17 DIAGNOSIS — I2510 Atherosclerotic heart disease of native coronary artery without angina pectoris: Secondary | ICD-10-CM | POA: Diagnosis not present

## 2014-12-17 DIAGNOSIS — Z0000 Encounter for general adult medical examination without abnormal findings: Secondary | ICD-10-CM | POA: Diagnosis not present

## 2014-12-17 DIAGNOSIS — R7303 Prediabetes: Secondary | ICD-10-CM | POA: Insufficient documentation

## 2014-12-17 DIAGNOSIS — Z23 Encounter for immunization: Secondary | ICD-10-CM

## 2014-12-17 MED ORDER — ATORVASTATIN CALCIUM 40 MG PO TABS: 40.0000 mg | ORAL_TABLET | Freq: Every day | ORAL | Status: DC

## 2014-12-17 MED ORDER — VALSARTAN 160 MG PO TABS: 160.0000 mg | ORAL_TABLET | Freq: Every day | ORAL | Status: DC

## 2014-12-17 MED ORDER — ZOSTER VACCINE LIVE 19400 UNT/0.65ML ~~LOC~~ SOLR: 0.6500 mL | Freq: Once | SUBCUTANEOUS | Status: DC

## 2014-12-17 NOTE — Progress Notes (Signed)
Subjective:    Patient ID: Paul Rubio, male    DOB: 1950-12-20, 64 y.o.   MRN: [id number]  HPI Patient is here today for complete physical exam. He like to switch to a car and Crestor due to cost. He has a significant history for coronary artery disease as well as prediabetes. His goal LDL cholesterol is less than 70. His blood pressures well controlled today 110/80. His flu shot is up-to-date. The patient is due for Pneumovax 23. He is also due for a shingles vaccine. He is overdue for colonoscopy. His prostate exam is performed today  Lab on 12/13/2014  Component Date Value Ref Range Status  . Cholesterol 12/13/2014 123  0 - 200 mg/dL Final   Comment: ATP III Classification:       < 200        mg/dL        Desirable      200 - 239     mg/dL        Borderline High      >= 240        mg/dL        High     . Triglycerides 12/13/2014 75  <150 mg/dL Final  . HDL 12/13/2014 40  >39 mg/dL Final  . Total CHOL/HDL Ratio 12/13/2014 3.1   Final  . VLDL 12/13/2014 15  0 - 40 mg/dL Final  . LDL Cholesterol 12/13/2014 68  0 - 99 mg/dL Final   Comment:   Total Cholesterol/HDL Ratio:CHD Risk                        Coronary Heart Disease Risk Table                                        Men       Women          1/2 Average Risk              3.4        3.3              Average Risk              5.0        4.4           2X Average Risk              9.6        7.1           3X Average Risk             23.4       11.0 Use the calculated Patient Ratio above and the CHD Risk table  to determine the patient's CHD Risk. ATP III Classification (LDL):       < 100        mg/dL         Optimal      100 - 129     mg/dL         Near or Above Optimal      130 - 159     mg/dL         Borderline High      160 - 189     mg/dL         High       >  190        mg/dL         Very High     . WBC 12/13/2014 7.9  4.0 - 10.5 K/uL Final  . RBC 12/13/2014 4.76  4.22 - 5.81 MIL/uL Final  . Hemoglobin 12/13/2014  14.4  13.0 - 17.0 g/dL Final  . HCT 12/13/2014 42.6  39.0 - 52.0 % Final  . MCV 12/13/2014 89.5  78.0 - 100.0 fL Final  . MCH 12/13/2014 30.3  26.0 - 34.0 pg Final  . MCHC 12/13/2014 33.8  30.0 - 36.0 g/dL Final  . RDW 12/13/2014 13.1  11.5 - 15.5 % Final  . Platelets 12/13/2014 253  150 - 400 K/uL Final  . MPV 12/13/2014 10.0  8.6 - 12.4 fL Final   ** Please note change in reference range(s). **  . Neutrophils Relative % 12/13/2014 62  43 - 77 % Final  . Neutro Abs 12/13/2014 4.9  1.7 - 7.7 K/uL Final  . Lymphocytes Relative 12/13/2014 28  12 - 46 % Final  . Lymphs Abs 12/13/2014 2.2  0.7 - 4.0 K/uL Final  . Monocytes Relative 12/13/2014 8  3 - 12 % Final  . Monocytes Absolute 12/13/2014 0.6  0.1 - 1.0 K/uL Final  . Eosinophils Relative 12/13/2014 2  0 - 5 % Final  . Eosinophils Absolute 12/13/2014 0.2  0.0 - 0.7 K/uL Final  . Basophils Relative 12/13/2014 0  0 - 1 % Final  . Basophils Absolute 12/13/2014 0.0  0.0 - 0.1 K/uL Final  . Smear Review 12/13/2014 Criteria for review not met   Final  . TSH 12/13/2014 0.977  0.350 - 4.500 uIU/mL Final  . Vit D, 25-Hydroxy 12/13/2014 69  30 - 100 ng/mL Final   Comment: ** Please note change in reference range(s). ** Vitamin D Status           25-OH Vitamin D        Deficiency                <20 ng/mL        Insufficiency         20 - 29 ng/mL        Optimal             > or = 30 ng/mL   For 25-OH Vitamin D testing on patients on D2-supplementation and patients for whom quantitation of D2 and D3 fractions is required, the QuestAssureD 25-OH VIT D, (D2,D3), LC/MS/MS is recommended: order code [phone] (patients > 2 yrs).   . PSA 12/13/2014 1.29  <=4.00 ng/mL Final   Comment: Test Methodology: ECLIA PSA (Electrochemiluminescence Immunoassay)   For PSA values from 2.5-4.0, particularly in younger men <63 years old, the AUA and NCCN suggest testing for % Free PSA (3515) and evaluation of the rate of increase in PSA (PSA velocity).   . Sodium  12/13/2014 141  135 - 145 mEq/L Final  . Potassium 12/13/2014 5.2  3.5 - 5.3 mEq/L Final  . Chloride 12/13/2014 104  96 - 112 mEq/L Final  . CO2 12/13/2014 26  19 - 32 mEq/L Final  . Glucose, Bld 12/13/2014 109* 70 - 99 mg/dL Final  . BUN 12/13/2014 33* 6 - 23 mg/dL Final  . Creat 12/13/2014 1.08  0.50 - 1.35 mg/dL Final  . Total Bilirubin 12/13/2014 0.4  0.2 - 1.2 mg/dL Final  . Alkaline Phosphatase 12/13/2014 56  39 - 117 U/L Final  . AST 12/13/2014 23  0 - 37 U/L Final  . ALT 12/13/2014 31  0 - 53 U/L Final  . Total Protein 12/13/2014 6.8  6.0 - 8.3 g/dL Final  . Albumin 12/13/2014 3.9  3.5 - 5.2 g/dL Final  . Calcium 12/13/2014 9.3  8.4 - 10.5 mg/dL Final  . GFR, Est African American 12/13/2014 83   Final  . GFR, Est Non African American 12/13/2014 72   Final   Comment:   The estimated GFR is a calculation valid for adults (>=32 years old) that uses the CKD-EPI algorithm to adjust for age and sex. It is   not to be used for children, pregnant women, hospitalized patients,    patients on dialysis, or with rapidly changing kidney function. According to the NKDEP, eGFR >89 is normal, 60-89 shows mild impairment, 30-59 shows moderate impairment, 15-29 shows severe impairment and <15 is ESRD.      Past Medical History  Diagnosis Date  . Hypertension     for approximately 5 years  . Hyperlipidemia     borderline; status post stress test x2, the last one 5-6 years ago and without significant abnormality per the patient  . Coronary artery disease 2011     2 stents placed  . Myocardial infarction 2011  . H/O hiatal hernia    Past Surgical History  Procedure Laterality Date  . Cholecystectomy    . Colonoscopy    . Coronary angioplasty      DES to mid and distal RCA 05/15/10  . Rotator cuff repair Left 10/20/2013    Dr Marlou Sa  . Shoulder arthroscopy with open rotator cuff repair Left 10/20/2013    Procedure: SHOULDER ARTHROSCOPY WITH OPEN ROTATOR CUFF REPAIR;  Surgeon: Meredith Pel, MD;  Location: [facility];  Service: Orthopedics;  Laterality: Left;  Left shoulder arthroscopy, debridement, open biceps tenodesis and rotator cuff repair.   Current Outpatient Prescriptions on File Prior to Visit  Medication Sig Dispense Refill  . aspirin EC 81 MG tablet Take 81 mg by mouth daily.    Marland Kitchen BENICAR 20 MG tablet TAKE 1 TABLET BY MOUTH EVERY DAY 90 tablet 1  . Cholecalciferol (VITAMIN D) 2000 UNITS tablet Take 2,000 Units by mouth 2 (two) times daily.    . clopidogrel (PLAVIX) 75 MG tablet Take 1 tablet (75 mg total) by mouth daily. 90 tablet 3  . fexofenadine (ALLEGRA) 180 MG tablet Take 180 mg by mouth daily.    . metoprolol tartrate (LOPRESSOR) 25 MG tablet Take 0.5 tablets (12.5 mg total) by mouth 2 (two) times daily. 90 tablet 3  . Multiple Vitamin (MULTIVITAMIN WITH MINERALS) TABS tablet Take 1 tablet by mouth daily.    . nitroGLYCERIN (NITROSTAT) 0.4 MG SL tablet Place 1 tablet (0.4 mg total) under the tongue every 5 (five) minutes as needed. 90 tablet 3  . Omega-3 Fatty Acids (FISH OIL TRIPLE STRENGTH) 1400 MG CAPS Take 1,400 mg by mouth daily.    . ranitidine (ZANTAC) 75 MG tablet Take 75 mg by mouth at bedtime.    . rosuvastatin (CRESTOR) 10 MG tablet Take 1 tablet (10 mg total) by mouth daily. 90 tablet 3   No current facility-administered medications on file prior to visit.   No Known Allergies History   Social History  . Marital Status: Married    Spouse Name: N/A    Number of Children: N/A  . Years of Education: N/A   Occupational History  . Not on file.   Social History Main  Topics  . Smoking status: Former Smoker    Types: Cigarettes    Quit date: 12/03/1968  . Smokeless tobacco: Never Used  . Alcohol Use: No  . Drug Use: No  . Sexual Activity: Not on file   Other Topics Concern  . Not on file   Social History Narrative   Quit tobacco greater than 20 years ago with approximately 10 pack-year history. Denies alcohol or drug abuse   Family  History  Problem Relation Age of Onset  . Heart disease Father   . Heart attack    . Sudden death Paternal Uncle     possible MI     Review of Systems  All other systems reviewed and are negative.      Objective:   Physical Exam  Constitutional: He is oriented to person, place, and time. He appears well-developed and well-nourished. No distress.  HENT:  Head: Normocephalic and atraumatic.  Right Ear: External ear normal.  Left Ear: External ear normal.  Nose: Nose normal.  Mouth/Throat: Oropharynx is clear and moist. No oropharyngeal exudate.  Eyes: Conjunctivae and EOM are normal. Pupils are equal, round, and reactive to light. Right eye exhibits no discharge. Left eye exhibits no discharge. No scleral icterus.  Neck: Normal range of motion. Neck supple. No JVD present. No tracheal deviation present. No thyromegaly present.  Cardiovascular: Normal rate, regular rhythm, normal heart sounds and intact distal pulses.  Exam reveals no gallop and no friction rub.   No murmur heard. Pulmonary/Chest: Effort normal and breath sounds normal. No stridor. No respiratory distress. He has no wheezes. He has no rales. He exhibits no tenderness.  Abdominal: Soft. Bowel sounds are normal. He exhibits no distension and no mass. There is no tenderness. There is no rebound and no guarding.  Genitourinary: Rectum normal, prostate normal and penis normal.  Musculoskeletal: Normal range of motion. He exhibits no edema or tenderness.  Lymphadenopathy:    He has no cervical adenopathy.  Neurological: He is alert and oriented to person, place, and time. He has normal reflexes. He displays normal reflexes. No cranial nerve deficit. He exhibits normal muscle tone. Coordination normal.  Skin: Skin is warm. Rash noted. He is not diaphoretic.  Psychiatric: He has a normal mood and affect. His behavior is normal. Judgment and thought content normal.  Vitals reviewed.         Assessment & Plan:  ASCVD  (arteriosclerotic cardiovascular disease) - Plan: valsartan (DIOVAN) 160 MG tablet, atorvastatin (LIPITOR) 40 MG tablet  Routine general medical examination at a health care facility - Plan: Ambulatory referral to Gastroenterology  I'll schedule the patient to see GI for repeat colonoscopy. Prostate exam is normal. PSA is excellent. Patient received Pneumovax 23 today in clinic. The remainder of his preventative care is up-to-date. I will discontinue Benicar and Crestor and replace them with valsartan 160 mg by mouth daily and Lipitor 40 mg a day respectively.  Recheck fasting lab work in 3 months along with a hemoglobin A1c due to his prediabetes.

## 2014-12-17 NOTE — Addendum Note (Signed)
Addended by: Donne AnonPLUMMER, Tanee Henery M on: 12/17/2014 02:05 PM   Modules accepted: Orders

## 2015-02-04 ENCOUNTER — Ambulatory Visit (INDEPENDENT_AMBULATORY_CARE_PROVIDER_SITE_OTHER): Payer: Medicare Other | Admitting: Family Medicine

## 2015-02-04 ENCOUNTER — Encounter: Payer: Self-pay | Admitting: Family Medicine

## 2015-02-04 DIAGNOSIS — H918X1 Other specified hearing loss, right ear: Secondary | ICD-10-CM

## 2015-02-04 DIAGNOSIS — H6121 Impacted cerumen, right ear: Secondary | ICD-10-CM | POA: Diagnosis not present

## 2015-02-04 NOTE — Progress Notes (Signed)
Subjective:    Patient ID: Paul Rubio, male    DOB: 1951/01/21, 64 y.o.   MRN: [id number]  HPI Patient reports unilateral decreased hearing in his right ear. He states it feels like he has cotton wadded up in his ear. He also reports tinnitus in his right ear. He reports a pressure-like pain in his right ear. On examination today there is a cerumen impaction in his right external auditory canal. He denies any fevers or chills or headaches or vertigo. Past Medical History  Diagnosis Date  . Hypertension     for approximately 5 years  . Hyperlipidemia     borderline; status post stress test x2, the last one 5-6 years ago and without significant abnormality per the patient  . Coronary artery disease 2011     2 stents placed  . Myocardial infarction 2011  . H/O hiatal hernia   . Prediabetes    Past Surgical History  Procedure Laterality Date  . Cholecystectomy    . Colonoscopy    . Coronary angioplasty      DES to mid and distal RCA 05/15/10  . Rotator cuff repair Left 10/20/2013    Dr August Saucer  . Shoulder arthroscopy with open rotator cuff repair Left 10/20/2013    Procedure: SHOULDER ARTHROSCOPY WITH OPEN ROTATOR CUFF REPAIR;  Surgeon: Cammy Copa, MD;  Location: [facility];  Service: Orthopedics;  Laterality: Left;  Left shoulder arthroscopy, debridement, open biceps tenodesis and rotator cuff repair.   Current Outpatient Prescriptions on File Prior to Visit  Medication Sig Dispense Refill  . aspirin EC 81 MG tablet Take 81 mg by mouth daily.    Marland Kitchen atorvastatin (LIPITOR) 40 MG tablet Take 1 tablet (40 mg total) by mouth daily. 30 tablet 11  . BENICAR 20 MG tablet TAKE 1 TABLET BY MOUTH EVERY DAY 90 tablet 1  . Cholecalciferol (VITAMIN D) 2000 UNITS tablet Take 2,000 Units by mouth 2 (two) times daily.    . clopidogrel (PLAVIX) 75 MG tablet Take 1 tablet (75 mg total) by mouth daily. 90 tablet 3  . fexofenadine (ALLEGRA) 180 MG tablet Take 180 mg by mouth daily.    . metoprolol  tartrate (LOPRESSOR) 25 MG tablet Take 0.5 tablets (12.5 mg total) by mouth 2 (two) times daily. 90 tablet 3  . Multiple Vitamin (MULTIVITAMIN WITH MINERALS) TABS tablet Take 1 tablet by mouth daily.    . nitroGLYCERIN (NITROSTAT) 0.4 MG SL tablet Place 1 tablet (0.4 mg total) under the tongue every 5 (five) minutes as needed. 90 tablet 3  . Omega-3 Fatty Acids (FISH OIL TRIPLE STRENGTH) 1400 MG CAPS Take 1,400 mg by mouth daily.    . ranitidine (ZANTAC) 75 MG tablet Take 75 mg by mouth at bedtime.    . rosuvastatin (CRESTOR) 10 MG tablet Take 1 tablet (10 mg total) by mouth daily. 90 tablet 3  . valsartan (DIOVAN) 160 MG tablet Take 1 tablet (160 mg total) by mouth daily. 30 tablet 11  . zoster vaccine live, PF, (ZOSTAVAX) 40981 UNT/0.65ML injection Inject 19,400 Units into the skin once. 1 each 0   No current facility-administered medications on file prior to visit.   No Known Allergies History   Social History  . Marital Status: Married    Spouse Name: N/A  . Number of Children: N/A  . Years of Education: N/A   Occupational History  . Not on file.   Social History Main Topics  . Smoking status: Former Smoker  Types: Cigarettes    Quit date: 12/03/1968  . Smokeless tobacco: Never Used  . Alcohol Use: No  . Drug Use: No  . Sexual Activity: Not on file   Other Topics Concern  . Not on file   Social History Narrative   Quit tobacco greater than 20 years ago with approximately 10 pack-year history. Denies alcohol or drug abuse      Review of Systems  All other systems reviewed and are negative.      Objective:   Physical Exam  Constitutional: He appears well-developed and well-nourished.  HENT:  Right Ear: External ear normal.  Left Ear: External ear normal.  Nose: Nose normal.  Mouth/Throat: Oropharynx is clear and moist. No oropharyngeal exudate.  Eyes: Conjunctivae are normal. Pupils are equal, round, and reactive to light.  Cardiovascular: Normal rate,  regular rhythm and normal heart sounds.   Pulmonary/Chest: Effort normal and breath sounds normal.  Vitals reviewed. Right external auditory canal is occluded with wax. Wax is easily removed with irrigation. Right tympanic membrane appears normal with no pathology.        Assessment & Plan:  Hearing loss due to cerumen impaction, right   Easily removed with irrigation/lavage. Patient tolerated procedure well without complication. Symptoms improved after removal of cerumen impaction.

## 2015-07-05 ENCOUNTER — Telehealth: Payer: Self-pay | Admitting: Cardiovascular Disease

## 2015-07-05 NOTE — Telephone Encounter (Signed)
New Message  Pt was sure that labs were needed for his Oct recall. No orders were in the system. Pt made lab appt for 10/17. If no lab work needed please call back and discuss.

## 2015-07-05 NOTE — Telephone Encounter (Signed)
I spoke with the pt and made him aware at this time he does not need lab work prior to his appointment with Dr Excell Seltzer.  The pt had a full set of labs drawn in January by his PCP.

## 2015-08-10 ENCOUNTER — Other Ambulatory Visit: Payer: Self-pay | Admitting: Cardiovascular Disease

## 2015-09-19 ENCOUNTER — Other Ambulatory Visit: Payer: Self-pay

## 2015-09-26 ENCOUNTER — Ambulatory Visit: Payer: Self-pay | Admitting: Cardiovascular Disease

## 2015-09-28 ENCOUNTER — Encounter: Payer: Self-pay | Admitting: Cardiovascular Disease

## 2015-09-28 ENCOUNTER — Ambulatory Visit (INDEPENDENT_AMBULATORY_CARE_PROVIDER_SITE_OTHER): Payer: Medicare Other | Admitting: Cardiovascular Disease

## 2015-09-28 VITALS — BP 120/82 | HR 71 | Ht 70.0 in | Wt 232.0 lb

## 2015-09-28 DIAGNOSIS — I2510 Atherosclerotic heart disease of native coronary artery without angina pectoris: Secondary | ICD-10-CM | POA: Diagnosis not present

## 2015-09-28 DIAGNOSIS — I10 Essential (primary) hypertension: Secondary | ICD-10-CM

## 2015-09-28 NOTE — Progress Notes (Signed)
Cardiology Office Note Date:  09/28/2015   ID:  Paul Rubio, DOB 1951-03-08, MRN [id number]  PCP:  Leo Grosser, MD  Cardiologist:  Tonny Bollman, MD    No chief complaint on file.    History of Present Illness: Paul Rubio is a 64 y.o. male who presents for followup evaluation.  The patient has coronary artery disease and he had a non-ST elevation infarction in 2011. He was noted to have severe stenoses in the mid and distal RCA, both lesions treated with drug-eluting stent platforms. He had mild nonobstructive disease in the LAD and left circumflex vessels. The patient also had mild segmental LV dysfunction with inferior wall hypokinesis and an ejection fraction of 50%.  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 He is not engaged in regular exercise, but he works long hours. He denies shortness of breath, chest pain or pressure, leg swelling, or heart palpitations. He is compliant with his medications.  Past Medical History  Diagnosis Date  . Hypertension     for approximately 5 years  . Hyperlipidemia     borderline; status post stress test x2, the last one 5-6 years ago and without significant abnormality per the patient  . Coronary artery disease 2011     2 stents placed  . Myocardial infarction (HCC) 2011  . H/O hiatal hernia   . Prediabetes     Past Surgical History  Procedure Laterality Date  . Cholecystectomy    . Colonoscopy    . Coronary angioplasty      DES to mid and distal RCA 05/15/10  . Rotator cuff repair Left 10/20/2013    Dr August Saucer  . Shoulder arthroscopy with open rotator cuff repair Left 10/20/2013    Procedure: SHOULDER ARTHROSCOPY WITH OPEN ROTATOR CUFF REPAIR;  Surgeon: Cammy Copa, MD;  Location: [facility] OR;  Service: Orthopedics;  Laterality: Left;  Left shoulder arthroscopy, debridement, open biceps tenodesis and rotator cuff repair.    Current Outpatient Prescriptions  Medication Sig Dispense Refill  . aspirin EC 81 MG  tablet Take 81 mg by mouth daily.    Marland Kitchen atorvastatin (LIPITOR) 40 MG tablet Take 1 tablet (40 mg total) by mouth daily. 30 tablet 11  . Cholecalciferol (VITAMIN D) 2000 UNITS tablet Take 2,000 Units by mouth 2 (two) times daily.    . clopidogrel (PLAVIX) 75 MG tablet TAKE 1 BY MOUTH DAILY 90 tablet 0  . fexofenadine (ALLEGRA) 180 MG tablet Take 180 mg by mouth daily.    . metoprolol tartrate (LOPRESSOR) 25 MG tablet Take 0.5 tablets (12.5 mg total) by mouth 2 (two) times daily. 90 tablet 0  . Multiple Vitamin (MULTIVITAMIN WITH MINERALS) TABS tablet Take 1 tablet by mouth daily.    . nitroGLYCERIN (NITROSTAT) 0.4 MG SL tablet Place 1 tablet (0.4 mg total) under the tongue every 5 (five) minutes as needed. 90 tablet 3  . Omega-3 Fatty Acids (FISH OIL TRIPLE STRENGTH) 1400 MG CAPS Take 1,400 mg by mouth daily.    . ranitidine (ZANTAC) 75 MG tablet Take 75 mg by mouth at bedtime.    . valsartan (DIOVAN) 160 MG tablet Take 1 tablet (160 mg total) by mouth daily. 30 tablet 11  . zoster vaccine live, PF, (ZOSTAVAX) 78295 UNT/0.65ML injection Inject 19,400 Units into the skin once. 1 each 0   No current facility-administered medications for this visit.    Allergies:   Review of patient's allergies indicates no known allergies.   Social History:  The patient  reports that he quit smoking about 46 years ago. His smoking use included Cigarettes. He has never used smokeless tobacco. He reports that he does not drink alcohol or use illicit drugs.   Family History:  The patient's  family history includes Heart attack in an other family member; Heart disease in his father; Sudden death in his paternal uncle.    ROS:  Please see the history of present illness. All other systems are reviewed and negative.   PHYSICAL EXAM: VS:  BP 120/82 mmHg  Pulse 71  Ht 5\' 10"  (1.778 m)  Wt 232 lb (105.235 kg)  BMI 33.29 kg/m2 , BMI Body mass index is 33.29 kg/(m^2). GEN: Well nourished, well developed, in no acute  distress HEENT: normal Neck: no JVD, no masses. No carotid bruits Cardiac: RRR without murmur or gallop                Respiratory:  clear to auscultation bilaterally, normal work of breathing GI: soft, nontender, nondistended, + BS MS: no deformity or atrophy Ext: no pretibial edema, pedal pulses 2+= bilaterally Skin: warm and dry, no rash Neuro:  Strength and sensation are intact Psych: euthymic mood, full affect  EKG:  EKG is ordered today. The ekg ordered today shows normal sinus rhythm 71 bpm, within normal limits.  Recent Labs: 12/13/2014: ALT 31; BUN 33*; Creat 1.08; Hemoglobin 14.4; Platelets 253; Potassium 5.2; Sodium 141; TSH 0.977   Lipid Panel     Component Value Date/Time   CHOL 123 12/13/2014 0957   TRIG 75 12/13/2014 0957   HDL 40 12/13/2014 0957   CHOLHDL 3.1 12/13/2014 0957   VLDL 15 12/13/2014 0957   LDLCALC 68 12/13/2014 0957      Wt Readings from Last 3 Encounters:  09/28/15 232 lb (105.235 kg)  02/04/15 235 lb (106.595 kg)  12/17/14 231 lb (104.781 kg)    ASSESSMENT AND PLAN: 1.  CAD, native vessel: The patient is stable without symptoms of angina. He will continue on his current medical program which was reviewed today. We have elected to leave him on dual antiplatelet therapy with a bleeding risk.  2. Essential hypertension: Blood pressure is well controlled on a combination of metoprolol and valsartan.  3. Hyperlipidemia: The patient is treated with atorvastatin 40 mg. Lipids reviewed and LDL at goal at 68 mg/dL.  Current medicines are reviewed with the patient today.  The patient does not have concerns regarding medicines.  Labs/ tests ordered today include:  No orders of the defined types were placed in this encounter.    Disposition:   FU one year  Signed, Tonny Bollmanooper, Jahmeer Porche, MD  09/28/2015 12:10 PM    [facility] [address], [place], [place]  [address] Phone: [phone]) [phone]; Fax: [phone]

## 2015-09-28 NOTE — Patient Instructions (Signed)

## 2015-09-30 ENCOUNTER — Encounter: Payer: Self-pay | Admitting: Cardiovascular Disease

## 2015-11-21 ENCOUNTER — Other Ambulatory Visit: Payer: Self-pay | Admitting: *Deleted

## 2015-11-21 MED ORDER — CLOPIDOGREL BISULFATE 75 MG PO TABS: 75.0000 mg | ORAL_TABLET | Freq: Every day | ORAL | Status: DC

## 2015-11-21 MED ORDER — METOPROLOL TARTRATE 25 MG PO TABS: 12.5000 mg | ORAL_TABLET | Freq: Two times a day (BID) | ORAL | Status: DC

## 2015-12-20 ENCOUNTER — Other Ambulatory Visit: Payer: Self-pay | Admitting: Family Medicine

## 2015-12-20 ENCOUNTER — Encounter: Payer: Medicare Other | Admitting: Family Medicine

## 2015-12-20 ENCOUNTER — Other Ambulatory Visit: Payer: Medicare Other

## 2015-12-20 DIAGNOSIS — Z79899 Other long term (current) drug therapy: Secondary | ICD-10-CM

## 2015-12-20 DIAGNOSIS — Z0000 Encounter for general adult medical examination without abnormal findings: Secondary | ICD-10-CM

## 2015-12-20 DIAGNOSIS — Z125 Encounter for screening for malignant neoplasm of prostate: Secondary | ICD-10-CM

## 2015-12-20 DIAGNOSIS — I10 Essential (primary) hypertension: Secondary | ICD-10-CM

## 2015-12-20 DIAGNOSIS — E785 Hyperlipidemia, unspecified: Secondary | ICD-10-CM

## 2015-12-20 DIAGNOSIS — R7303 Prediabetes: Secondary | ICD-10-CM

## 2015-12-20 LAB — CBC WITH DIFFERENTIAL/PLATELET
Basophils Absolute: 0 10*3/uL (ref 0.0–0.1)
Basophils Relative: 0 % (ref 0–1)
Eosinophils Absolute: 0.1 10*3/uL (ref 0.0–0.7)
Eosinophils Relative: 2 % (ref 0–5)
HCT: 43 % (ref 39.0–52.0)
Hemoglobin: 14.9 g/dL (ref 13.0–17.0)
Lymphocytes Relative: 29 % (ref 12–46)
Lymphs Abs: 1.9 10*3/uL (ref 0.7–4.0)
MCH: 30.6 pg (ref 26.0–34.0)
MCHC: 34.7 g/dL (ref 30.0–36.0)
MCV: 88.3 fL (ref 78.0–100.0)
MPV: 10.3 fL (ref 8.6–12.4)
Monocytes Absolute: 0.8 10*3/uL (ref 0.1–1.0)
Monocytes Relative: 12 % (ref 3–12)
Neutro Abs: 3.8 10*3/uL (ref 1.7–7.7)
Neutrophils Relative %: 57 % (ref 43–77)
Platelets: 227 10*3/uL (ref 150–400)
RBC: 4.87 MIL/uL (ref 4.22–5.81)
RDW: 13.3 % (ref 11.5–15.5)
WBC: 6.7 10*3/uL (ref 4.0–10.5)

## 2015-12-20 LAB — LIPID PANEL
Cholesterol: 99 mg/dL — ABNORMAL LOW (ref 125–200)
HDL: 39 mg/dL — ABNORMAL LOW (ref >=40)
LDL Cholesterol: 47 mg/dL (ref <130)
Total CHOL/HDL Ratio: 2.5 Ratio (ref <=5.0)
Triglycerides: 66 mg/dL (ref <150)
VLDL: 13 mg/dL (ref <30)

## 2015-12-20 LAB — TSH: TSH: 0.981 u[IU]/mL (ref 0.350–4.500)

## 2015-12-20 LAB — COMPLETE METABOLIC PANEL WITH GFR
ALT: 45 U/L (ref 9–46)
AST: 33 U/L (ref 10–35)
Albumin: 3.9 g/dL (ref 3.6–5.1)
Alkaline Phosphatase: 65 U/L (ref 40–115)
BUN: 20 mg/dL (ref 7–25)
CO2: 24 mmol/L (ref 20–31)
Calcium: 9 mg/dL (ref 8.6–10.3)
Chloride: 103 mmol/L (ref 98–110)
Creat: 1.02 mg/dL (ref 0.70–1.25)
GFR, Est African American: 88 mL/min (ref >=60)
GFR, Est Non African American: 76 mL/min (ref >=60)
Glucose, Bld: 107 mg/dL — ABNORMAL HIGH (ref 70–99)
Potassium: 4.5 mmol/L (ref 3.5–5.3)
Sodium: 141 mmol/L (ref 135–146)
Total Bilirubin: 0.6 mg/dL (ref 0.2–1.2)
Total Protein: 6.9 g/dL (ref 6.1–8.1)

## 2015-12-20 NOTE — Telephone Encounter (Signed)
Refill appropriate and filled per protocol. 

## 2015-12-21 LAB — HEMOGLOBIN A1C
Hgb A1c MFr Bld: 6.7 % — ABNORMAL HIGH (ref <5.7)
Mean Plasma Glucose: 146 mg/dL — ABNORMAL HIGH (ref <117)

## 2015-12-21 LAB — PSA, MEDICARE: PSA: 1.85 ng/mL (ref <=4.00)

## 2015-12-27 ENCOUNTER — Ambulatory Visit (INDEPENDENT_AMBULATORY_CARE_PROVIDER_SITE_OTHER): Payer: Medicare Other | Admitting: Family Medicine

## 2015-12-27 ENCOUNTER — Encounter: Payer: Self-pay | Admitting: Family Medicine

## 2015-12-27 ENCOUNTER — Other Ambulatory Visit: Payer: Self-pay | Admitting: Family Medicine

## 2015-12-27 VITALS — BP 110/68 | HR 78 | Temp 98.7°F | Resp 16 | Ht 68.0 in | Wt 230.0 lb

## 2015-12-27 DIAGNOSIS — Z0000 Encounter for general adult medical examination without abnormal findings: Secondary | ICD-10-CM | POA: Diagnosis not present

## 2015-12-27 DIAGNOSIS — Z23 Encounter for immunization: Secondary | ICD-10-CM | POA: Diagnosis not present

## 2015-12-27 DIAGNOSIS — E119 Type 2 diabetes mellitus without complications: Secondary | ICD-10-CM

## 2015-12-27 DIAGNOSIS — N3281 Overactive bladder: Secondary | ICD-10-CM | POA: Diagnosis not present

## 2015-12-27 DIAGNOSIS — I2510 Atherosclerotic heart disease of native coronary artery without angina pectoris: Secondary | ICD-10-CM | POA: Diagnosis not present

## 2015-12-27 MED ORDER — SOLIFENACIN SUCCINATE 10 MG PO TABS: 10.0000 mg | ORAL_TABLET | Freq: Every day | ORAL | Status: DC

## 2015-12-27 NOTE — Addendum Note (Signed)
Addended by: Legrand Rams B on: 12/27/2015 04:20 PM   Modules accepted: Orders

## 2015-12-27 NOTE — Progress Notes (Signed)
Subjective:    Patient ID: Paul Rubio, male    DOB: 03/21/1951, 65 y.o.   MRN: [id number]  HPI  Patient is here today for complete physical exam. He is due today for a flu shot. He is due today for Prevnar 13. He is overdue for colonoscopy. Pneumovax 23 is up-to-date. His tetanus shot is up-to-date. He would like me to schedule him for colonoscopy. We discussed hepatitis C screening and he declines this today along with HIV screening. Most recent lab work as listed below and is significant for worsening of his prediabetes. His hemoglobin A1c is now 6.7 and qualifies as type II non-insulin-dependent diabetes mellitus.  Appointment on 12/20/2015  Component Date Value Ref Range Status  . Sodium 12/20/2015 141  135 - 146 mmol/L Final  . Potassium 12/20/2015 4.5  3.5 - 5.3 mmol/L Final  . Chloride 12/20/2015 103  98 - 110 mmol/L Final  . CO2 12/20/2015 24  20 - 31 mmol/L Final  . Glucose, Bld 12/20/2015 107* 70 - 99 mg/dL Final  . BUN 12/20/2015 20  7 - 25 mg/dL Final  . Creat 12/20/2015 1.02  0.70 - 1.25 mg/dL Final  . Total Bilirubin 12/20/2015 0.6  0.2 - 1.2 mg/dL Final  . Alkaline Phosphatase 12/20/2015 65  40 - 115 U/L Final  . AST 12/20/2015 33  10 - 35 U/L Final  . ALT 12/20/2015 45  9 - 46 U/L Final  . Total Protein 12/20/2015 6.9  6.1 - 8.1 g/dL Final  . Albumin 12/20/2015 3.9  3.6 - 5.1 g/dL Final  . Calcium 12/20/2015 9.0  8.6 - 10.3 mg/dL Final  . GFR, Est African American 12/20/2015 88  >=60 mL/min Final  . GFR, Est Non African American 12/20/2015 76  >=60 mL/min Final   Comment:   The estimated GFR is a calculation valid for adults (>=7 years old) that uses the CKD-EPI algorithm to adjust for age and sex. It is   not to be used for children, pregnant women, hospitalized patients,    patients on dialysis, or with rapidly changing kidney function. According to the NKDEP, eGFR >89 is normal, 60-89 shows mild impairment, 30-59 shows moderate impairment, 15-29 shows  severe impairment and <15 is ESRD.     . TSH 12/20/2015 0.981  0.350 - 4.500 uIU/mL Final  . Cholesterol 12/20/2015 99* 125 - 200 mg/dL Final  . Triglycerides 12/20/2015 66  <150 mg/dL Final  . HDL 12/20/2015 39* >=40 mg/dL Final  . Total CHOL/HDL Ratio 12/20/2015 2.5  <=5.0 Ratio Final  . VLDL 12/20/2015 13  <30 mg/dL Final  . LDL Cholesterol 12/20/2015 47  <130 mg/dL Final   Comment:   Total Cholesterol/HDL Ratio:CHD Risk                        Coronary Heart Disease Risk Table                                        Men       Women          1/2 Average Risk              3.4        3.3              Average Risk  5.0        4.4           2X Average Risk              9.6        7.1           3X Average Risk             23.4       11.0 Use the calculated Patient Ratio above and the CHD Risk table  to determine the patient's CHD Risk.   . WBC 12/20/2015 6.7  4.0 - 10.5 K/uL Final  . RBC 12/20/2015 4.87  4.22 - 5.81 MIL/uL Final  . Hemoglobin 12/20/2015 14.9  13.0 - 17.0 g/dL Final  . HCT 12/20/2015 43.0  39.0 - 52.0 % Final  . MCV 12/20/2015 88.3  78.0 - 100.0 fL Final  . MCH 12/20/2015 30.6  26.0 - 34.0 pg Final  . MCHC 12/20/2015 34.7  30.0 - 36.0 g/dL Final  . RDW 12/20/2015 13.3  11.5 - 15.5 % Final  . Platelets 12/20/2015 227  150 - 400 K/uL Final  . MPV 12/20/2015 10.3  8.6 - 12.4 fL Final  . Neutrophils Relative % 12/20/2015 57  43 - 77 % Final  . Neutro Abs 12/20/2015 3.8  1.7 - 7.7 K/uL Final  . Lymphocytes Relative 12/20/2015 29  12 - 46 % Final  . Lymphs Abs 12/20/2015 1.9  0.7 - 4.0 K/uL Final  . Monocytes Relative 12/20/2015 12  3 - 12 % Final  . Monocytes Absolute 12/20/2015 0.8  0.1 - 1.0 K/uL Final  . Eosinophils Relative 12/20/2015 2  0 - 5 % Final  . Eosinophils Absolute 12/20/2015 0.1  0.0 - 0.7 K/uL Final  . Basophils Relative 12/20/2015 0  0 - 1 % Final  . Basophils Absolute 12/20/2015 0.0  0.0 - 0.1 K/uL Final  . Smear Review 12/20/2015  Criteria for review not met   Final  . Hgb A1c MFr Bld 12/20/2015 6.7* <5.7 % Final   Comment:                                                                        According to the ADA Clinical Practice Recommendations for 2011, when HbA1c is used as a screening test:     >=6.5%   Diagnostic of Diabetes Mellitus            (if abnormal result is confirmed)   5.7-6.4%   Increased risk of developing Diabetes Mellitus   References:Diagnosis and Classification of Diabetes Mellitus,Diabetes ATFT,7322,02(RKYHC 1):S62-S69 and Standards of Medical Care in         Diabetes - 2011,Diabetes WCBJ,6283,15 (Suppl 1):S11-S61.     . Mean Plasma Glucose 12/20/2015 146* <117 mg/dL Final  . PSA 12/20/2015 1.85  <=4.00 ng/mL Final   Comment: Test Methodology: ECLIA PSA (Electrochemiluminescence Immunoassay)   For PSA values from 2.5-4.0, particularly in younger men <59 years old, the AUA and NCCN suggest testing for % Free PSA (3515) and evaluation of the rate of increase in PSA (PSA velocity).    Past Medical History  Diagnosis Date  . Hypertension     for approximately 5 years  .  Hyperlipidemia     borderline; status post stress test x2, the last one 5-6 years ago and without significant abnormality per the patient  . Coronary artery disease 2011     2 stents placed  . Myocardial infarction ([place]) 2011  . H/O hiatal hernia   . Prediabetes    Past Surgical History  Procedure Laterality Date  . Cholecystectomy    . Colonoscopy    . Coronary angioplasty      DES to mid and distal RCA 05/15/10  . Rotator cuff repair Left 10/20/2013    Dr Marlou Sa  . Shoulder arthroscopy with open rotator cuff repair Left 10/20/2013    Procedure: SHOULDER ARTHROSCOPY WITH OPEN ROTATOR CUFF REPAIR;  Surgeon: Meredith Pel, MD;  Location: [facility];  Service: Orthopedics;  Laterality: Left;  Left shoulder arthroscopy, debridement, open biceps tenodesis and rotator cuff repair.   Current Outpatient Prescriptions  on File Prior to Visit  Medication Sig Dispense Refill  . aspirin EC 81 MG tablet Take 81 mg by mouth daily.    . Cholecalciferol (VITAMIN D) 2000 UNITS tablet Take 2,000 Units by mouth 2 (two) times daily.    . clopidogrel (PLAVIX) 75 MG tablet Take 1 tablet (75 mg total) by mouth daily. 90 tablet 2  . fexofenadine (ALLEGRA) 180 MG tablet Take 180 mg by mouth daily.    . metoprolol tartrate (LOPRESSOR) 25 MG tablet Take 0.5 tablets (12.5 mg total) by mouth 2 (two) times daily. 90 tablet 2  . Multiple Vitamin (MULTIVITAMIN WITH MINERALS) TABS tablet Take 1 tablet by mouth daily.    . nitroGLYCERIN (NITROSTAT) 0.4 MG SL tablet Place 1 tablet (0.4 mg total) under the tongue every 5 (five) minutes as needed. 90 tablet 3  . Omega-3 Fatty Acids (FISH OIL TRIPLE STRENGTH) 1400 MG CAPS Take 1,400 mg by mouth daily.    . ranitidine (ZANTAC) 75 MG tablet Take 75 mg by mouth at bedtime.    . valsartan (DIOVAN) 160 MG tablet TAKE 1 TABLET BY MOUTH EVERY DAY 30 tablet 3   No current facility-administered medications on file prior to visit.   No Known Allergies Social History   Social History  . Marital Status: Married    Spouse Name: N/A  . Number of Children: N/A  . Years of Education: N/A   Occupational History  . Not on file.   Social History Main Topics  . Smoking status: Former Smoker    Types: Cigarettes    Quit date: 12/03/1968  . Smokeless tobacco: Never Used  . Alcohol Use: No  . Drug Use: No  . Sexual Activity: Not on file   Other Topics Concern  . Not on file   Social History Narrative   Quit tobacco greater than 20 years ago with approximately 10 pack-year history. Denies alcohol or drug abuse   Family History  Problem Relation Age of Onset  . Heart disease Father   . Heart attack    . Sudden death Paternal Uncle     possible MI     Review of Systems  All other systems reviewed and are negative.      Objective:   Physical Exam  Constitutional: He is oriented  to person, place, and time. He appears well-developed and well-nourished. No distress.  HENT:  Head: Normocephalic and atraumatic.  Right Ear: External ear normal.  Left Ear: External ear normal.  Nose: Nose normal.  Mouth/Throat: Oropharynx is clear and moist. No oropharyngeal exudate.  Eyes: Conjunctivae  and EOM are normal. Pupils are equal, round, and reactive to light. Right eye exhibits no discharge. Left eye exhibits no discharge. No scleral icterus.  Neck: Normal range of motion. Neck supple. No JVD present. No tracheal deviation present. No thyromegaly present.  Cardiovascular: Normal rate, regular rhythm, normal heart sounds and intact distal pulses.  Exam reveals no gallop and no friction rub.   No murmur heard. Pulmonary/Chest: Effort normal and breath sounds normal. No stridor. No respiratory distress. He has no wheezes. He has no rales. He exhibits no tenderness.  Abdominal: Soft. Bowel sounds are normal. He exhibits no distension and no mass. There is no tenderness. There is no rebound and no guarding.  Genitourinary: Rectum normal, prostate normal and penis normal.  Musculoskeletal: Normal range of motion. He exhibits no edema or tenderness.  Lymphadenopathy:    He has no cervical adenopathy.  Neurological: He is alert and oriented to person, place, and time. He has normal reflexes. No cranial nerve deficit. He exhibits normal muscle tone. Coordination normal.  Skin: Skin is warm. Rash noted. He is not diaphoretic.  Psychiatric: He has a normal mood and affect. His behavior is normal. Judgment and thought content normal.  Vitals reviewed.         Assessment & Plan:  OAB (overactive bladder) - Plan: solifenacin (VESICARE) 10 MG tablet  ASCVD (arteriosclerotic cardiovascular disease)  Routine general medical examination at a health care facility  Controlled type 2 diabetes mellitus without complication, without long-term current use of insulin ([place])  I'll schedule the  patient to see GI for repeat colonoscopy. Prostate exam is normal. PSA is excellent. Patient received Pneumovax 13 today.  Pressures acceptable. Cholesterol is excellent. We did discuss a low carbohydrate diet to manage his type 2 diabetes mellitus. I will recheck this in 6 months. If continuing to worsen, I'll start the patient on metformin. I recommended a diabetic eye exam. Patient does report urinary frequency. He reports urinary urgency. He reports good stream but low urine volume. Frequently urgency. Him to urinate and he can't make it to the restroom in time. Symptoms are consistent with overactive bladder and therefore I started the patient on Vesicare 10 mg by mouth daily to see if he received symptomatic improvement. Prostate is normal in size and therefore I do not believe his symptoms are related to BPH.

## 2016-04-17 ENCOUNTER — Other Ambulatory Visit: Payer: Self-pay | Admitting: Family Medicine

## 2016-04-17 NOTE — Telephone Encounter (Signed)
Medication refilled per protocol. 

## 2016-04-17 NOTE — Telephone Encounter (Signed)
Refill appropriate and filled per protocol. 

## 2016-06-18 ENCOUNTER — Other Ambulatory Visit: Payer: Self-pay | Admitting: Family Medicine

## 2016-06-18 NOTE — Telephone Encounter (Signed)
Refill appropriate and filled per protocol. 

## 2016-07-16 ENCOUNTER — Other Ambulatory Visit: Payer: Self-pay | Admitting: Family Medicine

## 2016-07-16 DIAGNOSIS — I2510 Atherosclerotic heart disease of native coronary artery without angina pectoris: Secondary | ICD-10-CM

## 2016-07-16 NOTE — Telephone Encounter (Signed)
Refill appropriate and filled per protocol. 

## 2016-08-20 ENCOUNTER — Other Ambulatory Visit: Payer: Self-pay | Admitting: Cardiovascular Disease

## 2016-08-20 ENCOUNTER — Other Ambulatory Visit: Payer: Self-pay | Admitting: Family Medicine

## 2016-08-20 DIAGNOSIS — I2510 Atherosclerotic heart disease of native coronary artery without angina pectoris: Secondary | ICD-10-CM

## 2016-09-24 ENCOUNTER — Ambulatory Visit (INDEPENDENT_AMBULATORY_CARE_PROVIDER_SITE_OTHER): Payer: Medicare Other | Admitting: Cardiovascular Disease

## 2016-09-24 ENCOUNTER — Other Ambulatory Visit: Payer: Self-pay | Admitting: Cardiovascular Disease

## 2016-09-24 ENCOUNTER — Encounter: Payer: Self-pay | Admitting: Cardiovascular Disease

## 2016-09-24 VITALS — BP 130/70 | HR 72 | Ht 70.0 in | Wt 234.8 lb

## 2016-09-24 DIAGNOSIS — I10 Essential (primary) hypertension: Secondary | ICD-10-CM | POA: Diagnosis not present

## 2016-09-24 DIAGNOSIS — I2510 Atherosclerotic heart disease of native coronary artery without angina pectoris: Secondary | ICD-10-CM

## 2016-09-24 MED ORDER — VALSARTAN 160 MG PO TABS: 160.0000 mg | ORAL_TABLET | Freq: Every day | ORAL | 3 refills | Status: DC

## 2016-09-24 MED ORDER — CLOPIDOGREL BISULFATE 75 MG PO TABS: ORAL_TABLET | ORAL | 3 refills | Status: DC

## 2016-09-24 MED ORDER — METOPROLOL TARTRATE 25 MG PO TABS: 12.5000 mg | ORAL_TABLET | Freq: Two times a day (BID) | ORAL | 3 refills | Status: DC

## 2016-09-24 MED ORDER — NITROGLYCERIN 0.4 MG SL SUBL: 0.4000 mg | SUBLINGUAL_TABLET | SUBLINGUAL | 1 refills | Status: DC | PRN

## 2016-09-24 MED ORDER — ATORVASTATIN CALCIUM 40 MG PO TABS: 40.0000 mg | ORAL_TABLET | Freq: Every day | ORAL | 3 refills | Status: DC

## 2016-09-24 NOTE — Progress Notes (Signed)
Cardiology Office Note Date:  09/26/2016   ID:  Paul CollaDonald G Tirado, DOB Jul 24, 1951, MRN [id number]  PCP:  Leo GrosserPICKARD,WARREN TOM, MD  Cardiologist:  Tonny Bollmanooper, Gerene Nedd, MD    Chief Complaint  Patient presents with  . Coronary Artery Disease   History of Present Illness: Paul Rubio is a 65 y.o. male who presents for followup evaluation.  The patient has coronary artery disease and he had a non-ST elevation infarction in 2011. He was noted to have severe stenoses in the mid and distal RCA, both lesions treated with drug-eluting stent platforms. He had mild nonobstructive disease in the LAD and left circumflex vessels. The patient also had mild segmental LV dysfunction with inferior wall hypokinesis and an ejection fraction of 50%.  At the time of his ACS presentation in 2011, he experienced substernal chest pressure, described as feeling like 'someone sitting on my chest.' He's had no recurrence since that time, even with physical exertion. Today, he denies symptoms of palpitations, chest pain, shortness of breath, orthopnea, PND, lower extremity edema, dizziness, or syncope. 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  Past Medical History:  Diagnosis Date  . Coronary artery disease 2011    2 stents placed  . H/O hiatal hernia   . Hyperlipidemia    borderline; status post stress test x2, the last one 5-6 years ago and without significant abnormality per the patient  . Hypertension    for approximately 5 years  . Myocardial infarction 2011  . Prediabetes     Past Surgical History:  Procedure Laterality Date  . CHOLECYSTECTOMY    . COLONOSCOPY    . CORONARY ANGIOPLASTY     DES to mid and distal RCA 05/15/10  . ROTATOR CUFF REPAIR Left 10/20/2013   Dr August Saucerean  . SHOULDER ARTHROSCOPY WITH OPEN ROTATOR CUFF REPAIR Left 10/20/2013   Procedure: SHOULDER ARTHROSCOPY WITH OPEN ROTATOR CUFF REPAIR;  Surgeon: Cammy CopaGregory Scott Dean, MD;  [facility];   Service: Orthopedics;  Laterality: Left;  Left shoulder arthroscopy, debridement, open biceps tenodesis and rotator cuff repair.    Current Outpatient Prescriptions  Medication Sig Dispense Refill  . aspirin EC 81 MG tablet Take 81 mg by mouth daily.    Marland Kitchen. atorvastatin (LIPITOR) 40 MG tablet Take 1 tablet (40 mg total) by mouth daily. 90 tablet 3  . Cholecalciferol (VITAMIN D) 2000 UNITS tablet Take 2,000 Units by mouth 2 (two) times daily.    . clopidogrel (PLAVIX) 75 MG tablet TAKE 1 TABLET(75 MG) BY MOUTH DAILY 90 tablet 3  . fexofenadine (ALLEGRA) 180 MG tablet Take 180 mg by mouth daily.    . metoprolol tartrate (LOPRESSOR) 25 MG tablet Take 0.5 tablets (12.5 mg total) by mouth 2 (two) times daily. 90 tablet 3  . Multiple Vitamin (MULTIVITAMIN WITH MINERALS) TABS tablet Take 1 tablet by mouth daily.    . Omega-3 Fatty Acids (FISH OIL TRIPLE STRENGTH) 1400 MG CAPS Take 1,400 mg by mouth daily.    . ranitidine (ZANTAC) 75 MG tablet Take 75 mg by mouth at bedtime.    . solifenacin (VESICARE) 10 MG tablet Take 1 tablet (10 mg total) by mouth daily. 30 tablet 5  . valsartan (DIOVAN) 160 MG tablet Take 1 tablet (160 mg total) by mouth daily. 90 tablet 3  . nitroGLYCERIN (NITROSTAT) 0.4 MG SL tablet Place 1 tablet (0.4 mg total) under the tongue every 5 (five) minutes as needed for chest pain.  25 tablet 4   No current facility-administered medications for this visit.     Allergies:   Review of patient's allergies indicates no known allergies.   Social History:  The patient  reports that he quit smoking about 47 years ago. His smoking use included Cigarettes. He has never used smokeless tobacco. He reports that he does not drink alcohol or use drugs.   Family History:  The patient's  family history includes Heart disease in his father; Sudden death in his paternal uncle.   ROS:  Please see the history of present illness. All other systems are reviewed and negative.   PHYSICAL EXAM: VS:  BP  130/70   Pulse 72   Ht 5\' 10"  (1.778 m)   Wt 106.5 kg (234 lb 12.8 oz)   BMI 33.69 kg/m  , BMI Body mass index is 33.69 kg/m. GEN: Well nourished, well developed, in no acute distress  HEENT: normal  Neck: no JVD, no masses. No carotid bruits Cardiac: RRR without murmur or gallop                Respiratory:  clear to auscultation bilaterally, normal work of breathing GI: soft, nontender, nondistended, + BS MS: no deformity or atrophy  Ext: no pretibial edema, pedal pulses 2+= bilaterally Skin: warm and dry, no rash Neuro:  Strength and sensation are intact Psych: euthymic mood, full affect  EKG:  EKG is ordered today. The ekg ordered today shows Normal sinus rhythm 67 bpm, with occasional PVC, otherwise normal  Recent Labs: 12/20/2015: ALT 45; BUN 20; Creat 1.02; Hemoglobin 14.9; Platelets 227; Potassium 4.5; Sodium 141; TSH 0.981   Lipid Panel     Component Value Date/Time   CHOL 99 (L) 12/20/2015 1139   TRIG 66 12/20/2015 1139   HDL 39 (L) 12/20/2015 1139   CHOLHDL 2.5 12/20/2015 1139   VLDL 13 12/20/2015 1139   LDLCALC 47 12/20/2015 1139   Wt Readings from Last 3 Encounters:  09/24/16 106.5 kg (234 lb 12.8 oz)  12/27/15 104.3 kg (230 lb)  09/28/15 105.2 kg (232 lb)    ASSESSMENT AND PLAN: 1.  CAD, native vessel: no angina at present. Medications reviewed and are appropriate. He has been maintained on long-term DAPT, metoprolol, and atorvastatin, tolerating all medications well.  2. Hyperlipidemia: continue atorvastatin. Most recent lipids reviewed as above (LDL 47 mg/dL).  3. HTN: well-controlled. Continue valsartan and metoprolol.  Current medicines are reviewed with the patient today.  The patient does not have concerns regarding medicines.  Labs/ tests ordered today include:   Orders Placed This Encounter  Procedures  . EKG 12-Lead   Disposition:   FU one year  Signed, Tonny Bollman, MD  09/26/2016 4:06 PM    [facility] [address] [place], [place], [place]  [address] Phone: [phone]; Fax: [phone]

## 2016-09-24 NOTE — Patient Instructions (Signed)

## 2016-09-27 ENCOUNTER — Ambulatory Visit (INDEPENDENT_AMBULATORY_CARE_PROVIDER_SITE_OTHER): Payer: Medicare Other | Admitting: *Deleted

## 2016-09-27 ENCOUNTER — Ambulatory Visit: Payer: Medicare Other

## 2016-09-27 DIAGNOSIS — Z23 Encounter for immunization: Secondary | ICD-10-CM

## 2016-09-27 NOTE — Progress Notes (Signed)
Patient seen in office for Influenza Vaccination.   Tolerated IM administration well.   Immunization history updated.  

## 2016-10-22 DIAGNOSIS — H52223 Regular astigmatism, bilateral: Secondary | ICD-10-CM | POA: Diagnosis not present

## 2016-10-22 DIAGNOSIS — H2513 Age-related nuclear cataract, bilateral: Secondary | ICD-10-CM | POA: Diagnosis not present

## 2016-10-22 DIAGNOSIS — H524 Presbyopia: Secondary | ICD-10-CM | POA: Diagnosis not present

## 2016-10-22 DIAGNOSIS — H5203 Hypermetropia, bilateral: Secondary | ICD-10-CM | POA: Diagnosis not present

## 2016-11-30 DIAGNOSIS — D1801 Hemangioma of skin and subcutaneous tissue: Secondary | ICD-10-CM | POA: Diagnosis not present

## 2016-11-30 DIAGNOSIS — L570 Actinic keratosis: Secondary | ICD-10-CM | POA: Diagnosis not present

## 2016-11-30 DIAGNOSIS — D2361 Other benign neoplasm of skin of right upper limb, including shoulder: Secondary | ICD-10-CM | POA: Diagnosis not present

## 2016-11-30 DIAGNOSIS — C44519 Basal cell carcinoma of skin of other part of trunk: Secondary | ICD-10-CM | POA: Diagnosis not present

## 2016-11-30 DIAGNOSIS — D485 Neoplasm of uncertain behavior of skin: Secondary | ICD-10-CM | POA: Diagnosis not present

## 2016-11-30 DIAGNOSIS — D2371 Other benign neoplasm of skin of right lower limb, including hip: Secondary | ICD-10-CM | POA: Diagnosis not present

## 2016-12-28 ENCOUNTER — Other Ambulatory Visit: Payer: Self-pay | Admitting: Family Medicine

## 2016-12-28 ENCOUNTER — Other Ambulatory Visit: Payer: Medicare Other

## 2016-12-28 DIAGNOSIS — Z0000 Encounter for general adult medical examination without abnormal findings: Secondary | ICD-10-CM | POA: Diagnosis not present

## 2016-12-28 DIAGNOSIS — Z125 Encounter for screening for malignant neoplasm of prostate: Secondary | ICD-10-CM | POA: Diagnosis not present

## 2016-12-28 DIAGNOSIS — E785 Hyperlipidemia, unspecified: Secondary | ICD-10-CM

## 2016-12-28 DIAGNOSIS — R7303 Prediabetes: Secondary | ICD-10-CM

## 2016-12-28 DIAGNOSIS — Z79899 Other long term (current) drug therapy: Secondary | ICD-10-CM

## 2016-12-28 DIAGNOSIS — I10 Essential (primary) hypertension: Secondary | ICD-10-CM

## 2016-12-28 LAB — CBC WITH DIFFERENTIAL/PLATELET
Basophils Absolute: 0 cells/uL (ref 0–200)
Basophils Relative: 0 %
Eosinophils Absolute: 204 cells/uL (ref 15–500)
Eosinophils Relative: 3 %
HCT: 44.1 % (ref 38.5–50.0)
Hemoglobin: 14.9 g/dL (ref 13.0–17.0)
Lymphocytes Relative: 31 %
Lymphs Abs: 2108 cells/uL (ref 850–3900)
MCH: 30.5 pg (ref 27.0–33.0)
MCHC: 33.8 g/dL (ref 32.0–36.0)
MCV: 90.4 fL (ref 80.0–100.0)
MPV: 10.3 fL (ref 7.5–12.5)
Monocytes Absolute: 544 cells/uL (ref 200–950)
Monocytes Relative: 8 %
Neutro Abs: 3944 cells/uL (ref 1500–7800)
Neutrophils Relative %: 58 %
Platelets: 244 10*3/uL (ref 140–400)
RBC: 4.88 MIL/uL (ref 4.20–5.80)
RDW: 13.3 % (ref 11.0–15.0)
WBC: 6.8 10*3/uL (ref 3.8–10.8)

## 2016-12-28 LAB — TSH: TSH: 1.04 mIU/L (ref 0.40–4.50)

## 2016-12-28 LAB — PSA: PSA: 1.1 ng/mL (ref <=4.0)

## 2016-12-29 LAB — COMPLETE METABOLIC PANEL WITH GFR
ALT: 36 U/L (ref 9–46)
AST: 27 U/L (ref 10–35)
Albumin: 3.8 g/dL (ref 3.6–5.1)
Alkaline Phosphatase: 61 U/L (ref 40–115)
BUN: 24 mg/dL (ref 7–25)
CO2: 23 mmol/L (ref 20–31)
Calcium: 9.1 mg/dL (ref 8.6–10.3)
Chloride: 104 mmol/L (ref 98–110)
Creat: 1.01 mg/dL (ref 0.70–1.25)
GFR, Est African American: 89 mL/min (ref >=60)
GFR, Est Non African American: 77 mL/min (ref >=60)
Glucose, Bld: 120 mg/dL — ABNORMAL HIGH (ref 70–99)
Potassium: 4.5 mmol/L (ref 3.5–5.3)
Sodium: 140 mmol/L (ref 135–146)
Total Bilirubin: 0.5 mg/dL (ref 0.2–1.2)
Total Protein: 6.7 g/dL (ref 6.1–8.1)

## 2016-12-29 LAB — LIPID PANEL
Cholesterol: 96 mg/dL (ref <200)
HDL: 43 mg/dL (ref >40)
LDL Cholesterol: 40 mg/dL (ref <100)
Total CHOL/HDL Ratio: 2.2 Ratio (ref <5.0)
Triglycerides: 67 mg/dL (ref <150)
VLDL: 13 mg/dL (ref <30)

## 2016-12-29 LAB — HEMOGLOBIN A1C
Hgb A1c MFr Bld: 6.6 % — ABNORMAL HIGH (ref <5.7)
Mean Plasma Glucose: 143 mg/dL

## 2017-01-01 ENCOUNTER — Encounter: Payer: Self-pay | Admitting: Family Medicine

## 2017-01-01 ENCOUNTER — Ambulatory Visit (INDEPENDENT_AMBULATORY_CARE_PROVIDER_SITE_OTHER): Payer: Medicare Other | Admitting: Family Medicine

## 2017-01-01 VITALS — BP 142/80 | HR 80 | Temp 98.5°F | Resp 16 | Ht 68.0 in | Wt 236.0 lb

## 2017-01-01 DIAGNOSIS — N3281 Overactive bladder: Secondary | ICD-10-CM | POA: Diagnosis not present

## 2017-01-01 DIAGNOSIS — Z0000 Encounter for general adult medical examination without abnormal findings: Secondary | ICD-10-CM

## 2017-01-01 DIAGNOSIS — E119 Type 2 diabetes mellitus without complications: Secondary | ICD-10-CM | POA: Diagnosis not present

## 2017-01-01 DIAGNOSIS — I2510 Atherosclerotic heart disease of native coronary artery without angina pectoris: Secondary | ICD-10-CM

## 2017-01-01 NOTE — Progress Notes (Signed)
Subjective:    Patient ID: Paul Rubio, male    DOB: 05/04/1951, 66 y.o.   MRN: [id number]  HPI Patient is here today for complete physical exam. Immunizations are up-to-date. We did discuss the shingles vaccine and he will check on the price of that. He is overdue for colonoscopy. This was not scheduled last year and this needs to be scheduled. Patient has gained 5 pounds since his last office visit his blood pressure slightly elevated. He continues to have symptoms of overactive bladder including frequency and urgency. He does report strong stream and his PSA is reassuring. Furthermore his rectal exam is normal and therefore I do not feel it has any relationship to BPH. Appointment on 12/28/2016  Component Date Value Ref Range Status  . Sodium 12/28/2016 140  135 - 146 mmol/L Final  . Potassium 12/28/2016 4.5  3.5 - 5.3 mmol/L Final  . Chloride 12/28/2016 104  98 - 110 mmol/L Final  . CO2 12/28/2016 23  20 - 31 mmol/L Final  . Glucose, Bld 12/28/2016 120* 70 - 99 mg/dL Final  . BUN 12/28/2016 24  7 - 25 mg/dL Final  . Creat 12/28/2016 1.01  0.70 - 1.25 mg/dL Final   Comment:   For patients > or = 68 years of age: The upper reference limit for Creatinine is approximately 13% higher for people identified as African-American.     . Total Bilirubin 12/28/2016 0.5  0.2 - 1.2 mg/dL Final  . Alkaline Phosphatase 12/28/2016 61  40 - 115 U/L Final  . AST 12/28/2016 27  10 - 35 U/L Final  . ALT 12/28/2016 36  9 - 46 U/L Final  . Total Protein 12/28/2016 6.7  6.1 - 8.1 g/dL Final  . Albumin 12/28/2016 3.8  3.6 - 5.1 g/dL Final  . Calcium 12/28/2016 9.1  8.6 - 10.3 mg/dL Final  . GFR, Est African American 12/28/2016 89  >=60 mL/min Final  . GFR, Est Non African American 12/28/2016 77  >=60 mL/min Final  . TSH 12/28/2016 1.04  0.40 - 4.50 mIU/L Final  . Cholesterol 12/28/2016 96  <200 mg/dL Final  . Triglycerides 12/28/2016 67  <150 mg/dL Final  . HDL 12/28/2016 43  >40 mg/dL Final  .  Total CHOL/HDL Ratio 12/28/2016 2.2  <5.0 Ratio Final  . VLDL 12/28/2016 13  <30 mg/dL Final  . LDL Cholesterol 12/28/2016 40  <100 mg/dL Final  . WBC 12/28/2016 6.8  3.8 - 10.8 K/uL Final  . RBC 12/28/2016 4.88  4.20 - 5.80 MIL/uL Final  . Hemoglobin 12/28/2016 14.9  13.0 - 17.0 g/dL Final  . HCT 12/28/2016 44.1  38.5 - 50.0 % Final  . MCV 12/28/2016 90.4  80.0 - 100.0 fL Final  . MCH 12/28/2016 30.5  27.0 - 33.0 pg Final  . MCHC 12/28/2016 33.8  32.0 - 36.0 g/dL Final  . RDW 12/28/2016 13.3  11.0 - 15.0 % Final  . Platelets 12/28/2016 244  140 - 400 K/uL Final  . MPV 12/28/2016 10.3  7.5 - 12.5 fL Final  . Neutro Abs 12/28/2016 3944  1,500 - 7,800 cells/uL Final  . Lymphs Abs 12/28/2016 2108  850 - 3,900 cells/uL Final  . Monocytes Absolute 12/28/2016 544  200 - 950 cells/uL Final  . Eosinophils Absolute 12/28/2016 204  15 - 500 cells/uL Final  . Basophils Absolute 12/28/2016 0  0 - 200 cells/uL Final  . Neutrophils Relative % 12/28/2016 58  % Final  . Lymphocytes Relative 12/28/2016  31  % Final  . Monocytes Relative 12/28/2016 8  % Final  . Eosinophils Relative 12/28/2016 3  % Final  . Basophils Relative 12/28/2016 0  % Final  . Smear Review 12/28/2016 Criteria for review not met   Final  . Hgb A1c MFr Bld 12/28/2016 6.6* <5.7 % Final   Comment:   For someone without known diabetes, a hemoglobin A1c value of 6.5% or greater indicates that they may have diabetes and this should be confirmed with a follow-up test.   For someone with known diabetes, a value <7% indicates that their diabetes is well controlled and a value greater than or equal to 7% indicates suboptimal control. A1c targets should be individualized based on duration of diabetes, age, comorbid conditions, and other considerations.   Currently, no consensus exists for use of hemoglobin A1c for diagnosis of diabetes for children.     . Mean Plasma Glucose 12/28/2016 143  mg/dL Final  . PSA 12/28/2016 1.1  <=4.0  ng/mL Final   Comment:   The total PSA value from this assay system is standardized against the WHO standard. The test result will be approximately 20% lower when compared to the equimolar-standardized total PSA ([employer]). Comparison of serial PSA results should be interpreted with this fact in mind.   This test was performed using the Siemens chemiluminescent method. Values obtained from different assay methods cannot be used interchangeably. PSA levels, regardless of value, should not be interpreted as absolute evidence of the presence or absence of disease.      Past Medical History:  Diagnosis Date  . Coronary artery disease 2011    2 stents placed  . H/O hiatal hernia   . Hyperlipidemia    borderline; status post stress test x2, the last one 5-6 years ago and without significant abnormality per the patient  . Hypertension    for approximately 5 years  . Myocardial infarction 2011  . Prediabetes    Past Surgical History:  Procedure Laterality Date  . CHOLECYSTECTOMY    . COLONOSCOPY    . CORONARY ANGIOPLASTY     DES to mid and distal RCA 05/15/10  . ROTATOR CUFF REPAIR Left 10/20/2013   Dr Marlou Sa  . SHOULDER ARTHROSCOPY WITH OPEN ROTATOR CUFF REPAIR Left 10/20/2013   Procedure: SHOULDER ARTHROSCOPY WITH OPEN ROTATOR CUFF REPAIR;  Surgeon: Meredith Pel, MD;  Location: [facility];  Service: Orthopedics;  Laterality: Left;  Left shoulder arthroscopy, debridement, open biceps tenodesis and rotator cuff repair.   Current Outpatient Prescriptions on File Prior to Visit  Medication Sig Dispense Refill  . aspirin EC 81 MG tablet Take 81 mg by mouth daily.    Marland Kitchen atorvastatin (LIPITOR) 40 MG tablet Take 1 tablet (40 mg total) by mouth daily. 90 tablet 3  . Cholecalciferol (VITAMIN D) 2000 UNITS tablet Take 2,000 Units by mouth 2 (two) times daily.    . clopidogrel (PLAVIX) 75 MG tablet TAKE 1 TABLET(75 MG) BY MOUTH DAILY 90 tablet 3  . fexofenadine (ALLEGRA) 180 MG tablet  Take 180 mg by mouth daily.    . metoprolol tartrate (LOPRESSOR) 25 MG tablet Take 0.5 tablets (12.5 mg total) by mouth 2 (two) times daily. 90 tablet 3  . Multiple Vitamin (MULTIVITAMIN WITH MINERALS) TABS tablet Take 1 tablet by mouth daily.    . nitroGLYCERIN (NITROSTAT) 0.4 MG SL tablet Place 1 tablet (0.4 mg total) under the tongue every 5 (five) minutes as needed for chest pain. 25 tablet 4  .  Omega-3 Fatty Acids (FISH OIL TRIPLE STRENGTH) 1400 MG CAPS Take 1,400 mg by mouth daily.    . ranitidine (ZANTAC) 75 MG tablet Take 75 mg by mouth at bedtime.    . valsartan (DIOVAN) 160 MG tablet Take 1 tablet (160 mg total) by mouth daily. 90 tablet 3   No current facility-administered medications on file prior to visit.    No Known Allergies Social History   Social History  . Marital status: Married    Spouse name: N/A  . Number of children: N/A  . Years of education: N/A   Occupational History  . Not on file.   Social History Main Topics  . Smoking status: Former Smoker    Types: Cigarettes    Quit date: 12/03/1968  . Smokeless tobacco: Never Used  . Alcohol use No  . Drug use: No  . Sexual activity: Not on file   Other Topics Concern  . Not on file   Social History Narrative   Quit tobacco greater than 20 years ago with approximately 10 pack-year history. Denies alcohol or drug abuse   Family History  Problem Relation Age of Onset  . Heart disease Father   . Heart attack    . Sudden death Paternal Uncle     possible MI     Review of Systems  All other systems reviewed and are negative.      Objective:   Physical Exam  Constitutional: He is oriented to person, place, and time. He appears well-developed and well-nourished. No distress.  HENT:  Head: Normocephalic and atraumatic.  Right Ear: External ear normal.  Left Ear: External ear normal.  Nose: Nose normal.  Mouth/Throat: Oropharynx is clear and moist. No oropharyngeal exudate.  Eyes: Conjunctivae and EOM  are normal. Pupils are equal, round, and reactive to light. Right eye exhibits no discharge. Left eye exhibits no discharge. No scleral icterus.  Neck: Normal range of motion. Neck supple. No JVD present. No tracheal deviation present. No thyromegaly present.  Cardiovascular: Normal rate, regular rhythm, normal heart sounds and intact distal pulses.  Exam reveals no gallop and no friction rub.   No murmur heard. Pulmonary/Chest: Effort normal and breath sounds normal. No stridor. No respiratory distress. He has no wheezes. He has no rales. He exhibits no tenderness.  Abdominal: Soft. Bowel sounds are normal. He exhibits no distension and no mass. There is no tenderness. There is no rebound and no guarding.  Genitourinary: Rectum normal, prostate normal and penis normal.  Musculoskeletal: Normal range of motion. He exhibits no edema or tenderness.  Lymphadenopathy:    He has no cervical adenopathy.  Neurological: He is alert and oriented to person, place, and time. He has normal reflexes. No cranial nerve deficit. He exhibits normal muscle tone. Coordination normal.  Skin: Skin is warm. Rash noted. He is not diaphoretic.  Psychiatric: He has a normal mood and affect. His behavior is normal. Judgment and thought content normal.  Vitals reviewed.         Assessment & Plan:  Routine general medical examination at a health care facility  OAB (overactive bladder)  Controlled type 2 diabetes mellitus without complication, without long-term current use of insulin (HCC)  ASCVD (arteriosclerotic cardiovascular disease)  I'll schedule the patient to see GI for repeat colonoscopy. Prostate exam is normal. PSA is excellent. Try myrbetrig 25 poqday and see if symptoms improve. Patient will check on the shingles vaccine. Cholesterol is acceptable. He will check his blood pressure everyday and  notify me the values in 2 weeks. Today the blood pressures too high. I recommended 10-20 pounds weight loss and  increasing aerobic exercise as well as low carbohydrate diet to manage his diabetes.

## 2017-01-02 ENCOUNTER — Encounter: Payer: Self-pay | Admitting: Nurse Practitioner

## 2017-01-09 ENCOUNTER — Ambulatory Visit (INDEPENDENT_AMBULATORY_CARE_PROVIDER_SITE_OTHER): Payer: Medicare Other | Admitting: Nurse Practitioner

## 2017-01-09 ENCOUNTER — Encounter: Payer: Self-pay | Admitting: Nurse Practitioner

## 2017-01-09 VITALS — BP 118/68 | HR 82 | Ht 70.0 in | Wt 232.1 lb

## 2017-01-09 DIAGNOSIS — Z7901 Long term (current) use of anticoagulants: Secondary | ICD-10-CM | POA: Diagnosis not present

## 2017-01-09 DIAGNOSIS — Z1211 Encounter for screening for malignant neoplasm of colon: Secondary | ICD-10-CM

## 2017-01-09 MED ORDER — NA SULFATE-K SULFATE-MG SULF 17.5-3.13-1.6 GM/177ML PO SOLN: 1.0000 | Freq: Once | ORAL | 0 refills | Status: AC

## 2017-01-09 NOTE — Patient Instructions (Signed)
If you are age 68 or older, your body mass index should be between 23-30. Your Body mass index is 33.31 kg/m. If this is out of the aforementioned range listed, please consider follow up with your Primary Care Provider.  If you are age 68 or younger, your body mass index should be between 19-25. Your Body mass index is 33.31 kg/m. If this is out of the aformentioned range listed, please consider follow up with your Primary Care Provider.   We have sent the following medications to your pharmacy for you to pick up at your convenience:  Suprep  You have been scheduled for a colonoscopy. Please follow written instructions given to you at your visit today.  Please pick up your prep supplies at the pharmacy within the next 1-3 days. If you use inhalers (even only as needed), please bring them with you on the day of your procedure. Your physician has requested that you go to www.startemmi.com and enter the access code given to you at your visit today. This web site gives a general overview about your procedure. However, you should still follow specific instructions given to you by our office regarding your preparation for the procedure.  We will contact Dr Excell Seltzerooper in regards to stopping your Plavix prior to your procedure. You will be contacted with his response.  Thank you.

## 2017-01-09 NOTE — Progress Notes (Signed)
HPI: Patient is 66 year old male referred by PCP, Dr. Nils PyleWarren Picard, for colon cancer screening. He has a history of coronary artery disease, status post stent placement 2011. He is followed by Dr. Excell Seltzerooper and takes Plavix. No chest pain, no shortness of breath. Patient has no GI complaints. Specifically, he has no bowel changes, blood in stool, abdominal pain or unexpected weight loss. His last colonoscopy was greater than 10 years ago and done by [facility]   Past Medical History:  Diagnosis Date  . Coronary artery disease 2011    2 stents placed  . H/O hiatal hernia   . Hyperlipidemia    borderline; status post stress test x2, the last one 5-6 years ago and without significant abnormality per the patient  . Hypertension    for approximately 5 years  . Myocardial infarction 2011  . Prediabetes     Past Surgical History:  Procedure Laterality Date  . CHOLECYSTECTOMY    . COLONOSCOPY    . CORONARY ANGIOPLASTY     DES to mid and distal RCA 05/15/10  . ROTATOR CUFF REPAIR Left 10/20/2013   Dr August Saucerean  . SHOULDER ARTHROSCOPY WITH OPEN ROTATOR CUFF REPAIR Left 10/20/2013   Procedure: SHOULDER ARTHROSCOPY WITH OPEN ROTATOR CUFF REPAIR;  Surgeon: Cammy CopaGregory Scott Dean, MD;  [facility];  Service: Orthopedics;  Laterality: Left;  Left shoulder arthroscopy, debridement, open biceps tenodesis and rotator cuff repair.   Family History  Problem Relation Age of Onset  . Heart disease Father   . Heart attack    . Sudden death Paternal Uncle     possible MI   Social History  Substance Use Topics  . Smoking status: Former Smoker    Types: Cigarettes    Quit date: 12/03/1968  . Smokeless tobacco: Never Used  . Alcohol use No   Current Outpatient Prescriptions  Medication Sig Dispense Refill  . aspirin EC 81 MG tablet Take 81 mg by mouth daily.    Marland Kitchen. atorvastatin (LIPITOR) 40 MG tablet Take 1 tablet (40 mg total) by mouth daily. 90 tablet 3  . Cholecalciferol (VITAMIN D) 2000 UNITS  tablet Take 2,000 Units by mouth 2 (two) times daily.    . clopidogrel (PLAVIX) 75 MG tablet TAKE 1 TABLET(75 MG) BY MOUTH DAILY 90 tablet 3  . fexofenadine (ALLEGRA) 180 MG tablet Take 180 mg by mouth daily.    . metoprolol tartrate (LOPRESSOR) 25 MG tablet Take 0.5 tablets (12.5 mg total) by mouth 2 (two) times daily. 90 tablet 3  . Multiple Vitamin (MULTIVITAMIN WITH MINERALS) TABS tablet Take 1 tablet by mouth daily.    . nitroGLYCERIN (NITROSTAT) 0.4 MG SL tablet Place 1 tablet (0.4 mg total) under the tongue every 5 (five) minutes as needed for chest pain. 25 tablet 4  . Omega-3 Fatty Acids (FISH OIL TRIPLE STRENGTH) 1400 MG CAPS Take 1,400 mg by mouth daily.    . ranitidine (ZANTAC) 75 MG tablet Take 75 mg by mouth every morning.     . valsartan (DIOVAN) 160 MG tablet Take 1 tablet (160 mg total) by mouth daily. 90 tablet 3   No current facility-administered medications for this visit.    No Known Allergies   Review of Systems: Positive for urinary pain, frequency . All other systems reviewed and negative except where noted in HPI.    Physical Exam: BP 118/68   Pulse 82   Ht 5\' 10"  (1.778 m)   Wt 232 lb 2 oz (  105.3 kg)   BMI 33.31 kg/m  Constitutional:  Well-developed, white male in no acute distress. Psychiatric: Normal mood and affect. Behavior is normal. HEENT: Normocephalic and atraumatic. Conjunctivae are normal. No scleral icterus. Neck supple.  Cardiovascular: Normal rate, regular rhythm.  Pulmonary/chest: Effort normal and breath sounds normal. No wheezing, rales or rhonchi. Abdominal: Soft, nondistended, nontender. Bowel sounds active throughout. There are no masses palpable. No hepatomegaly. Extremities: no edema Lymphadenopathy: No cervical adenopathy noted. Neurological: Alert and oriented to person place and time. Skin: Skin is warm and dry. No rashes noted.   ASSESSMENT AND PLAN:  68. 66 year old male for colon cancer screening. No GI symptoms.  -Patient  will be scheduled for a screening colonoscopy with possible polypectomy.  The risks and benefits of the procedure were discussed and the patient agrees to proceed. Patient is on Plavix, see #2  2. CAD / DES 2011, maintained on Plavix. Hold plavix 5 days before procedure - will instruct when and how to resume after procedure. Patient understands there is a low but real risk of a cardiovascular event such as heart attack or thrombosis while off Plavix and he agrees to proceed. Will communicate by phone or EMR with patient's prescribing provider to confirm that holding Plavix is reasonable in this case.   3. DM2, controlled  Willette Cluster, NP  01/09/2017, 11:28 AM  Cc:  Donita Brooks, MD

## 2017-01-13 NOTE — Progress Notes (Signed)
Ok to hold plavix x 5 days. thanks 

## 2017-01-14 NOTE — Progress Notes (Signed)
Reviewed and agree with management plan.  Toniann Dickerson T. Kisean Rollo, MD FACG 

## 2017-01-17 ENCOUNTER — Telehealth: Payer: Self-pay

## 2017-01-17 NOTE — Telephone Encounter (Signed)
-----   Message from Tonny BollmanMichael Cooper, MD sent at 01/13/2017  9:28 PM EST -----   ----- Message ----- From: Dorene SorrowAshley N Ryin Ambrosius, LPN Sent: 4/0/98112/06/2017  11:40 AM To: Tonny BollmanMichael Cooper, MD

## 2017-01-17 NOTE — Telephone Encounter (Signed)
Message resent to Dr Excell Seltzerooper.

## 2017-01-18 ENCOUNTER — Telehealth: Payer: Self-pay | Admitting: Cardiovascular Disease

## 2017-01-18 NOTE — Telephone Encounter (Signed)
I will forward to Dr Cooper for review.  

## 2017-01-18 NOTE — Telephone Encounter (Signed)
[facility] at [facility] and she states that Dr Excell Seltzerooper and his nurse are not in the office today but that she would send message to triage and someone will call back. Phone and fax number given to our office.

## 2017-01-18 NOTE — Telephone Encounter (Signed)
New message  Request for surgical clearance:  1. What type of surgery is being performed? colonoscopy   2. When is this surgery scheduled? 4.5.2018   3. Are there any medications that need to be held prior to surgery and how long? clopidogrel (PLAVIX) 75 MG tablet  4. Name of physician performing surgery? Dr. Russella DarStark   5. What is your office phone and fax number? [phone] /  FAX [phone]

## 2017-01-20 NOTE — Telephone Encounter (Signed)
Pt cleared to hold plavix 5 days prior to colonoscopy. May resume after procedure as instructed by Dr Russella DarStark. thanks

## 2017-01-21 ENCOUNTER — Telehealth: Payer: Self-pay

## 2017-01-21 ENCOUNTER — Other Ambulatory Visit: Payer: Self-pay

## 2017-01-21 MED ORDER — NA SULFATE-K SULFATE-MG SULF 17.5-3.13-1.6 GM/177ML PO SOLN: 1.0000 | Freq: Once | ORAL | 0 refills | Status: AC

## 2017-01-21 NOTE — Telephone Encounter (Signed)
I will forward to Dr Russella DarStark.

## 2017-01-21 NOTE — Telephone Encounter (Signed)
Left message informing pt to d/c Plavix 5 days prior to procedure

## 2017-01-21 NOTE — Telephone Encounter (Signed)
[facility] [place]

## 2017-01-24 NOTE — Telephone Encounter (Signed)
Pt informed that we do not do Prior Auths on Preps. [facility] [place] to help with cost. Pt understood.

## 2017-01-24 NOTE — Telephone Encounter (Signed)
Pt wife states that suprep needs prior auth from ins, so pt would like to know what to do for colon on 03-07-17

## 2017-01-24 NOTE — Progress Notes (Signed)
Agree with plan as documented - ok to hold plavix x 5 days for endoscopy

## 2017-01-25 NOTE — Progress Notes (Signed)
Pt informed to hold Plavix 5 days prior to endoscopy procedure.

## 2017-02-26 ENCOUNTER — Encounter: Payer: Self-pay | Admitting: Gastroenterology

## 2017-03-07 ENCOUNTER — Encounter: Payer: Self-pay | Admitting: Gastroenterology

## 2017-03-07 ENCOUNTER — Ambulatory Visit (AMBULATORY_SURGERY_CENTER): Payer: Medicare Other | Admitting: Gastroenterology

## 2017-03-07 VITALS — BP 105/63 | HR 62 | Temp 97.1°F | Resp 11 | Ht 70.0 in | Wt 232.0 lb

## 2017-03-07 DIAGNOSIS — Z1212 Encounter for screening for malignant neoplasm of rectum: Secondary | ICD-10-CM

## 2017-03-07 DIAGNOSIS — Z1211 Encounter for screening for malignant neoplasm of colon: Secondary | ICD-10-CM

## 2017-03-07 DIAGNOSIS — I2510 Atherosclerotic heart disease of native coronary artery without angina pectoris: Secondary | ICD-10-CM | POA: Diagnosis not present

## 2017-03-07 DIAGNOSIS — I10 Essential (primary) hypertension: Secondary | ICD-10-CM | POA: Diagnosis not present

## 2017-03-07 MED ORDER — SODIUM CHLORIDE 0.9 % IV SOLN: 500.0000 mL | INTRAVENOUS | Status: DC

## 2017-03-07 NOTE — Progress Notes (Signed)
Verbal order given at bedside for patient to resume plavix tomorrow at prior dose.

## 2017-03-07 NOTE — Op Note (Signed)
[facility] Patient Name: Paul Rubio Procedure Date: 03/07/2017 8:39 AM MRN: [id number] Endoscopist: Meryl Dare , MD Age: 66 Referring MD:  Date of Birth: 02/08/1951 Gender: Male Account #: [id number] Procedure:                Colonoscopy Indications:              Screening for colorectal malignant neoplasm Medicines:                Monitored Anesthesia Care Procedure:                Pre-Anesthesia Assessment:                           - Prior to the procedure, a History and Physical                            was performed, and patient medications and                            allergies were reviewed. The patient's tolerance of                            previous anesthesia was also reviewed. The risks                            and benefits of the procedure and the sedation                            options and risks were discussed with the patient.                            All questions were answered, and informed consent                            was obtained. Prior Anticoagulants: The patient has                            taken Plavix (clopidogrel), last dose was 3 days                            prior to procedure. ASA Grade Assessment: III - A                            patient with severe systemic disease. After                            reviewing the risks and benefits, the patient was                            deemed in satisfactory condition to undergo the                            procedure.  After obtaining informed consent, the colonoscope                            was passed under direct vision. Throughout the                            procedure, the patient's blood pressure, pulse, and                            oxygen saturations were monitored continuously. The                            Colonoscope was introduced through the anus and                            advanced to the the cecum, identified by                 appendiceal orifice and ileocecal valve. The                            ileocecal valve, appendiceal orifice, and rectum                            were photographed. The quality of the bowel                            preparation was good. The colonoscopy was performed                            without difficulty. The patient tolerated the                            procedure well. Scope In: 8:50:24 AM Scope Out: 9:01:32 AM Scope Withdrawal Time: 0 hours 9 minutes 49 seconds  Total Procedure Duration: 0 hours 11 minutes 8 seconds  Findings:                 The perianal and digital rectal examinations were                            normal.                           Four small localized angiodysplastic lesions                            without bleeding were found in the ascending colon                            and in the cecum.                           A few medium-mouthed diverticula were found in the                            sigmoid colon.  There was no evidence of                            diverticular bleeding.                           Internal hemorrhoids were found during                            retroflexion. The hemorrhoids were small and Grade                            I (internal hemorrhoids that do not prolapse).                           The exam was otherwise without abnormality on                            direct and retroflexion views. Complications:            No immediate complications. Estimated blood loss:                            None. Estimated Blood Loss:     Estimated blood loss: none. Impression:               - Four non-bleeding colonic angiodysplastic lesions.                           - Mild diverticulosis in the sigmoid colon. There                            was no evidence of diverticular bleeding.                           - Internal hemorrhoids.                           - No specimens collected. Recommendation:           -  Repeat colonoscopy in 10 years for screening                            purposes.                           - Resume Plavix (clopidogrel) tomorrow at prior                            dose. Refer to managing physician for further                            adjustment of therapy.                           - Patient has a contact number available for  emergencies. The signs and symptoms of potential                            delayed complications were discussed with the                            patient. Return to normal activities tomorrow.                            Written discharge instructions were provided to the                            patient.                           - Resume previous diet.                           - Continue present medications. Meryl Dare, MD 03/07/2017 9:06:16 AM This report has been signed electronically.

## 2017-03-07 NOTE — Progress Notes (Signed)
Report given to PACU, vss 

## 2017-03-07 NOTE — Patient Instructions (Addendum)
YOU HAD AN ENDOSCOPIC PROCEDURE TODAY AT THE [facility]:   Refer to the procedure report that was given to you for any specific questions about what was found during the examination.  If the procedure report does not answer your questions, please call your gastroenterologist to clarify.  If you requested that your care partner not be given the details of your procedure findings, then the procedure report has been included in a sealed envelope for you to review at your convenience later.  YOU SHOULD EXPECT: Some feelings of bloating in the abdomen. Passage of more gas than usual.  Walking can help get rid of the air that was put into your GI tract during the procedure and reduce the bloating. If you had a lower endoscopy (such as a colonoscopy or flexible sigmoidoscopy) you may notice spotting of blood in your stool or on the toilet paper. If you underwent a bowel prep for your procedure, you may not have a normal bowel movement for a few days.  Please Note:  You might notice some irritation and congestion in your nose or some drainage.  This is from the oxygen used during your procedure.  There is no need for concern and it should clear up in a day or so.  SYMPTOMS TO REPORT IMMEDIATELY:   Following lower endoscopy (colonoscopy or flexible sigmoidoscopy):  Excessive amounts of blood in the stool  Significant tenderness or worsening of abdominal pains  Swelling of the abdomen that is new, acute  Fever of 100F or higher   For urgent or emergent issues, a gastroenterologist can be reached at any hour by calling [phone].   DIET:  We do recommend a small meal at first, but then you may proceed to your regular diet.  Drink plenty of fluids but you should avoid alcoholic beverages for 24 hours.  ACTIVITY:  You should plan to take it easy for the rest of today and you should NOT DRIVE or use heavy machinery until tomorrow (because of the sedation medicines used during the test).     FOLLOW UP: Our staff will call the number listed on your records the next business day following your procedure to check on you and address any questions or concerns that you may have regarding the information given to you following your procedure. If we do not reach you, we will leave a message.  However, if you are feeling well and you are not experiencing any problems, there is no need to return our call.  We will assume that you have returned to your regular daily activities without incident.  If any biopsies were taken you will be contacted by phone or by letter within the next 1-3 weeks.  Please call us at [phone] if you have not heard about the biopsies in 3 weeks.    SIGNATURES/CONFIDENTIALITY: You and/or your care partner have signed paperwork which will be entered into your electronic medical record.  These signatures attest to the fact that that the information above on your After Visit Summary has been reviewed and is understood.  Full responsibility of the confidentiality of this discharge information lies with you and/or your care-partner.  Resume Plavix tomorrow at prior dose,resume remainder of medications. Information given on hemorrhoids and diverticulosis.

## 2017-03-08 ENCOUNTER — Telehealth: Payer: Self-pay

## 2017-03-08 NOTE — Telephone Encounter (Signed)
  Follow up Call-  Call back number 03/07/2017  Post procedure Call Back phone  # [phone]  Permission to leave phone message Yes  Some recent data might be hidden     LMOM to call us back if any issues such as fever or pain.

## 2017-06-24 ENCOUNTER — Telehealth: Payer: Self-pay | Admitting: Family Medicine

## 2017-06-24 MED ORDER — LOSARTAN POTASSIUM 50 MG PO TABS: 50.0000 mg | ORAL_TABLET | Freq: Every day | ORAL | 3 refills | Status: DC

## 2017-06-24 NOTE — Telephone Encounter (Signed)
Pt on Valsartan 160 mg that has been recalled - Per [facility] change to Losartan 50 mg qd.   Pt's wife aware and [facility]

## 2017-08-01 ENCOUNTER — Telehealth: Payer: Self-pay | Admitting: Pharmacist

## 2017-08-01 NOTE — Telephone Encounter (Signed)
Received notification from pharmacy that pt was affected by valsartan recall. Called pt and he states his medication was already changed by another provider to losartan 50mg  daily. Medication list has been updated.

## 2017-10-01 ENCOUNTER — Ambulatory Visit (INDEPENDENT_AMBULATORY_CARE_PROVIDER_SITE_OTHER): Payer: Medicare Other

## 2017-10-01 DIAGNOSIS — Z23 Encounter for immunization: Secondary | ICD-10-CM

## 2017-10-01 NOTE — Progress Notes (Signed)
Patient was seen in office for the flu injection.Patient received vaccine in left deltoid .Patient tolerated well

## 2017-10-04 ENCOUNTER — Encounter: Payer: Self-pay | Admitting: Cardiovascular Disease

## 2017-10-17 ENCOUNTER — Encounter: Payer: Self-pay | Admitting: Cardiovascular Disease

## 2017-10-17 ENCOUNTER — Ambulatory Visit: Payer: Medicare Other | Admitting: Cardiovascular Disease

## 2017-10-17 VITALS — BP 136/78 | HR 69 | Ht 69.0 in | Wt 231.4 lb

## 2017-10-17 DIAGNOSIS — I2510 Atherosclerotic heart disease of native coronary artery without angina pectoris: Secondary | ICD-10-CM

## 2017-10-17 DIAGNOSIS — I10 Essential (primary) hypertension: Secondary | ICD-10-CM

## 2017-10-17 DIAGNOSIS — E782 Mixed hyperlipidemia: Secondary | ICD-10-CM

## 2017-10-17 MED ORDER — ATORVASTATIN CALCIUM 40 MG PO TABS
40.0000 mg | ORAL_TABLET | Freq: Every day | ORAL | 3 refills | Status: DC
Start: 2017-10-17 — End: 2018-08-11

## 2017-10-17 MED ORDER — METOPROLOL TARTRATE 25 MG PO TABS: 12.5000 mg | ORAL_TABLET | Freq: Two times a day (BID) | ORAL | 3 refills | Status: DC

## 2017-10-17 NOTE — Progress Notes (Signed)
Cardiology Office Note Date:  10/17/2017   ID:  Paul CollaDonald G Lazarus, DOB [birth year], MRN [id number]  PCP:  Donita BrooksPickard, Paul Rubio, Paul Rubio  Cardiologist:  Paul HivesMichale Meryn Sarracino, Paul Rubio    Chief Complaint  Patient presents with  . Coronary Artery Disease     History of Present Illness: Paul Rubio is a [age] y.o. male who presents for followup evaluation. The patient has coronary artery disease and he had a non-ST elevation infarction in 2011. He was noted to have severe stenoses in the mid and distal RCA, both lesions treated with drug-eluting stent platforms. He had mild nonobstructive disease in the LAD and left circumflex vessels. The patient also had mild segmental LV dysfunction with inferior wall hypokinesis and an ejection fraction of 50%.  The patient is here with his wife today.  He is doing fairly well.  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  He works over 12 hours almost every day.  He does not extra time for exercise.  He denies any symptoms of chest pain, chest pressure, shortness of breath, lightheadedness, or heart palpitations.  He is compliant with his medications.  Past Medical History:  Diagnosis Date  . Coronary artery disease 2011    2 stents placed  . H/O hiatal hernia   . Hyperlipidemia    borderline; status post stress test x2, the last one 5-6 years ago and without significant abnormality per the patient  . Hypertension    for approximately 5 years  . Myocardial infarction (HCC) 2011  . Prediabetes     Past Surgical History:  Procedure Laterality Date  . CHOLECYSTECTOMY    . COLONOSCOPY    . CORONARY ANGIOPLASTY     DES to mid and distal RCA 05/15/10  . ROTATOR CUFF REPAIR Left 10/20/2013   Dr August Saucerean  . SHOULDER ARTHROSCOPY WITH OPEN ROTATOR CUFF REPAIR Left 10/20/2013   Procedure: SHOULDER ARTHROSCOPY WITH OPEN ROTATOR CUFF REPAIR;  Surgeon: Cammy CopaGregory Scott Dean, Paul Rubio;  [facility];  Service: Orthopedics;  Laterality: Left;  Left shoulder arthroscopy, debridement, open  biceps tenodesis and rotator cuff repair.    Current Outpatient Medications  Medication Sig Dispense Refill  . Ascorbic Acid (VITAMIN C PO) Take 1 tablet daily by mouth.    Marland Kitchen. aspirin EC 81 MG tablet Take 81 mg by mouth daily.    Marland Kitchen. atorvastatin (LIPITOR) 40 MG tablet Take 1 tablet (40 mg total) daily by mouth. 90 tablet 3  . Cholecalciferol (VITAMIN D) 2000 UNITS tablet Take 2,000 Units by mouth 2 (two) times daily.    . Cyanocobalamin (VITAMIN B-12 PO) Take 1 tablet daily by mouth.    . fexofenadine (ALLEGRA) 180 MG tablet Take 180 mg by mouth daily.    Marland Kitchen. losartan (COZAAR) 50 MG tablet Take 1 tablet (50 mg total) by mouth daily. 90 tablet 3  . metoprolol tartrate (LOPRESSOR) 25 MG tablet Take 0.5 tablets (12.5 mg total) 2 (two) times daily by mouth. 90 tablet 3  . Multiple Vitamin (MULTIVITAMIN WITH MINERALS) TABS tablet Take 1 tablet by mouth daily.    . nitroGLYCERIN (NITROSTAT) 0.4 MG SL tablet Place 1 tablet (0.4 mg total) under the tongue every 5 (five) minutes as needed for chest pain. 25 tablet 4  . Omega-3 Fatty Acids (FISH OIL TRIPLE STRENGTH) 1400 MG CAPS Take 1,400 mg by mouth daily.    . ranitidine (ZANTAC) 75 MG tablet Take 75 mg by mouth every morning.      Current Facility-Administered Medications  Medication Dose Route Frequency Provider Last Rate Last Dose  . 0.9 %  sodium chloride infusion  500 mL Intravenous Continuous Meryl DareStark, Paul Rubio, Paul Rubio        Allergies:   Patient has no known allergies.   Social History:  The patient  reports that he quit smoking about 48 years ago. His smoking use included cigarettes. he has never used smokeless tobacco. He reports that he does not drink alcohol or use drugs.   Family History:  The patient's family history includes Heart attack in his unknown relative; Heart disease in his father; Sudden death in his paternal uncle.    ROS:  Please see the history of present illness.  All other systems are reviewed and negative.    PHYSICAL  EXAM: VS:  BP 136/78   Pulse 69   Ht 5\' 9"  (1.753 m)   Wt 231 lb 6.4 oz (105 kg)   BMI 34.17 kg/m  , BMI Body mass index is 34.17 kg/m. GEN: Well nourished, well developed, in no acute distress  HEENT: normal  Neck: no JVD, no masses. No carotid bruits Cardiac: RRR without murmur or gallop                Respiratory:  clear to auscultation bilaterally, normal work of breathing GI: soft, nontender, nondistended, + BS MS: no deformity or atrophy  Ext: no pretibial edema, pedal pulses 2+= bilaterally Skin: warm and dry, no rash Neuro:  Strength and sensation are intact Psych: euthymic mood, full affect  EKG:  EKG is ordered today. The ekg ordered today shows sinus rhythm 69 with occasional PVC  Recent Labs: 12/28/2016: ALT 36; BUN 24; Creat 1.01; Hemoglobin 14.9; Platelets 244; Potassium 4.5; Sodium 140; TSH 1.04   Lipid Panel     Component Value Date/Time   CHOL 96 12/28/2016 0830   TRIG 67 12/28/2016 0830   HDL 43 12/28/2016 0830   CHOLHDL 2.2 12/28/2016 0830   VLDL 13 12/28/2016 0830   LDLCALC 40 12/28/2016 0830      Wt Readings from Last 3 Encounters:  10/17/17 231 lb 6.4 oz (105 kg)  03/07/17 232 lb (105.2 kg)  01/09/17 232 lb 2 oz (105.3 kg)     ASSESSMENT AND PLAN: 1.  CAD, native vessel: no angina.  The patient is now 7 years from his PCI procedure.  He has had no recurrent events.  Recommend discontinue clopidogrel per current guidelines.  He will remain on aspirin 81 mg daily.  We discussed the importance of regular exercise and dietary changes.  2. Hyperlipidemia: Lipids are excellent.  LDL cholesterol is 40 mg/dL.  He will continue atorvastatin 40 mg daily.  Diet and lifestyle reviewed.  3. HTN: Blood pressure is controlled on metoprolol and losartan.  We discussed his lifestyle at length today. 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 He understands he needs to make some changes. Fortunately his BP and  lipids remain well-controlled.   Current medicines are reviewed with the patient today.  The patient does not have concerns regarding medicines.  Labs/ tests ordered today include:   Orders Placed This Encounter  Procedures  . EKG 12-Lead    Disposition:   FU one year  Signed, Paul HivesMichale Marybell Robards, Paul Rubio  10/17/2017 5:52 PM    [facility] [address], [place], [place]  [id number] Phone: [phone]; Fax: [phone]

## 2017-10-17 NOTE — Patient Instructions (Signed)
Medication Instructions:  1) STOP PLAVIX  Labwork: None  Testing/Procedures: None  Follow-Up: Your provider wants you to follow-up in: 1 year with Dr. Cooper. You will receive a reminder letter in the mail two months in advance. If you don't receive a letter, please call our office to schedule the follow-up appointment.    Any Other Special Instructions Will Be Listed Below (If Applicable).     If you need a refill on your cardiac medications before your next appointment, please call your pharmacy.   

## 2017-11-18 ENCOUNTER — Other Ambulatory Visit: Payer: Self-pay | Admitting: Cardiovascular Disease

## 2017-11-19 ENCOUNTER — Other Ambulatory Visit: Payer: Self-pay | Admitting: Cardiovascular Disease

## 2017-12-05 DIAGNOSIS — D485 Neoplasm of uncertain behavior of skin: Secondary | ICD-10-CM | POA: Diagnosis not present

## 2017-12-05 DIAGNOSIS — C44519 Basal cell carcinoma of skin of other part of trunk: Secondary | ICD-10-CM | POA: Diagnosis not present

## 2017-12-05 DIAGNOSIS — L570 Actinic keratosis: Secondary | ICD-10-CM | POA: Diagnosis not present

## 2017-12-05 DIAGNOSIS — D2371 Other benign neoplasm of skin of right lower limb, including hip: Secondary | ICD-10-CM | POA: Diagnosis not present

## 2017-12-05 DIAGNOSIS — L821 Other seborrheic keratosis: Secondary | ICD-10-CM | POA: Diagnosis not present

## 2017-12-27 ENCOUNTER — Other Ambulatory Visit: Payer: Medicare Other

## 2017-12-30 ENCOUNTER — Other Ambulatory Visit: Payer: Self-pay | Admitting: Family Medicine

## 2017-12-30 ENCOUNTER — Other Ambulatory Visit: Payer: Medicare Other

## 2017-12-30 DIAGNOSIS — I10 Essential (primary) hypertension: Secondary | ICD-10-CM

## 2017-12-30 DIAGNOSIS — R7303 Prediabetes: Secondary | ICD-10-CM

## 2017-12-30 DIAGNOSIS — E782 Mixed hyperlipidemia: Secondary | ICD-10-CM

## 2017-12-30 DIAGNOSIS — Z125 Encounter for screening for malignant neoplasm of prostate: Secondary | ICD-10-CM

## 2017-12-31 LAB — CBC WITH DIFFERENTIAL/PLATELET
Basophils Absolute: 17 cells/uL (ref 0–200)
Basophils Relative: 0.2 %
Eosinophils Absolute: 78 cells/uL (ref 15–500)
Eosinophils Relative: 0.9 %
HCT: 43.6 % (ref 38.5–50.0)
Hemoglobin: 15.3 g/dL (ref 13.2–17.1)
Lymphs Abs: 2105 cells/uL (ref 850–3900)
MCH: 30.7 pg (ref 27.0–33.0)
MCHC: 35.1 g/dL (ref 32.0–36.0)
MCV: 87.4 fL (ref 80.0–100.0)
MPV: 11.1 fL (ref 7.5–12.5)
Monocytes Relative: 6.3 %
Neutro Abs: 5951 cells/uL (ref 1500–7800)
Neutrophils Relative %: 68.4 %
Platelets: 239 10*3/uL (ref 140–400)
RBC: 4.99 10*6/uL (ref 4.20–5.80)
RDW: 11.9 % (ref 11.0–15.0)
Total Lymphocyte: 24.2 %
WBC mixed population: 548 cells/uL (ref 200–950)
WBC: 8.7 10*3/uL (ref 3.8–10.8)

## 2017-12-31 LAB — COMPREHENSIVE METABOLIC PANEL
AG Ratio: 1.4 (calc) (ref 1.0–2.5)
ALT: 36 U/L (ref 9–46)
AST: 26 U/L (ref 10–35)
Albumin: 4.2 g/dL (ref 3.6–5.1)
Alkaline phosphatase (APISO): 69 U/L (ref 40–115)
BUN: 22 mg/dL (ref 7–25)
CO2: 30 mmol/L (ref 20–32)
Calcium: 9.4 mg/dL (ref 8.6–10.3)
Chloride: 104 mmol/L (ref 98–110)
Creat: 1.12 mg/dL (ref 0.70–1.25)
Globulin: 3.1 g/dL (calc) (ref 1.9–3.7)
Glucose, Bld: 143 mg/dL — ABNORMAL HIGH (ref 65–99)
Potassium: 4.8 mmol/L (ref 3.5–5.3)
Sodium: 141 mmol/L (ref 135–146)
Total Bilirubin: 0.4 mg/dL (ref 0.2–1.2)
Total Protein: 7.3 g/dL (ref 6.1–8.1)

## 2017-12-31 LAB — LIPID PANEL
Cholesterol: 105 mg/dL (ref <200)
HDL: 42 mg/dL (ref >40)
LDL Cholesterol (Calc): 46 mg/dL (calc)
Non-HDL Cholesterol (Calc): 63 mg/dL (calc) (ref <130)
Total CHOL/HDL Ratio: 2.5 (calc) (ref <5.0)
Triglycerides: 85 mg/dL (ref <150)

## 2017-12-31 LAB — HEMOGLOBIN A1C
Hgb A1c MFr Bld: 6.9 % of total Hgb — ABNORMAL HIGH (ref <5.7)
Mean Plasma Glucose: 151 (calc)
eAG (mmol/L): 8.4 (calc)

## 2017-12-31 LAB — PSA: PSA: 1.5 ng/mL (ref <=4.0)

## 2018-01-03 ENCOUNTER — Encounter: Payer: Self-pay | Admitting: Family Medicine

## 2018-01-03 ENCOUNTER — Ambulatory Visit (INDEPENDENT_AMBULATORY_CARE_PROVIDER_SITE_OTHER): Payer: Medicare Other | Admitting: Family Medicine

## 2018-01-03 VITALS — BP 120/70 | HR 78 | Temp 98.2°F | Resp 16 | Ht 68.0 in | Wt 227.0 lb

## 2018-01-03 DIAGNOSIS — I10 Essential (primary) hypertension: Secondary | ICD-10-CM | POA: Diagnosis not present

## 2018-01-03 DIAGNOSIS — Z1159 Encounter for screening for other viral diseases: Secondary | ICD-10-CM

## 2018-01-03 DIAGNOSIS — Z0000 Encounter for general adult medical examination without abnormal findings: Secondary | ICD-10-CM

## 2018-01-03 DIAGNOSIS — E7800 Pure hypercholesterolemia, unspecified: Secondary | ICD-10-CM | POA: Diagnosis not present

## 2018-01-03 DIAGNOSIS — E119 Type 2 diabetes mellitus without complications: Secondary | ICD-10-CM | POA: Diagnosis not present

## 2018-01-03 NOTE — Progress Notes (Signed)
Subjective:    Patient ID: Paul Rubio, male    DOB: [birth year], 67 y.o.   MRN: [id number]  HPI  01/01/17 Patient is here today for complete physical exam. Immunizations are up-to-date. We did discuss the shingles vaccine and he will check on the price of that. He is overdue for colonoscopy. This was not scheduled last year and this needs to be scheduled. Patient has gained 5 pounds since his last office visit his blood pressure slightly elevated. He continues to have symptoms of overactive bladder including frequency and urgency. He does report strong stream and his PSA is reassuring. Furthermore his rectal exam is normal and therefore I do not feel it has any relationship to BPH.  At that time, my plan was: I'll schedule the patient to see GI for repeat colonoscopy. Prostate exam is normal. PSA is excellent. Try myrbetrig 25 poqday and see if symptoms improve. Patient will check on the shingles vaccine. Cholesterol is acceptable. He will check his blood pressure everyday and notify me the values in 2 weeks. Today the blood pressures too high. I recommended 10-20 pounds weight loss and increasing aerobic exercise as well as low carbohydrate diet to manage his diabetes.  01/03/18 Patient is here today for CPE.  Has not been seen since CPE last year.  Most recent labs are listed below: Appointment on 12/30/2017  Component Date Value Ref Range Status  . WBC 12/30/2017 8.7  3.8 - 10.8 Thousand/uL Final  . RBC 12/30/2017 4.99  4.20 - 5.80 Million/uL Final  . Hemoglobin 12/30/2017 15.3  13.2 - 17.1 g/dL Final  . HCT 82/95/621301/28/2019 43.6  38.5 - 50.0 % Final  . MCV 12/30/2017 87.4  80.0 - 100.0 fL Final  . MCH 12/30/2017 30.7  27.0 - 33.0 pg Final  . MCHC 12/30/2017 35.1  32.0 - 36.0 g/dL Final  . RDW 08/65/784601/28/2019 11.9  11.0 - 15.0 % Final  . Platelets 12/30/2017 239  140 - 400 Thousand/uL Final  . MPV 12/30/2017 11.1  7.5 - 12.5 fL Final  . Neutro Abs 12/30/2017 5,951  1,500 - 7,800 cells/uL Final  .  Lymphs Abs 12/30/2017 2,105  850 - 3,900 cells/uL Final  . WBC mixed population 12/30/2017 548  200 - 950 cells/uL Final  . Eosinophils Absolute 12/30/2017 78  15 - 500 cells/uL Final  . Basophils Absolute 12/30/2017 17  0 - 200 cells/uL Final  . Neutrophils Relative % 12/30/2017 68.4  % Final  . Total Lymphocyte 12/30/2017 24.2  % Final  . Monocytes Relative 12/30/2017 6.3  % Final  . Eosinophils Relative 12/30/2017 0.9  % Final  . Basophils Relative 12/30/2017 0.2  % Final  . Hgb A1c MFr Bld 12/30/2017 6.9* <5.7 % of total Hgb Final   Comment: For someone without known diabetes, a hemoglobin A1c value of 6.5% or greater indicates that they may have  diabetes and this should be confirmed with a follow-up  test. . For someone with known diabetes, a value <7% indicates  that their diabetes is well controlled and a value  greater than or equal to 7% indicates suboptimal  control. A1c targets should be individualized based on  duration of diabetes, age, comorbid conditions, and  other considerations. . Currently, no consensus exists regarding use of hemoglobin A1c for diagnosis of diabetes for children. .   . Mean Plasma Glucose 12/30/2017 151  (calc) Final  . eAG (mmol/L) 12/30/2017 8.4  (calc) Final  . Cholesterol 12/30/2017 105  <200  mg/dL Final  . HDL 81/19/1478 42  >40 mg/dL Final  . Triglycerides 12/30/2017 85  <150 mg/dL Final  . LDL Cholesterol (Calc) 12/30/2017 46  mg/dL (calc) Final   Comment: Reference range: <100 . Desirable range <100 mg/dL for primary prevention;   <70 mg/dL for patients with CHD or diabetic patients  with > or = 2 CHD risk factors. Marland Kitchen LDL-C is now calculated using the Martin-Hopkins  calculation, which is a validated novel method providing  better accuracy than the Friedewald equation in the  estimation of LDL-C.  Horald Pollen et al. Lenox Ahr. 2956;213(08): 2061-2068  (http://education.[employer].com/faq/FAQ164)   . Total CHOL/HDL Ratio  12/30/2017 2.5  <5.0 (calc) Final  . Non-HDL Cholesterol (Calc) 12/30/2017 63  <130 mg/dL (calc) Final   Comment: For patients with diabetes plus 1 major ASCVD risk  factor, treating to a non-HDL-C goal of <100 mg/dL  (LDL-C of <65 mg/dL) is considered a therapeutic  option.   Marland Kitchen PSA 12/30/2017 1.5  < OR = 4.0 ng/mL Final   Comment: The total PSA value from this assay system is  standardized against the WHO standard. The test  result will be approximately 20% lower when compared  to the equimolar-standardized total PSA (Beckman  Coulter). Comparison of serial PSA results should be  interpreted with this fact in mind. . This test was performed using the Siemens  chemiluminescent method. Values obtained from  different assay methods cannot be used interchangeably. PSA levels, regardless of value, should not be interpreted as absolute evidence of the presence or absence of disease.   . Glucose, Bld 12/30/2017 143* 65 - 99 mg/dL Final   Comment: .            Fasting reference interval . For someone without known diabetes, a glucose value >125 mg/dL indicates that they may have diabetes and this should be confirmed with a follow-up test. .   . BUN 12/30/2017 22  7 - 25 mg/dL Final  . Creat 78/46/9629 1.12  0.70 - 1.25 mg/dL Final   Comment: For patients >81 years of age, the reference limit for Creatinine is approximately 13% higher for people identified as African-American. .   Edwena Felty Ratio 12/30/2017 NOT APPLICABLE  6 - 22 (calc) Final  . Sodium 12/30/2017 141  135 - 146 mmol/L Final  . Potassium 12/30/2017 4.8  3.5 - 5.3 mmol/L Final  . Chloride 12/30/2017 104  98 - 110 mmol/L Final  . CO2 12/30/2017 30  20 - 32 mmol/L Final  . Calcium 12/30/2017 9.4  8.6 - 10.3 mg/dL Final  . Total Protein 12/30/2017 7.3  6.1 - 8.1 g/dL Final  . Albumin 52/84/1324 4.2  3.6 - 5.1 g/dL Final  . Globulin 40/09/2724 3.1  1.9 - 3.7 g/dL (calc) Final  . AG Ratio 12/30/2017 1.4  1.0 -  2.5 (calc) Final  . Total Bilirubin 12/30/2017 0.4  0.2 - 1.2 mg/dL Final  . Alkaline phosphatase (APISO) 12/30/2017 69  40 - 115 U/L Final  . AST 12/30/2017 26  10 - 35 U/L Final  . ALT 12/30/2017 36  9 - 46 U/L Final   Immunization record: Immunization History  Administered Date(s) Administered  . Influenza,inj,Quad PF,6+ Mos 10/21/2013, 09/28/2014, 12/27/2015, 09/27/2016, 10/01/2017  . Pneumococcal Conjugate-13 12/27/2015  . Pneumococcal Polysaccharide-23 12/17/2014  . Td 11/21/2012  . Tdap 11/21/2012   Still due for shingles vaccine.  Colonoscopy was completed 03/2017.  Due for diabetic eye exam, diabetic foot exam and hep c screening.  Past Medical History:  Diagnosis Date  . Coronary artery disease 2011    2 stents placed  . H/O hiatal hernia   . Hyperlipidemia    borderline; status post stress test x2, the last one 5-6 years ago and without significant abnormality per the patient  . Hypertension    for approximately 5 years  . Myocardial infarction (HCC) 2011  . Prediabetes    Past Surgical History:  Procedure Laterality Date  . CHOLECYSTECTOMY    . COLONOSCOPY    . CORONARY ANGIOPLASTY     DES to mid and distal RCA 05/15/10  . ROTATOR CUFF REPAIR Left 10/20/2013   Dr August Saucer  . SHOULDER ARTHROSCOPY WITH OPEN ROTATOR CUFF REPAIR Left 10/20/2013   Procedure: SHOULDER ARTHROSCOPY WITH OPEN ROTATOR CUFF REPAIR;  Surgeon: Cammy Copa, MD;  Location: [facility] OR;  Service: Orthopedics;  Laterality: Left;  Left shoulder arthroscopy, debridement, open biceps tenodesis and rotator cuff repair.   Current Outpatient Medications on File Prior to Visit  Medication Sig Dispense Refill  . Ascorbic Acid (VITAMIN C PO) Take 1 tablet daily by mouth.    Marland Kitchen aspirin EC 81 MG tablet Take 81 mg by mouth daily.    Marland Kitchen atorvastatin (LIPITOR) 40 MG tablet Take 1 tablet (40 mg total) daily by mouth. 90 tablet 3  . Cholecalciferol (VITAMIN D) 2000 UNITS tablet Take 2,000 Units by mouth 2 (two)  times daily.    . Cyanocobalamin (VITAMIN B-12 PO) Take 1 tablet daily by mouth.    . fexofenadine (ALLEGRA) 180 MG tablet Take 180 mg by mouth daily.    Marland Kitchen losartan (COZAAR) 50 MG tablet Take 1 tablet (50 mg total) by mouth daily. 90 tablet 3  . metoprolol tartrate (LOPRESSOR) 25 MG tablet Take 0.5 tablets (12.5 mg total) 2 (two) times daily by mouth. 90 tablet 3  . Multiple Vitamin (MULTIVITAMIN WITH MINERALS) TABS tablet Take 1 tablet by mouth daily.    . nitroGLYCERIN (NITROSTAT) 0.4 MG SL tablet Place 1 tablet (0.4 mg total) under the tongue every 5 (five) minutes as needed for chest pain. 25 tablet 4  . Omega-3 Fatty Acids (FISH OIL TRIPLE STRENGTH) 1400 MG CAPS Take 1,400 mg by mouth daily.    . ranitidine (ZANTAC) 75 MG tablet Take 75 mg by mouth every morning.      Current Facility-Administered Medications on File Prior to Visit  Medication Dose Route Frequency Provider Last Rate Last Dose  . 0.9 %  sodium chloride infusion  500 mL Intravenous Continuous Meryl Dare, MD       No Known Allergies Social History   Socioeconomic History  . Marital status: Married    Spouse name: Not on file  . Number of children: 2  . Years of education: Not on file  . Highest education level: Not on file  Social Needs  . Financial resource strain: Not on file  . Food insecurity - worry: Not on file  . Food insecurity - inability: Not on file  . Transportation needs - medical: Not on file  . Transportation needs - non-medical: Not on file  Occupational History  . Not on file  Tobacco Use  . Smoking status: Former Smoker    Types: Cigarettes    Last attempt to quit: 12/03/1968    Years since quitting: 49.1  . Smokeless tobacco: Never Used  Substance and Sexual Activity  . Alcohol use: No  . Drug use: No  . Sexual activity: Not on file  Other Topics Concern  . Not on file  Social History Narrative   Quit tobacco greater than 20 years ago with approximately 10 pack-year history. Denies  alcohol or drug abuse   Family History  Problem Relation Age of Onset  . Heart disease Father   . Heart attack Unknown   . Sudden death Paternal Uncle        possible MI  . Colon cancer Neg Hx      Review of Systems  All other systems reviewed and are negative.      Objective:   Physical Exam  Constitutional: He is oriented to person, place, and time. He appears well-developed and well-nourished. No distress.  HENT:  Head: Normocephalic and atraumatic.  Right Ear: External ear normal.  Left Ear: External ear normal.  Nose: Nose normal.  Mouth/Throat: Oropharynx is clear and moist. No oropharyngeal exudate.  Eyes: Conjunctivae and EOM are normal. Pupils are equal, round, and reactive to light. Right eye exhibits no discharge. Left eye exhibits no discharge. No scleral icterus.  Neck: Normal range of motion. Neck supple. No JVD present. No tracheal deviation present. No thyromegaly present.  Cardiovascular: Normal rate, regular rhythm, normal heart sounds and intact distal pulses. Exam reveals no gallop and no friction rub.  No murmur heard. Pulmonary/Chest: Effort normal and breath sounds normal. No stridor. No respiratory distress. He has no wheezes. He has no rales. He exhibits no tenderness.  Abdominal: Soft. Bowel sounds are normal. He exhibits no distension and no mass. There is no tenderness. There is no rebound and no guarding.  Genitourinary: Rectum normal, prostate normal and penis normal.  Musculoskeletal: Normal range of motion. He exhibits no edema or tenderness.  Lymphadenopathy:    He has no cervical adenopathy.  Neurological: He is alert and oriented to person, place, and time. He has normal reflexes. No cranial nerve deficit. He exhibits normal muscle tone. Coordination normal.  Skin: Skin is warm. He is not diaphoretic.  Psychiatric: He has a normal mood and affect. His behavior is normal. Judgment and thought content normal.  Vitals reviewed.           Assessment & Plan:  Encounter for hepatitis C screening test for low risk patient - Plan: Hepatitis C Antibody  Controlled type 2 diabetes mellitus without complication, without long-term current use of insulin (HCC) - Plan: Microalbumin, urine, Ambulatory referral to Ophthalmology  General medical exam  Benign essential HTN  Pure hypercholesterolemia  Schedule diabetic eye exam and patient will schedule with Dr. Hyacinth Meeker himself.   Diabetic foot exam is normal.  Check urine microalbumen.  Screen for hep c.  Recommended shingrix.  Cholesterol is acceptable.  Hemoglobin A1c is slightly elevated.  Recommended therapeutic lifestyle changes including diet and exercise.  Recheck labs in 6 months.

## 2018-01-04 LAB — MICROALBUMIN, URINE: Microalb, Ur: 4 mg/dL

## 2018-01-04 LAB — HEPATITIS C ANTIBODY
Hepatitis C Ab: NONREACTIVE
SIGNAL TO CUT-OFF: 0.08 (ref <1.00)

## 2018-01-06 ENCOUNTER — Encounter: Payer: Self-pay | Admitting: Family Medicine

## 2018-01-20 DIAGNOSIS — H52223 Regular astigmatism, bilateral: Secondary | ICD-10-CM | POA: Diagnosis not present

## 2018-01-20 DIAGNOSIS — H2513 Age-related nuclear cataract, bilateral: Secondary | ICD-10-CM | POA: Diagnosis not present

## 2018-01-20 DIAGNOSIS — H5203 Hypermetropia, bilateral: Secondary | ICD-10-CM | POA: Diagnosis not present

## 2018-01-20 DIAGNOSIS — H524 Presbyopia: Secondary | ICD-10-CM | POA: Diagnosis not present

## 2018-03-20 DIAGNOSIS — J111 Influenza due to unidentified influenza virus with other respiratory manifestations: Secondary | ICD-10-CM | POA: Diagnosis not present

## 2018-03-20 DIAGNOSIS — I10 Essential (primary) hypertension: Secondary | ICD-10-CM | POA: Diagnosis not present

## 2018-03-20 DIAGNOSIS — R509 Fever, unspecified: Secondary | ICD-10-CM | POA: Diagnosis not present

## 2018-05-13 ENCOUNTER — Other Ambulatory Visit: Payer: Self-pay | Admitting: Family Medicine

## 2018-05-19 ENCOUNTER — Ambulatory Visit (INDEPENDENT_AMBULATORY_CARE_PROVIDER_SITE_OTHER): Payer: Medicare Other | Admitting: Orthopedic Surgery

## 2018-05-19 ENCOUNTER — Ambulatory Visit (INDEPENDENT_AMBULATORY_CARE_PROVIDER_SITE_OTHER): Payer: Medicare Other

## 2018-05-19 ENCOUNTER — Encounter (INDEPENDENT_AMBULATORY_CARE_PROVIDER_SITE_OTHER): Payer: Self-pay | Admitting: Orthopedic Surgery

## 2018-05-19 DIAGNOSIS — M25511 Pain in right shoulder: Secondary | ICD-10-CM

## 2018-05-19 DIAGNOSIS — M79642 Pain in left hand: Secondary | ICD-10-CM | POA: Diagnosis not present

## 2018-05-19 DIAGNOSIS — G8929 Other chronic pain: Secondary | ICD-10-CM | POA: Diagnosis not present

## 2018-05-24 ENCOUNTER — Encounter (INDEPENDENT_AMBULATORY_CARE_PROVIDER_SITE_OTHER): Payer: Self-pay | Admitting: Orthopedic Surgery

## 2018-05-24 NOTE — Progress Notes (Signed)
Office Visit Note   Patient: Paul Rubio           Date of Birth: December 19, 1950           MRN: [id number] Visit Date: 05/19/2018 Requested by: Donita Brooks, MD [id number] [place], [place] [address] PCP: Donita Brooks, MD  Subjective: Chief Complaint  Patient presents with  . Right Shoulder - Pain  . Left Hand - Pain    HPI: Paul Rubio is a patient with multiple complaints today.  He reports left hand pain and numbness and tingling with left thumb locking.  He also describes right shoulder pain for several months.  He also reports bilateral leg cramping after prolonged sitting.  He also describes right toe deformity.  Going back to the left hand he describes left hand pain with numbness and tingling primarily in digits 4 and 5.  He also states that his left thumb locks about 2-3 times a day for several months.  He is right-hand dominant.  Denies any radicular pain in the arm and denies any neck pain.  He does report a little bit of loss of dexterity in that left hand.  Patient also describes right shoulder pain for several months duration.  He reports point tenderness at the anterior aspect of the shoulder.  He is right-hand dominant.  He does not really report much in the way of weakness.  No real radicular symptoms involving the right arm.  Does describe some crepitus in the shoulder but no severe functional limitations at this time.  He does have a history of left shoulder rotator cuff tear and biceps tenodesis done 5 years ago.  Patient also describes bilateral leg cramping after prolonged sitting.  This is been going on for several months.  Patient also describes right second toe deformity which is beginning to interfere with his shoe wear.              ROS: All systems reviewed are negative as they relate to the chief complaint within the history of present illness.  Patient denies  fevers or chills.   Assessment & Plan: Visit Diagnoses:  1. Chronic right shoulder  pain   2. Pain in left hand     Plan: Impression is left hand trigger thumb and possible Guyon's canal compression.  He is having some paresthesias but not on the dorsal aspect of the hand.  I think a nerve study is indicated to evaluate for carpal tunnel syndrome versus Guyon's canal compression.  We may also have to decide what he wants to do about the thumb locking.  Could consider injection or surgical treatment for that.  In regards to that right shoulder it does look like he has rotator cuff arthropathy but he is pretty asymptomatic at this time in terms of functional limitation.  He localizes point tenderness to the anterior aspect of the shoulder.  He does not have a Popeye deformity in the arm at this time but he may be getting ready to have one.  I think there is no intervention required at this time on the right shoulder.  In regards to bilateral leg cramping he has palpable pedal pulses and this may just be a dehydration issue or metabolic problem.  He should start with over-the-counter cramping dietary supplements to see if that will help.  This does not seem like neurogenic or vascular claudication.  In regards to the right toe deformity he does have a hammertoe type deformity  which could interfere with shoe wear if it gets worse.  This is a surgically correctable problem once he gets to that point.  Follow-Up Instructions: No follow-ups on file.   Orders:  Orders Placed This Encounter  Procedures  . XR Hand Complete Left  . XR Shoulder Right  . Ambulatory referral to Physical Medicine Rehab   No orders of the defined types were placed in this encounter.     Procedures: No procedures performed   Clinical Data: No additional findings.  Objective: Vital Signs: There were no vitals taken for this visit.  Physical Exam:   Constitutional: Patient appears well-developed HEENT:  Head: Normocephalic Eyes:EOM are normal Neck: Normal range of motion Cardiovascular:  Normal rate Pulmonary/chest: Effort normal Neurologic: Patient is alert Skin: Skin is warm Psychiatric: Patient has normal mood and affect    Ortho Exam: Ortho exam demonstrates negative Tinel's cubital tunnel in the left or right elbow with no subluxation of the ulnar nerve in this region.  Does have tenderness at the base of the A1 pulley on that left thumb.  EPL FPL interosseous strength is intact.  Radial pulses intact.  Wrist range of motion intact.  No other masses lymphadenopathy or skin changes noted in that left wrist or hand region.  Right shoulder demonstrates forward flexion and abduction both above 90 degrees.  Does have some tenderness to palpation along that anterior lateral aspect of the deltoid.  No Popeye deformity present.  Does have some weakness to infraspinatus and supraspinatus testing on the right compared to the left but subscap strength testing is intact.  He has palpable pedal pulses and no muscle atrophy in both legs.  No groin pain with internal/external rotation of the leg.  Good ankle dorsiflexion plantarflexion strength.  Patient also has hammertoe deformity of right second toe.  This is higher than the remaining toes and could be an interference with shoe wear.  It is a flexible deformity at this time.  Specialty Comments:  No specialty comments available.  Imaging: No results found.   PMFS History: Patient Active Problem List   Diagnosis Date Noted  . Prediabetes   . Acute sinusitis 02/10/2014  . Acute bronchitis 02/10/2014  . ESSENTIAL HYPERTENSION, BENIGN 07/25/2010  . Mixed hyperlipidemia 05/31/2010  . CAD, NATIVE VESSEL 05/31/2010  . FATIGUE / MALAISE 05/31/2010  . HYPERTENSION, HX OF 05/31/2010   Past Medical History:  Diagnosis Date  . Coronary artery disease 2011    2 stents placed  . H/O hiatal hernia   . Hyperlipidemia    borderline; status post stress test x2, the last one 5-6 years ago and without significant abnormality per the  patient  . Hypertension    for approximately 5 years  . Myocardial infarction (HCC) 2011  . Prediabetes     Family History  Problem Relation Age of Onset  . Heart disease Father   . Heart attack Unknown   . Sudden death Paternal Uncle        possible MI  . Colon cancer Neg Hx     Past Surgical History:  Procedure Laterality Date  . CHOLECYSTECTOMY    . COLONOSCOPY    . CORONARY ANGIOPLASTY     DES to mid and distal RCA 05/15/10  . ROTATOR CUFF REPAIR Left 10/20/2013   Dr August Saucerean  . SHOULDER ARTHROSCOPY WITH OPEN ROTATOR CUFF REPAIR Left 10/20/2013   Procedure: SHOULDER ARTHROSCOPY WITH OPEN ROTATOR CUFF REPAIR;  Surgeon: Cammy CopaGregory Scott Kayal Mula, MD;  Location: [facility] OR;  Service: Orthopedics;  Laterality: Left;  Left shoulder arthroscopy, debridement, open biceps tenodesis and rotator cuff repair.   Social History   Occupational History  . Not on file  Tobacco Use  . Smoking status: Former Smoker    Types: Cigarettes    Last attempt to quit: 12/03/1968    Years since quitting: 49.5  . Smokeless tobacco: Never Used  Substance and Sexual Activity  . Alcohol use: No  . Drug use: No  . Sexual activity: Not on file

## 2018-06-18 ENCOUNTER — Encounter (INDEPENDENT_AMBULATORY_CARE_PROVIDER_SITE_OTHER): Payer: Self-pay | Admitting: Physical Medicine and Rehabilitation

## 2018-06-27 ENCOUNTER — Other Ambulatory Visit: Payer: Self-pay | Admitting: Family Medicine

## 2018-06-27 ENCOUNTER — Other Ambulatory Visit: Payer: Medicare Other

## 2018-06-27 DIAGNOSIS — E7800 Pure hypercholesterolemia, unspecified: Secondary | ICD-10-CM

## 2018-06-27 DIAGNOSIS — I10 Essential (primary) hypertension: Secondary | ICD-10-CM

## 2018-06-27 DIAGNOSIS — E119 Type 2 diabetes mellitus without complications: Secondary | ICD-10-CM | POA: Diagnosis not present

## 2018-06-27 DIAGNOSIS — E782 Mixed hyperlipidemia: Secondary | ICD-10-CM | POA: Diagnosis not present

## 2018-06-28 LAB — COMPREHENSIVE METABOLIC PANEL
AG Ratio: 1.4 (calc) (ref 1.0–2.5)
ALT: 32 U/L (ref 9–46)
AST: 26 U/L (ref 10–35)
Albumin: 4.1 g/dL (ref 3.6–5.1)
Alkaline phosphatase (APISO): 65 U/L (ref 40–115)
BUN/Creatinine Ratio: 30 (calc) — ABNORMAL HIGH (ref 6–22)
BUN: 34 mg/dL — ABNORMAL HIGH (ref 7–25)
CO2: 28 mmol/L (ref 20–32)
Calcium: 9.5 mg/dL (ref 8.6–10.3)
Chloride: 106 mmol/L (ref 98–110)
Creat: 1.13 mg/dL (ref 0.70–1.25)
Globulin: 3 g/dL (calc) (ref 1.9–3.7)
Glucose, Bld: 113 mg/dL — ABNORMAL HIGH (ref 65–99)
Potassium: 4.7 mmol/L (ref 3.5–5.3)
Sodium: 141 mmol/L (ref 135–146)
Total Bilirubin: 0.4 mg/dL (ref 0.2–1.2)
Total Protein: 7.1 g/dL (ref 6.1–8.1)

## 2018-06-28 LAB — CBC WITH DIFFERENTIAL/PLATELET
Basophils Absolute: 32 cells/uL (ref 0–200)
Basophils Relative: 0.4 %
Eosinophils Absolute: 119 cells/uL (ref 15–500)
Eosinophils Relative: 1.5 %
HCT: 43.1 % (ref 38.5–50.0)
Hemoglobin: 14.6 g/dL (ref 13.2–17.1)
Lymphs Abs: 2884 cells/uL (ref 850–3900)
MCH: 30.4 pg (ref 27.0–33.0)
MCHC: 33.9 g/dL (ref 32.0–36.0)
MCV: 89.8 fL (ref 80.0–100.0)
MPV: 11.2 fL (ref 7.5–12.5)
Monocytes Relative: 6.9 %
Neutro Abs: 4321 cells/uL (ref 1500–7800)
Neutrophils Relative %: 54.7 %
Platelets: 243 10*3/uL (ref 140–400)
RBC: 4.8 10*6/uL (ref 4.20–5.80)
RDW: 12.3 % (ref 11.0–15.0)
Total Lymphocyte: 36.5 %
WBC mixed population: 545 cells/uL (ref 200–950)
WBC: 7.9 10*3/uL (ref 3.8–10.8)

## 2018-06-28 LAB — HEMOGLOBIN A1C
Hgb A1c MFr Bld: 6.7 % of total Hgb — ABNORMAL HIGH (ref <5.7)
Mean Plasma Glucose: 146 (calc)
eAG (mmol/L): 8.1 (calc)

## 2018-06-28 LAB — LIPID PANEL
Cholesterol: 96 mg/dL (ref <200)
HDL: 45 mg/dL (ref >40)
LDL Cholesterol (Calc): 37 mg/dL (calc)
Non-HDL Cholesterol (Calc): 51 mg/dL (calc) (ref <130)
Total CHOL/HDL Ratio: 2.1 (calc) (ref <5.0)
Triglycerides: 48 mg/dL (ref <150)

## 2018-06-30 ENCOUNTER — Encounter (INDEPENDENT_AMBULATORY_CARE_PROVIDER_SITE_OTHER): Payer: Self-pay

## 2018-07-22 ENCOUNTER — Encounter (INDEPENDENT_AMBULATORY_CARE_PROVIDER_SITE_OTHER): Payer: Self-pay | Admitting: Physical Medicine and Rehabilitation

## 2018-08-07 ENCOUNTER — Telehealth (INDEPENDENT_AMBULATORY_CARE_PROVIDER_SITE_OTHER): Payer: Self-pay | Admitting: Orthopedic Surgery

## 2018-08-07 DIAGNOSIS — M25511 Pain in right shoulder: Principal | ICD-10-CM

## 2018-08-07 DIAGNOSIS — G8929 Other chronic pain: Secondary | ICD-10-CM

## 2018-08-07 NOTE — Telephone Encounter (Signed)
Order submitted. IC s/w patients wife and advised would be contacted to get appt scheduled for scan.

## 2018-08-07 NOTE — Telephone Encounter (Signed)
Patient's wife [name]) called asked if patient can get set up for an MRI for his right shoulder. The number to contact Magda Kiel is [phone]

## 2018-08-07 NOTE — Telephone Encounter (Signed)
Ok to proceed with MRI arthrogram of right shoulder?  Not mentioned in plan of last OV note.

## 2018-08-07 NOTE — Telephone Encounter (Signed)
Ok for mri      arthrogram not needed

## 2018-08-11 ENCOUNTER — Other Ambulatory Visit: Payer: Self-pay | Admitting: Cardiovascular Disease

## 2018-08-11 DIAGNOSIS — I2510 Atherosclerotic heart disease of native coronary artery without angina pectoris: Secondary | ICD-10-CM

## 2018-08-13 ENCOUNTER — Encounter (INDEPENDENT_AMBULATORY_CARE_PROVIDER_SITE_OTHER): Payer: Self-pay | Admitting: Physical Medicine and Rehabilitation

## 2018-08-13 ENCOUNTER — Ambulatory Visit (INDEPENDENT_AMBULATORY_CARE_PROVIDER_SITE_OTHER): Payer: Medicare Other | Admitting: Physical Medicine and Rehabilitation

## 2018-08-13 DIAGNOSIS — R202 Paresthesia of skin: Secondary | ICD-10-CM

## 2018-08-13 NOTE — Progress Notes (Signed)
Paul Rubio - 67 y.o. male MRN [id number]  Date of birth: Feb 25, 1951  Office Visit Note: Visit Date: 08/13/2018 PCP: Donita Brooks, MD Referred by: Donita Brooks, MD  Subjective: Chief Complaint  Patient presents with  . Left Hand - Tingling, Pain   HPI: Paul Rubio is a 67 year old right-hand-dominant gentleman referred here by Dr. Burnard Bunting for electrodiagnostic study of the left upper limb.  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  He has been followed by Dr. August Saucer for general orthopedic complaints but mainly chronic right and left shoulder pai  Has had prior left arthroscopy and shoulder surgery by Dr. August Saucer in the past.  He reports 2 months of worsening hand pain with numbness and tingling particularly in the fourth and fifth digit as well as the thumb.  He reports that the thumb will twitch at times and then it will "draw down" across the palm.  He reports that the symptoms will happen randomly.  He does seem to get some nocturnal complaints.  He gets no radicular symptoms down the arm.  He has had no new trauma.  No right-sided complaints.  He is a prediabetic.  His hemoglobin A1c's have run in the high sixes.  He is not on medication.  He has had has had remote prior electrodiagnostic study at some point.  We do not have those notes to review but he states that he was told he had severe carpal tunnel syndrome at that time and that was many years ago.   ROS Otherwise per HPI.  Assessment & Plan: Visit Diagnoses:  1. Paresthesia of skin     Plan: Impression: The above electrodiagnostic study is ABNORMAL and reveals evidence of a moderate to severe left median nerve entrapment at the wrist (carpal tunnel syndrome) affecting sensory and motor components.  This does not explain the totality of his symptoms including symptoms in the fifth digit.  I did not see a Martin-Gruber anastomosis or other innervation variant.  This would not rule out some type  of variant.  There is no significant electrodiagnostic evidence of any other focal nerve entrapment, brachial plexopathy or cervical radiculopathy.   Recommendations: 1.  Follow-up with referring physician. 2.  Continue current management of symptoms. 3.  Suggest surgical evaluation.   Meds & Orders: No orders of the defined types were placed in this encounter.   Orders Placed This Encounter  Procedures  . NCV with EMG (electromyography)    Follow-up: Return for  Burnard Bunting, M.D..   Procedures: No procedures performed  EMG & NCV Findings: Evaluation of the left median motor nerve showed prolonged distal onset latency (4.8 ms), reduced amplitude (4.4 mV), and decreased conduction velocity (Elbow-Wrist, 43 m/s).  The left median (across palm) sensory nerve showed prolonged distal peak latency (Wrist, 4.4 ms) and prolonged distal peak latency (Palm, 2.2 ms).  All remaining nerves (as indicated in the following tables) were within normal limits.    All examined muscles (as indicated in the following table) showed no evidence of electrical instability.    Impression: The above electrodiagnostic study is ABNORMAL and reveals evidence of a moderate to severe left median nerve entrapment at the wrist (carpal tunnel syndrome) affecting sensory and motor components.  This does not explain the totality of his symptoms including symptoms in the fifth digit.  I did not see a Martin-Gruber anastomosis or other innervation variant.  This would not rule out  some type of variant.  There is no significant electrodiagnostic evidence of any other focal nerve entrapment, brachial plexopathy or cervical radiculopathy.   Recommendations: 1.  Follow-up with referring physician. 2.  Continue current management of symptoms. 3.  Suggest surgical evaluation.  ___________________________ Naaman Plummer FAAPMR Board Certified, American Board of Physical Medicine and Rehabilitation    Nerve Conduction  Studies Anti Sensory Summary Table   Stim Site NR Peak (ms) Norm Peak (ms) P-T Amp (V) Norm P-T Amp Site1 Site2 Delta-P (ms) Dist (cm) Vel (m/s) Norm Vel (m/s)  Left Median Acr Palm Anti Sensory (2nd Digit)  32.4C  Wrist    *4.4 <3.6 19.5 >10 Wrist Palm 2.2 0.0    Palm    *2.2 <2.0 5.8         Left Radial Anti Sensory (Base 1st Digit)  32.4C  Wrist    2.3 <3.1 17.0  Wrist Base 1st Digit 2.3 0.0    Left Ulnar Anti Sensory (5th Digit)  32.7C  Wrist    3.5 <3.7 17.4 >15.0 Wrist 5th Digit 3.5 14.0 40 >38   Motor Summary Table   Stim Site NR Onset (ms) Norm Onset (ms) O-P Amp (mV) Norm O-P Amp Site1 Site2 Delta-0 (ms) Dist (cm) Vel (m/s) Norm Vel (m/s)  Left Median Motor (Abd Poll Brev)  32.8C  Wrist    *4.8 <4.2 *4.4 >5 Elbow Wrist 5.2 22.5 *43 >50  Elbow    10.0  2.6         Left Ulnar Motor (Abd Dig Min)  32.8C  Wrist    3.4 <4.2 7.2 >3 B Elbow Wrist 4.0 23.0 58 >53  B Elbow    7.4  4.9  A Elbow B Elbow 1.5 9.0 60 >53  A Elbow    8.9  5.3          EMG   Side Muscle Nerve Root Ins Act Fibs Psw Amp Dur Poly Recrt Int Dennie Bible Comment  Left Abd Poll Brev Median C8-T1 Nml Nml Nml Nml Nml 0 Nml Nml   Left 1stDorInt Ulnar C8-T1 Nml Nml Nml Nml Nml 0 Nml Nml   Left PronatorTeres Median C6-7 Nml Nml Nml Nml Nml 0 Nml Nml   Left Biceps Musculocut C5-6 Nml Nml Nml Nml Nml 0 Nml Nml   Left Deltoid Axillary C5-6 Nml Nml Nml Nml Nml 0 Nml Nml     Nerve Conduction Studies Anti Sensory Left/Right Comparison   Stim Site L Lat (ms) R Lat (ms) L-R Lat (ms) L Amp (V) R Amp (V) L-R Amp (%) Site1 Site2 L Vel (m/s) R Vel (m/s) L-R Vel (m/s)  Median Acr Palm Anti Sensory (2nd Digit)  32.4C  Wrist *4.4   19.5   Wrist Palm     Palm *2.2   5.8         Radial Anti Sensory (Base 1st Digit)  32.4C  Wrist 2.3   17.0   Wrist Base 1st Digit     Ulnar Anti Sensory (5th Digit)  32.7C  Wrist 3.5   17.4   Wrist 5th Digit 40     Motor Left/Right Comparison   Stim Site L Lat (ms) R Lat (ms) L-R Lat (ms)  L Amp (mV) R Amp (mV) L-R Amp (%) Site1 Site2 L Vel (m/s) R Vel (m/s) L-R Vel (m/s)  Median Motor (Abd Poll Brev)  32.8C  Wrist *4.8   *4.4   Elbow Wrist *43    Elbow 10.0  2.6         Ulnar Motor (Abd Dig Min)  32.8C  Wrist 3.4   7.2   B Elbow Wrist 58    B Elbow 7.4   4.9   A Elbow B Elbow 60    A Elbow 8.9   5.3            Waveforms:            Clinical History: No specialty comments available.   He reports that he quit smoking about 49 years ago. His smoking use included cigarettes. He has never used smokeless tobacco.  Recent Labs    12/30/17 1057 06/27/18 1039  HGBA1C 6.9* 6.7*    Objective:  VS:  HT:    WT:   BMI:     BP:   HR: bpm  TEMP: ( )  RESP:  Physical Exam  Musculoskeletal:  Inspection reveals no atrophy of the bilateral APB or FDI or hand intrinsics. There is no swelling, color changes, allodynia or dystrophic changes. There is 5 out of 5 strength in the bilateral wrist extension, finger abduction and long finger flexion. There is intact sensation to light touch in all dermatomal and peripheral nerve distributions. There is a negative Froment's test bilaterally. There is a negative Tinel's test at the bilateral wrist and elbow.  There is a negative Hoffmann's test bilaterally.    Ortho Exam Imaging: No results found.  Past Medical/Family/Surgical/Social History: Medications & Allergies reviewed per EMR, new medications updated. Patient Active Problem List   Diagnosis Date Noted  . Prediabetes   . Acute sinusitis 02/10/2014  . Acute bronchitis 02/10/2014  . ESSENTIAL HYPERTENSION, BENIGN 07/25/2010  . Mixed hyperlipidemia 05/31/2010  . CAD, NATIVE VESSEL 05/31/2010  . FATIGUE / MALAISE 05/31/2010  . HYPERTENSION, HX OF 05/31/2010   Past Medical History:  Diagnosis Date  . Coronary artery disease 2011    2 stents placed  . H/O hiatal hernia   . Hyperlipidemia    borderline; status post stress test x2, the last one 5-6 years ago and  without significant abnormality per the patient  . Hypertension    for approximately 5 years  . Myocardial infarction (HCC) 2011  . Prediabetes    Family History  Problem Relation Age of Onset  . Heart disease Father   . Heart attack Unknown   . Sudden death Paternal Uncle        possible MI  . Colon cancer Neg Hx    Past Surgical History:  Procedure Laterality Date  . CHOLECYSTECTOMY    . COLONOSCOPY    . CORONARY ANGIOPLASTY     DES to mid and distal RCA 05/15/10  . ROTATOR CUFF REPAIR Left 10/20/2013   Dr August Saucer  . SHOULDER ARTHROSCOPY WITH OPEN ROTATOR CUFF REPAIR Left 10/20/2013   Procedure: SHOULDER ARTHROSCOPY WITH OPEN ROTATOR CUFF REPAIR;  Surgeon: Cammy Copa, MD;  Location: [facility] OR;  Service: Orthopedics;  Laterality: Left;  Left shoulder arthroscopy, debridement, open biceps tenodesis and rotator cuff repair.   Social History   Occupational History  . Not on file  Tobacco Use  . Smoking status: Former Smoker    Types: Cigarettes    Last attempt to quit: 12/03/1968    Years since quitting: 49.7  . Smokeless tobacco: Never Used  Substance and Sexual Activity  . Alcohol use: No  . Drug use: No  . Sexual activity: Not on file

## 2018-08-13 NOTE — Progress Notes (Signed)
  Numeric Pain Rating Scale and Functional Assessment Average Pain 5   In the last MONTH (on 0-10 scale) has pain interfered with the following?  1. General activity like being  able to carry out your everyday physical activities such as walking, climbing stairs, carrying groceries, or moving a chair?  Rating(2)     

## 2018-08-14 NOTE — Procedures (Signed)
EMG & NCV Findings: Evaluation of the left median motor nerve showed prolonged distal onset latency (4.8 ms), reduced amplitude (4.4 mV), and decreased conduction velocity (Elbow-Wrist, 43 m/s).  The left median (across palm) sensory nerve showed prolonged distal peak latency (Wrist, 4.4 ms) and prolonged distal peak latency (Palm, 2.2 ms).  All remaining nerves (as indicated in the following tables) were within normal limits.    All examined muscles (as indicated in the following table) showed no evidence of electrical instability.    Impression: The above electrodiagnostic study is ABNORMAL and reveals evidence of a moderate to severe left median nerve entrapment at the wrist (carpal tunnel syndrome) affecting sensory and motor components.  This does not explain the totality of his symptoms including symptoms in the fifth digit.  I did not see a Martin-Gruber anastomosis or other innervation variant.  This would not rule out some type of variant.  There is no significant electrodiagnostic evidence of any other focal nerve entrapment, brachial plexopathy or cervical radiculopathy.   Recommendations: 1.  Follow-up with referring physician. 2.  Continue current management of symptoms. 3.  Suggest surgical evaluation.  ___________________________ Naaman PlummerFred Euva Rundell FAAPMR Board Certified, American Board of Physical Medicine and Rehabilitation    Nerve Conduction Studies Anti Sensory Summary Table   Stim Site NR Peak (ms) Norm Peak (ms) P-T Amp (V) Norm P-T Amp Site1 Site2 Delta-P (ms) Dist (cm) Vel (m/s) Norm Vel (m/s)  Left Median Acr Palm Anti Sensory (2nd Digit)  32.4C  Wrist    *4.4 <3.6 19.5 >10 Wrist Palm 2.2 0.0    Palm    *2.2 <2.0 5.8         Left Radial Anti Sensory (Base 1st Digit)  32.4C  Wrist    2.3 <3.1 17.0  Wrist Base 1st Digit 2.3 0.0    Left Ulnar Anti Sensory (5th Digit)  32.7C  Wrist    3.5 <3.7 17.4 >15.0 Wrist 5th Digit 3.5 14.0 40 >38   Motor Summary Table   Stim  Site NR Onset (ms) Norm Onset (ms) O-P Amp (mV) Norm O-P Amp Site1 Site2 Delta-0 (ms) Dist (cm) Vel (m/s) Norm Vel (m/s)  Left Median Motor (Abd Poll Brev)  32.8C  Wrist    *4.8 <4.2 *4.4 >5 Elbow Wrist 5.2 22.5 *43 >50  Elbow    10.0  2.6         Left Ulnar Motor (Abd Dig Min)  32.8C  Wrist    3.4 <4.2 7.2 >3 B Elbow Wrist 4.0 23.0 58 >53  B Elbow    7.4  4.9  A Elbow B Elbow 1.5 9.0 60 >53  A Elbow    8.9  5.3          EMG   Side Muscle Nerve Root Ins Act Fibs Psw Amp Dur Poly Recrt Int Dennie BiblePat Comment  Left Abd Poll Brev Median C8-T1 Nml Nml Nml Nml Nml 0 Nml Nml   Left 1stDorInt Ulnar C8-T1 Nml Nml Nml Nml Nml 0 Nml Nml   Left PronatorTeres Median C6-7 Nml Nml Nml Nml Nml 0 Nml Nml   Left Biceps Musculocut C5-6 Nml Nml Nml Nml Nml 0 Nml Nml   Left Deltoid Axillary C5-6 Nml Nml Nml Nml Nml 0 Nml Nml     Nerve Conduction Studies Anti Sensory Left/Right Comparison   Stim Site L Lat (ms) R Lat (ms) L-R Lat (ms) L Amp (V) R Amp (V) L-R Amp (%) Site1 Site2 L Vel (m/s) R  Vel (m/s) L-R Vel (m/s)  Median Acr Palm Anti Sensory (2nd Digit)  32.4C  Wrist *4.4   19.5   Wrist Palm     Palm *2.2   5.8         Radial Anti Sensory (Base 1st Digit)  32.4C  Wrist 2.3   17.0   Wrist Base 1st Digit     Ulnar Anti Sensory (5th Digit)  32.7C  Wrist 3.5   17.4   Wrist 5th Digit 40     Motor Left/Right Comparison   Stim Site L Lat (ms) R Lat (ms) L-R Lat (ms) L Amp (mV) R Amp (mV) L-R Amp (%) Site1 Site2 L Vel (m/s) R Vel (m/s) L-R Vel (m/s)  Median Motor (Abd Poll Brev)  32.8C  Wrist *4.8   *4.4   Elbow Wrist *43    Elbow 10.0   2.6         Ulnar Motor (Abd Dig Min)  32.8C  Wrist 3.4   7.2   B Elbow Wrist 58    B Elbow 7.4   4.9   A Elbow B Elbow 60    A Elbow 8.9   5.3            Waveforms:

## 2018-08-24 ENCOUNTER — Ambulatory Visit
Admission: RE | Admit: 2018-08-24 | Discharge: 2018-08-24 | Disposition: A | Discharge status: Home / Self Care | Payer: Medicare Other | Source: Ambulatory Visit | Attending: Orthopedic Surgery | Admitting: Orthopedic Surgery

## 2018-08-24 DIAGNOSIS — M25511 Pain in right shoulder: Secondary | ICD-10-CM | POA: Diagnosis not present

## 2018-08-24 DIAGNOSIS — G8929 Other chronic pain: Secondary | ICD-10-CM

## 2018-08-25 ENCOUNTER — Telehealth: Payer: Self-pay | Admitting: Family Medicine

## 2018-08-25 NOTE — Telephone Encounter (Signed)
Pt's wife called to inquire about the Zantac on 08/22/18. Called back today and informed to stop taking the medication and could see how he does off the medication vs taking Prilosec OTC vs RX PPI. LMOVM to call back if wanted RX or had any further questions.

## 2018-08-27 ENCOUNTER — Encounter (INDEPENDENT_AMBULATORY_CARE_PROVIDER_SITE_OTHER): Payer: Self-pay | Admitting: Orthopedic Surgery

## 2018-08-27 ENCOUNTER — Telehealth (INDEPENDENT_AMBULATORY_CARE_PROVIDER_SITE_OTHER): Payer: Self-pay | Admitting: Orthopedic Surgery

## 2018-08-27 ENCOUNTER — Ambulatory Visit (INDEPENDENT_AMBULATORY_CARE_PROVIDER_SITE_OTHER): Payer: Medicare Other | Admitting: Orthopedic Surgery

## 2018-08-27 DIAGNOSIS — G5602 Carpal tunnel syndrome, left upper limb: Secondary | ICD-10-CM

## 2018-08-27 NOTE — Telephone Encounter (Signed)
Patient is scheduled for LEFT [facility]EL [facility]  Patient's wife [name]) requested that I provide you with the information below:    Paul Rubio 1950-12-30  Anti-Inflammatory Pain Cream  Diclofenac 3%  Baclofen 2%  Cyclobenzaprine 2%  Lidocaine 2%    [facility]  [address]  [place], [place] [address]  [phone]   Thank you for your help  [name]

## 2018-08-27 NOTE — Telephone Encounter (Signed)
FYI

## 2018-08-27 NOTE — Progress Notes (Signed)
Office Visit Note   Patient: Paul Rubio           Date of Birth: [birth year]           MRN: [id number] Visit Date: 08/27/2018 Requested by: Donita Brooks, MD [address] [place], [place] [address] PCP: Donita Brooks, MD  Subjective: Chief Complaint  Patient presents with  . Follow-up    EMG/NCV review    HPI: Paul Rubio is a patient with left hand pain and numbness and tingling.  He also has right shoulder pain.  Since of seen him he had an EMG nerve study of that left wrist which shows moderate to severe carpal tunnel syndrome.  He describes tingling in digits 4 and 5 but does not have any elbow symptoms.  He states that his thumb jaws at times and he also has some pain at night in that hand.  He also reports right shoulder pain.  MRI scan on that shoulder shows a torn and retracted supraspinatus tendon with partially torn and retracted infraspinatus tendon with biceps tendinosis and severe subscap tendinosis.  He is a Engineer, maintenance.  Describes some symptoms with supination.  Compounding cream does help.  He will call me with the specifics on that so I can prescribe it for him.              ROS: All systems reviewed are negative as they relate to the chief complaint within the history of present illness.  Patient denies  fevers or chills.   Assessment & Plan: Visit Diagnoses:  1. Carpal tunnel syndrome, left upper limb     Plan: Impression is left carpal tunnel syndrome refractory to nonoperative management.  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  I think carpal tunnel release would be helpful for him.  He would need to be careful with any type of physical work that he does for the first 4 to 6 weeks after carpal tunnel release.  Other risks include but not limited to infection nerve vessel damage all discussed.  All questions answered.  In regards to that right shoulder I think that we will try compounding cream.  He uses some from his wife and that helps.  I think that  arthroscopy and biceps tenotomy could be helpful for him as a partial pain reliever.  Other than that he has excellent function with the shoulder and I would not recommend any type of arthroplasty procedure yet.  Follow-Up Instructions: No follow-ups on file.   Orders:  No orders of the defined types were placed in this encounter.  No orders of the defined types were placed in this encounter.     Procedures: No procedures performed   Clinical Data: No additional findings.  Objective: Vital Signs: There were no vitals taken for this visit.  Physical Exam:   Constitutional: Patient appears well-developed HEENT:  Head: Normocephalic Eyes:EOM are normal Neck: Normal range of motion Cardiovascular: Normal rate Pulmonary/chest: Effort normal Neurologic: Patient is alert Skin: Skin is warm Psychiatric: Patient has normal mood and affect    Ortho Exam: Ortho exam demonstrates negative Tinel's cubital tunnel in the left elbow.  He has good abductor pollicis brevis strength.  Extensor mechanism is intact.  He has good grip strength.  Abductor pollicis brevis is functional.  No other masses lymph adenopathy or skin changes noted in that left hand region.  Right shoulder demonstrates forward flexion and abduction both well above 90 degrees.  Not much  in the way of real discernible significant cuff weakness to infraspinatus supraspinatus and subscap muscle testing.  He does have tenderness in the biceps tendon sheath anteriorly on the right.  Specialty Comments:  No specialty comments available.  Imaging: No results found.   PMFS History: Patient Active Problem List   Diagnosis Date Noted  . Prediabetes   . Acute sinusitis 02/10/2014  . Acute bronchitis 02/10/2014  . ESSENTIAL HYPERTENSION, BENIGN 07/25/2010  . Mixed hyperlipidemia 05/31/2010  . CAD, NATIVE VESSEL 05/31/2010  . FATIGUE / MALAISE 05/31/2010  . HYPERTENSION, HX OF 05/31/2010   Past Medical History:    Diagnosis Date  . Coronary artery disease 2011    2 stents placed  . H/O hiatal hernia   . Hyperlipidemia    borderline; status post stress test x2, the last one 5-6 years ago and without significant abnormality per the patient  . Hypertension    for approximately 5 years  . Myocardial infarction (HCC) 2011  . Prediabetes     Family History  Problem Relation Age of Onset  . Heart disease Father   . Heart attack Unknown   . Sudden death Paternal Uncle        possible MI  . Colon cancer Neg Hx     Past Surgical History:  Procedure Laterality Date  . CHOLECYSTECTOMY    . COLONOSCOPY    . CORONARY ANGIOPLASTY     DES to mid and distal RCA 05/15/10  . ROTATOR CUFF REPAIR Left 10/20/2013   Dr August Saucer  . SHOULDER ARTHROSCOPY WITH OPEN ROTATOR CUFF REPAIR Left 10/20/2013   Procedure: SHOULDER ARTHROSCOPY WITH OPEN ROTATOR CUFF REPAIR;  Surgeon: Cammy Copa, MD;  Location: [facility] OR;  Service: Orthopedics;  Laterality: Left;  Left shoulder arthroscopy, debridement, open biceps tenodesis and rotator cuff repair.   Social History   Occupational History  . Not on file  Tobacco Use  . Smoking status: Former Smoker    Types: Cigarettes    Last attempt to quit: 12/03/1968    Years since quitting: 49.7  . Smokeless tobacco: Never Used  Substance and Sexual Activity  . Alcohol use: No  . Drug use: No  . Sexual activity: Not on file

## 2018-08-28 ENCOUNTER — Ambulatory Visit (INDEPENDENT_AMBULATORY_CARE_PROVIDER_SITE_OTHER): Payer: Medicare Other | Admitting: Surgery

## 2018-08-28 ENCOUNTER — Telehealth (INDEPENDENT_AMBULATORY_CARE_PROVIDER_SITE_OTHER): Payer: Self-pay | Admitting: Orthopedic Surgery

## 2018-08-28 NOTE — Telephone Encounter (Signed)
Patients wife would like to know if patient should stop taking 81 mg aspirin before his surgery on Monday 9/30. Please advise [phone]

## 2018-08-28 NOTE — Telephone Encounter (Signed)
I called rx into pharmacy. Patient advised done.

## 2018-08-28 NOTE — Telephone Encounter (Signed)
Can you call in a prescription for this for that patient for his shoulder thank you to that pharmacy.  Thanks

## 2018-08-29 NOTE — Telephone Encounter (Signed)
Please advise. Thanks.  

## 2018-08-29 NOTE — Telephone Encounter (Signed)
y

## 2018-08-29 NOTE — Telephone Encounter (Signed)
IC advised.  

## 2018-09-01 DIAGNOSIS — G5602 Carpal tunnel syndrome, left upper limb: Secondary | ICD-10-CM | POA: Diagnosis not present

## 2018-09-05 NOTE — Telephone Encounter (Signed)
Patients wife called and said prescription was never received, she wants to give you [facility]'s fax # [phone].  Kay's number # [phone]

## 2018-09-05 NOTE — Telephone Encounter (Signed)
IC s/w patient wife advised that I had called in 9 days ago. I called pharmacy also and verified. They have received rx but did not fill because they were unable to reach patient. I advised patients wife to call the pharmacy.

## 2018-09-08 ENCOUNTER — Ambulatory Visit (INDEPENDENT_AMBULATORY_CARE_PROVIDER_SITE_OTHER): Payer: Medicare Other | Admitting: Orthopedic Surgery

## 2018-09-08 ENCOUNTER — Encounter (INDEPENDENT_AMBULATORY_CARE_PROVIDER_SITE_OTHER): Payer: Self-pay | Admitting: Orthopedic Surgery

## 2018-09-08 DIAGNOSIS — G5602 Carpal tunnel syndrome, left upper limb: Secondary | ICD-10-CM

## 2018-09-10 ENCOUNTER — Encounter (INDEPENDENT_AMBULATORY_CARE_PROVIDER_SITE_OTHER): Payer: Self-pay | Admitting: Orthopedic Surgery

## 2018-09-10 NOTE — Progress Notes (Signed)
   Post-Op Visit Note   Patient: Paul Rubio           Date of Birth: Aug 31, 1951           MRN: [id number] Visit Date: 09/08/2018 PCP: Donita Brooks, MD   Assessment & Plan:  Chief Complaint:  Chief Complaint  Patient presents with  . Left Wrist - Routine Post Op   Visit Diagnoses:  1. Carpal tunnel syndrome, left upper limb     Plan: Duffy is a patient is now a week out left carpal tunnel surgery.  He is been doing well.  On exam the incision is intact but the sutures are quite ready to come out yet.  Come back Thursday for suture removal.  Soft dressing applied today.  Follow-Up Instructions: No follow-ups on file.   Orders:  No orders of the defined types were placed in this encounter.  No orders of the defined types were placed in this encounter.   Imaging: No results found.  PMFS History: Patient Active Problem List   Diagnosis Date Noted  . Prediabetes   . Acute sinusitis 02/10/2014  . Acute bronchitis 02/10/2014  . ESSENTIAL HYPERTENSION, BENIGN 07/25/2010  . Mixed hyperlipidemia 05/31/2010  . CAD, NATIVE VESSEL 05/31/2010  . FATIGUE / MALAISE 05/31/2010  . HYPERTENSION, HX OF 05/31/2010   Past Medical History:  Diagnosis Date  . Coronary artery disease 2011    2 stents placed  . H/O hiatal hernia   . Hyperlipidemia    borderline; status post stress test x2, the last one 5-6 years ago and without significant abnormality per the patient  . Hypertension    for approximately 5 years  . Myocardial infarction (HCC) 2011  . Prediabetes     Family History  Problem Relation Age of Onset  . Heart disease Father   . Heart attack Unknown   . Sudden death Paternal Uncle        possible MI  . Colon cancer Neg Hx     Past Surgical History:  Procedure Laterality Date  . CHOLECYSTECTOMY    . COLONOSCOPY    . CORONARY ANGIOPLASTY     DES to mid and distal RCA 05/15/10  . ROTATOR CUFF REPAIR Left 10/20/2013   Dr August Saucer  . SHOULDER ARTHROSCOPY WITH  OPEN ROTATOR CUFF REPAIR Left 10/20/2013   Procedure: SHOULDER ARTHROSCOPY WITH OPEN ROTATOR CUFF REPAIR;  Surgeon: Cammy Copa, MD;  Location: [facility] OR;  Service: Orthopedics;  Laterality: Left;  Left shoulder arthroscopy, debridement, open biceps tenodesis and rotator cuff repair.   Social History   Occupational History  . Not on file  Tobacco Use  . Smoking status: Former Smoker    Types: Cigarettes    Last attempt to quit: 12/03/1968    Years since quitting: 49.8  . Smokeless tobacco: Never Used  Substance and Sexual Activity  . Alcohol use: No  . Drug use: No  . Sexual activity: Not on file

## 2018-09-11 ENCOUNTER — Encounter (INDEPENDENT_AMBULATORY_CARE_PROVIDER_SITE_OTHER): Payer: Self-pay | Admitting: Orthopedic Surgery

## 2018-09-11 ENCOUNTER — Ambulatory Visit (INDEPENDENT_AMBULATORY_CARE_PROVIDER_SITE_OTHER): Payer: Medicare Other | Admitting: Orthopedic Surgery

## 2018-09-11 DIAGNOSIS — G5602 Carpal tunnel syndrome, left upper limb: Secondary | ICD-10-CM

## 2018-09-11 NOTE — Progress Notes (Signed)
   Post-Op Visit Note   Patient: Paul Rubio           Date of Birth: 02/28/1951           MRN: [id number] Visit Date: 09/11/2018 PCP: Paul Brooks, MD   Assessment & Plan:  Chief Complaint:  Chief Complaint  Patient presents with  . Left Hand - Routine Post Op   Visit Diagnoses:  1. Carpal tunnel syndrome, left upper limb     Plan: Paul Rubio is a patient left carpal tunnel release.  Sutures removed today.  Incision intact.  Avoid a lot of heavy work and I will see him back for final check in 4 weeks.  Follow-Up Instructions: Return in about 4 weeks (around 10/09/2018).   Orders:  No orders of the defined types were placed in this encounter.  No orders of the defined types were placed in this encounter.   Imaging: No results found.  PMFS History: Patient Active Problem List   Diagnosis Date Noted  . Prediabetes   . Acute sinusitis 02/10/2014  . Acute bronchitis 02/10/2014  . ESSENTIAL HYPERTENSION, BENIGN 07/25/2010  . Mixed hyperlipidemia 05/31/2010  . CAD, NATIVE VESSEL 05/31/2010  . FATIGUE / MALAISE 05/31/2010  . HYPERTENSION, HX OF 05/31/2010   Past Medical History:  Diagnosis Date  . Coronary artery disease 2011    2 stents placed  . H/O hiatal hernia   . Hyperlipidemia    borderline; status post stress test x2, the last one 5-6 years ago and without significant abnormality per the patient  . Hypertension    for approximately 5 years  . Myocardial infarction (HCC) 2011  . Prediabetes     Family History  Problem Relation Age of Onset  . Heart disease Father   . Heart attack Unknown   . Sudden death Paternal Uncle        possible MI  . Colon cancer Neg Hx     Past Surgical History:  Procedure Laterality Date  . CHOLECYSTECTOMY    . COLONOSCOPY    . CORONARY ANGIOPLASTY     DES to mid and distal RCA 05/15/10  . ROTATOR CUFF REPAIR Left 10/20/2013   Dr August Saucer  . SHOULDER ARTHROSCOPY WITH OPEN ROTATOR CUFF REPAIR Left 10/20/2013   Procedure:  SHOULDER ARTHROSCOPY WITH OPEN ROTATOR CUFF REPAIR;  Surgeon: Cammy Copa, MD;  Location: [facility] OR;  Service: Orthopedics;  Laterality: Left;  Left shoulder arthroscopy, debridement, open biceps tenodesis and rotator cuff repair.   Social History   Occupational History  . Not on file  Tobacco Use  . Smoking status: Former Smoker    Types: Cigarettes    Last attempt to quit: 12/03/1968    Years since quitting: 49.8  . Smokeless tobacco: Never Used  Substance and Sexual Activity  . Alcohol use: No  . Drug use: No  . Sexual activity: Not on file

## 2018-09-12 ENCOUNTER — Ambulatory Visit (INDEPENDENT_AMBULATORY_CARE_PROVIDER_SITE_OTHER): Payer: Medicare Other

## 2018-09-12 DIAGNOSIS — Z23 Encounter for immunization: Secondary | ICD-10-CM

## 2018-09-12 NOTE — Progress Notes (Signed)
Patient was in office to receive the high dose flu vaccine.Patient received the vaccine in his left deltoid Patient tolerated well

## 2018-10-13 ENCOUNTER — Encounter (INDEPENDENT_AMBULATORY_CARE_PROVIDER_SITE_OTHER): Payer: Self-pay | Admitting: Orthopedic Surgery

## 2018-10-13 ENCOUNTER — Ambulatory Visit (INDEPENDENT_AMBULATORY_CARE_PROVIDER_SITE_OTHER): Payer: Medicare Other | Admitting: Orthopedic Surgery

## 2018-10-13 DIAGNOSIS — G5602 Carpal tunnel syndrome, left upper limb: Secondary | ICD-10-CM

## 2018-10-13 NOTE — Progress Notes (Signed)
   Post-Op Visit Note   Patient: Paul Rubio           Date of Birth: [birth year]           MRN: [id number] Visit Date: 10/13/2018 PCP: Paul Brooks, MD   Assessment & Plan:  Chief Complaint:  Chief Complaint  Patient presents with  . Left Wrist - Follow-up   Visit Diagnoses:  1. Carpal tunnel syndrome, left upper limb     Plan: Paul Rubio is a patient is now 6 Weeks Out Left Carpal Tunl. release.  He is doing well.  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  Reports diminishing pain and no numbness.  On exam he has good grip strength and the incision is well-healed.  Plan is to let him return to activity as tolerated.  That nerve is somewhat exposed with release of the transverse carpal ligaments we need to be careful over the next several months but overall he is doing well I will see him back as needed.  Follow-Up Instructions: Return if symptoms worsen or fail to improve.   Orders:  No orders of the defined types were placed in this encounter.  No orders of the defined types were placed in this encounter.   Imaging: No results found.  PMFS History: Patient Active Problem List   Diagnosis Date Noted  . Prediabetes   . Acute sinusitis 02/10/2014  . Acute bronchitis 02/10/2014  . ESSENTIAL HYPERTENSION, BENIGN 07/25/2010  . Mixed hyperlipidemia 05/31/2010  . CAD, NATIVE VESSEL 05/31/2010  . FATIGUE / MALAISE 05/31/2010  . HYPERTENSION, HX OF 05/31/2010   Past Medical History:  Diagnosis Date  . Coronary artery disease 2011    2 stents placed  . H/O hiatal hernia   . Hyperlipidemia    borderline; status post stress test x2, the last one 5-6 years ago and without significant abnormality per the patient  . Hypertension    for approximately 5 years  . Myocardial infarction (HCC) 2011  . Prediabetes     Family History  Problem Relation Age of Onset  . Heart disease Father   . Heart attack Unknown   . Sudden death Paternal Uncle       possible MI  . Colon cancer Neg Hx     Past Surgical History:  Procedure Laterality Date  . CHOLECYSTECTOMY    . COLONOSCOPY    . CORONARY ANGIOPLASTY     DES to mid and distal RCA 05/15/10  . ROTATOR CUFF REPAIR Left 10/20/2013   Dr Paul Rubio  . SHOULDER ARTHROSCOPY WITH OPEN ROTATOR CUFF REPAIR Left 10/20/2013   Procedure: SHOULDER ARTHROSCOPY WITH OPEN ROTATOR CUFF REPAIR;  Surgeon: Paul Copa, MD;  Location: [facility] OR;  Service: Orthopedics;  Laterality: Left;  Left shoulder arthroscopy, debridement, open biceps tenodesis and rotator cuff repair.   Social History   Occupational History  . Not on file  Tobacco Use  . Smoking status: Former Smoker    Types: Cigarettes    Last attempt to quit: 12/03/1968    Years since quitting: 49.8  . Smokeless tobacco: Never Used  Substance and Sexual Activity  . Alcohol use: No  . Drug use: No  . Sexual activity: Not on file

## 2018-10-23 ENCOUNTER — Ambulatory Visit: Payer: Medicare Other | Admitting: Cardiovascular Disease

## 2018-10-23 ENCOUNTER — Encounter: Payer: Self-pay | Admitting: Cardiovascular Disease

## 2018-10-23 VITALS — BP 122/86 | HR 69 | Ht 68.0 in | Wt 220.8 lb

## 2018-10-23 DIAGNOSIS — I2510 Atherosclerotic heart disease of native coronary artery without angina pectoris: Secondary | ICD-10-CM | POA: Diagnosis not present

## 2018-10-23 DIAGNOSIS — I10 Essential (primary) hypertension: Secondary | ICD-10-CM

## 2018-10-23 DIAGNOSIS — E782 Mixed hyperlipidemia: Secondary | ICD-10-CM

## 2018-10-23 NOTE — Progress Notes (Signed)
Cardiology Office Note:    Date:  10/23/2018   ID:  Paul Rubio, DOB [birth year], MRN [id number]  PCP:  Donita Brooks, MD  Cardiologist:  No primary care provider on file.  Electrophysiologist:  None   Referring MD: Donita Brooks, MD   Chief Complaint  Patient presents with  . Coronary Artery Disease    History of Present Illness:    Paul Rubio is a 67 y.o. male with a hx of coronary artery disease, presenting for follow-up evaluation.  The patient initially presented in 2011 with a non-STEMI treated with stenting of the mid and distal right coronary artery.  He was noted to have mild nonobstructive disease in the LAD and left circumflex and mild LV segmental dysfunction with an LVEF of 50%.  Clopidogrel was discontinued at the time of his office visit last year.  He is also followed for hypertension and hyperlipidemia.  The patient is here alone today.  He is been doing well since I saw him last.  He has intentionally lost about 10 to 15 pounds by cutting out sodas.  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  He has no exertional symptoms.  He specifically denies chest pain, chest pressure, shortness of breath, leg swelling, orthopnea, or PND.  He denies heart palpitations.  He is compliant with his medications.  Past Medical History:  Diagnosis Date  . Coronary artery disease 2011    2 stents placed  . H/O hiatal hernia   . Hyperlipidemia    borderline; status post stress test x2, the last one 5-6 years ago and without significant abnormality per the patient  . Hypertension    for approximately 5 years  . Myocardial infarction (HCC) 2011  . Prediabetes     Past Surgical History:  Procedure Laterality Date  . CHOLECYSTECTOMY    . COLONOSCOPY    . CORONARY ANGIOPLASTY     DES to mid and distal RCA 05/15/10  . ROTATOR CUFF REPAIR Left 10/20/2013   Dr August Saucer  . SHOULDER ARTHROSCOPY WITH OPEN ROTATOR CUFF REPAIR Left  10/20/2013   Procedure: SHOULDER ARTHROSCOPY WITH OPEN ROTATOR CUFF REPAIR;  Surgeon: Cammy Copa, MD;  Location: [facility] OR;  Service: Orthopedics;  Laterality: Left;  Left shoulder arthroscopy, debridement, open biceps tenodesis and rotator cuff repair.    Current Medications: Current Meds  Medication Sig  . Ascorbic Acid (VITAMIN C PO) Take 1 tablet daily by mouth.  Marland Kitchen aspirin EC 81 MG tablet Take 81 mg by mouth daily.  Marland Kitchen atorvastatin (LIPITOR) 40 MG tablet Take 1 tablet (40 mg total) by mouth daily at 6 PM. Please make yearly appt with Dr. Excell Seltzer for November for future refills. 1st attempt  . Cholecalciferol (VITAMIN D) 2000 UNITS tablet Take 2,000 Units by mouth 2 (two) times daily.  . fexofenadine (ALLEGRA) 180 MG tablet Take 180 mg by mouth daily.  Marland Kitchen losartan (COZAAR) 50 MG tablet TAKE 1 TABLET BY MOUTH DAILY  . metoprolol tartrate (LOPRESSOR) 25 MG tablet Take 0.5 tablets (12.5 mg total) by mouth 2 (two) times daily. Please make yearly appt with Dr. Excell Seltzer for November. 1st attempt  . Multiple Vitamin (MULTIVITAMIN WITH MINERALS) TABS tablet Take 1 tablet by mouth daily.  . nitroGLYCERIN (NITROSTAT) 0.4 MG SL tablet Place 1 tablet (0.4 mg total) under the tongue every 5 (five) minutes as needed for chest pain.  . Omega-3 Fatty Acids (FISH OIL TRIPLE STRENGTH) 1400 MG CAPS  Take 1,400 mg by mouth daily.  . ranitidine (ZANTAC) 75 MG tablet Take 75 mg by mouth every morning.    Current Facility-Administered Medications for the 10/23/18 encounter (Office Visit) with Tonny Bollman, MD  Medication  . 0.9 %  sodium chloride infusion     Allergies:   Patient has no known allergies.   Social History   Socioeconomic History  . Marital status: Married    Spouse name: Not on file  . Number of children: 2  . Years of education: Not on file  . Highest education level: Not on file  Occupational History  . Not on file  Social Needs  . Financial resource strain: Not on file  . Food  insecurity:    Worry: Not on file    Inability: Not on file  . Transportation needs:    Medical: Not on file    Non-medical: Not on file  Tobacco Use  . Smoking status: Former Smoker    Types: Cigarettes    Last attempt to quit: 12/03/1968    Years since quitting: 49.9  . Smokeless tobacco: Never Used  Substance and Sexual Activity  . Alcohol use: No  . Drug use: No  . Sexual activity: Not on file  Lifestyle  . Physical activity:    Days per week: Not on file    Minutes per session: Not on file  . Stress: Not on file  Relationships  . Social connections:    Talks on phone: Not on file    Gets together: Not on file    Attends religious service: Not on file    Active member of club or organization: Not on file    Attends meetings of clubs or organizations: Not on file    Relationship status: Not on file  Other Topics Concern  . Not on file  Social History Narrative   Quit tobacco greater than 20 years ago with approximately 10 pack-year history. Denies alcohol or drug abuse     Family History: The patient's family history includes Heart attack in his unknown relative; Heart disease in his father; Sudden death in his paternal uncle. There is no history of Colon cancer.  ROS:   Please see the history of present illness.    All other systems reviewed and are negative.  EKGs/Labs/Other Studies Reviewed:    EKG:  EKG is ordered today.  The ekg ordered today demonstrates NSR, possible age-indeterminate inferior infarct, otherwise normal. No change since last tracing 10-17-2017  Recent Labs: 06/27/2018: ALT 32; BUN 34; Creat 1.13; Hemoglobin 14.6; Platelets 243; Potassium 4.7; Sodium 141  Recent Lipid Panel    Component Value Date/Time   CHOL 96 06/27/2018 1039   TRIG 48 06/27/2018 1039   HDL 45 06/27/2018 1039   CHOLHDL 2.1 06/27/2018 1039   VLDL 13 12/28/2016 0830   LDLCALC 37 06/27/2018 1039    Physical Exam:    VS:  BP 122/86   Pulse 69   Ht 5\' 8"  (1.727 m)    Wt 220 lb 12.8 oz (100.2 kg)   SpO2 92%   BMI 33.57 kg/m     Wt Readings from Last 3 Encounters:  10/23/18 220 lb 12.8 oz (100.2 kg)  01/03/18 227 lb (103 kg)  10/17/17 231 lb 6.4 oz (105 kg)     GEN: Well nourished, well developed in no acute distress HEENT: Normal NECK: No JVD; No carotid bruits LYMPHATICS: No lymphadenopathy CARDIAC: RRR, no murmurs, rubs, gallops RESPIRATORY:  Clear to  auscultation without rales, wheezing or rhonchi  ABDOMEN: Soft, non-tender, non-distended MUSCULOSKELETAL:  No edema; No deformity  SKIN: Warm and dry NEUROLOGIC:  Alert and oriented x 3 PSYCHIATRIC:  Normal affect   ASSESSMENT:    1. Coronary artery disease involving native coronary artery of native heart without angina pectoris   2. Mixed hyperlipidemia   3. Essential hypertension, benign    PLAN:    In order of problems listed above:  1. The patient is stable on his current medical regimen.  He will continue on aspirin for antiplatelet therapy, a high intensity statin drug, and a beta-blocker. 2. Lipids reviewed with a total cholesterol of 96, HDL 45, LDL 37, and triglycerides 48.  Lifestyle modification is discussed. 3. Continue metoprolol and losartan.  Labs are reviewed with normal creatinine and potassium levels.  The patient appears stable from a cardiac perspective.  For follow-up I will see him back in 1 year.  Medication Adjustments/Labs and Tests Ordered: Current medicines are reviewed at length with the patient today.  Concerns regarding medicines are outlined above.  No orders of the defined types were placed in this encounter.  No orders of the defined types were placed in this encounter.   There are no Patient Instructions on file for this visit.   Signed, Tonny BollmanMichael Tiea Manninen, MD  10/23/2018 12:06 PM    [facility]

## 2018-10-23 NOTE — Patient Instructions (Signed)

## 2018-11-09 ENCOUNTER — Other Ambulatory Visit: Payer: Self-pay | Admitting: Cardiovascular Disease

## 2018-11-09 DIAGNOSIS — I2510 Atherosclerotic heart disease of native coronary artery without angina pectoris: Secondary | ICD-10-CM

## 2018-12-24 ENCOUNTER — Ambulatory Visit: Payer: Medicare Other | Admitting: Cardiovascular Disease

## 2019-01-05 ENCOUNTER — Encounter: Payer: Self-pay | Admitting: Family Medicine

## 2019-01-05 ENCOUNTER — Ambulatory Visit (INDEPENDENT_AMBULATORY_CARE_PROVIDER_SITE_OTHER): Payer: Medicare Other | Admitting: Family Medicine

## 2019-01-05 VITALS — BP 130/74 | HR 70 | Temp 98.1°F | Resp 16 | Ht 68.0 in | Wt 223.0 lb

## 2019-01-05 DIAGNOSIS — E7800 Pure hypercholesterolemia, unspecified: Secondary | ICD-10-CM | POA: Diagnosis not present

## 2019-01-05 DIAGNOSIS — E119 Type 2 diabetes mellitus without complications: Secondary | ICD-10-CM

## 2019-01-05 DIAGNOSIS — I10 Essential (primary) hypertension: Secondary | ICD-10-CM

## 2019-01-05 DIAGNOSIS — Z125 Encounter for screening for malignant neoplasm of prostate: Secondary | ICD-10-CM

## 2019-01-05 DIAGNOSIS — Z0000 Encounter for general adult medical examination without abnormal findings: Secondary | ICD-10-CM

## 2019-01-05 NOTE — Progress Notes (Signed)
Subjective:    Patient ID: Paul Rubio, male    DOB: 1950-12-08, 68 y.o.   MRN: [id number]  HPI  Patient is here today for complete physical exam.  His last colonoscopy was April 2018 and was normal.  He is due for prostate cancer screening today.  He is also due for diabetic labs.  His last hemoglobin A1c was 6.7 in July of last year.  He denies any polyuria, polydipsia, or blurry vision.  His immunizations are up-to-date as documented below: Immunization History  Administered Date(s) Administered  . Influenza, High Dose Seasonal PF 09/12/2018  . Influenza,inj,Quad PF,6+ Mos 10/21/2013, 09/28/2014, 12/27/2015, 09/27/2016, 10/01/2017  . Pneumococcal Conjugate-13 12/27/2015  . Pneumococcal Polysaccharide-23 12/17/2014  . Td 11/21/2012  . Tdap 11/21/2012   He is due for Shingrix.  I recommended this and he can check on the price at his local pharmacy.  He denies any issues with falls, memory loss, or depression.  I am slightly concerned as the patient does seem to demonstrate some mild difficulty finding words as he is speaking to me.  He is able to finish his sentences appropriately but he does seem to be having a little bit more difficulty than in the past.  This is something that I would like to clinically monitor.  He does report increased urinary frequency, sudden urge to urinate.  If he does not go as soon as he feels the symptoms, he will have the urge incontinence.  His prostate is checked today and is completely normal.  He denies any dribbling or weak stream. Past Medical History:  Diagnosis Date  . Coronary artery disease 2011    2 stents placed  . H/O hiatal hernia   . Hyperlipidemia    borderline; status post stress test x2, the last one 5-6 years ago and without significant abnormality per the patient  . Hypertension    for approximately 5 years  . Myocardial infarction (HCC) 2011  . Prediabetes    Past Surgical History:  Procedure Laterality Date  . CHOLECYSTECTOMY      . COLONOSCOPY    . CORONARY ANGIOPLASTY     DES to mid and distal RCA 05/15/10  . ROTATOR CUFF REPAIR Left 10/20/2013   Dr August Saucerean  . SHOULDER ARTHROSCOPY WITH OPEN ROTATOR CUFF REPAIR Left 10/20/2013   Procedure: SHOULDER ARTHROSCOPY WITH OPEN ROTATOR CUFF REPAIR;  Surgeon: Cammy CopaGregory Scott Dean, MD;  Location[facility];  Service: Orthopedics;  Laterality: Left;  Left shoulder arthroscopy, debridement, open biceps tenodesis and rotator cuff repair.   Current Outpatient Medications on File Prior to Visit  Medication Sig Dispense Refill  . Ascorbic Acid (VITAMIN C PO) Take 1 tablet daily by mouth.    Marland Kitchen. aspirin EC 81 MG tablet Take 81 mg by mouth daily.    Marland Kitchen. atorvastatin (LIPITOR) 40 MG tablet TAKE 1 TABLET BY MOUTH DAILY AT 6PM 90 tablet 3  . Cholecalciferol (VITAMIN D) 2000 UNITS tablet Take 2,000 Units by mouth 2 (two) times daily.    . fexofenadine (ALLEGRA) 180 MG tablet Take 180 mg by mouth daily.    Marland Kitchen. losartan (COZAAR) 50 MG tablet TAKE 1 TABLET BY MOUTH DAILY 90 tablet 3  . metoprolol tartrate (LOPRESSOR) 25 MG tablet TAKE 1/2 TABLET BY MOUTH TWICE DAILY 90 tablet 3  . Multiple Vitamin (MULTIVITAMIN WITH MINERALS) TABS tablet Take 1 tablet by mouth daily.    . nitroGLYCERIN (NITROSTAT) 0.4 MG SL tablet Place 1 tablet (0.4 mg total) under the  tongue every 5 (five) minutes as needed for chest pain. 25 tablet 4   No current facility-administered medications on file prior to visit.    No Known Allergies Social History   Socioeconomic History  . Marital status: Married    Spouse name: Not on file  . Number of children: 2  . Years of education: Not on file  . Highest education level: Not on file  Occupational History  . Not on file  Social Needs  . Financial resource strain: Not on file  . Food insecurity:    Worry: Not on file    Inability: Not on file  . Transportation needs:    Medical: Not on file    Non-medical: Not on file  Tobacco Use  . Smoking status: Former Smoker     Types: Cigarettes    Last attempt to quit: 12/03/1968    Years since quitting: 50.1  . Smokeless tobacco: Never Used  Substance and Sexual Activity  . Alcohol use: No  . Drug use: No  . Sexual activity: Not on file  Lifestyle  . Physical activity:    Days per week: Not on file    Minutes per session: Not on file  . Stress: Not on file  Relationships  . Social connections:    Talks on phone: Not on file    Gets together: Not on file    Attends religious service: Not on file    Active member of club or organization: Not on file    Attends meetings of clubs or organizations: Not on file    Relationship status: Not on file  . Intimate partner violence:    Fear of current or ex partner: Not on file    Emotionally abused: Not on file    Physically abused: Not on file    Forced sexual activity: Not on file  Other Topics Concern  . Not on file  Social History Narrative   Quit tobacco greater than 20 years ago with approximately 10 pack-year history. Denies alcohol or drug abuse   Family History  Problem Relation Age of Onset  . Heart disease Father   . Heart attack Unknown   . Sudden death Paternal Uncle        possible MI  . Colon cancer Neg Hx      Review of Systems  All other systems reviewed and are negative.      Objective:   Physical Exam  Constitutional: He is oriented to person, place, and time. He appears well-developed and well-nourished. No distress.  HENT:  Head: Normocephalic and atraumatic.  Right Ear: External ear normal.  Left Ear: External ear normal.  Nose: Nose normal.  Mouth/Throat: Oropharynx is clear and moist. No oropharyngeal exudate.  Eyes: Pupils are equal, round, and reactive to light. Conjunctivae and EOM are normal. Right eye exhibits no discharge. Left eye exhibits no discharge. No scleral icterus.  Neck: Normal range of motion. Neck supple. No JVD present. No tracheal deviation present. No thyromegaly present.  Cardiovascular: Normal rate,  regular rhythm, normal heart sounds and intact distal pulses. Exam reveals no gallop and no friction rub.  No murmur heard. Pulmonary/Chest: Effort normal and breath sounds normal. No stridor. No respiratory distress. He has no wheezes. He has no rales. He exhibits no tenderness.  Abdominal: Soft. Bowel sounds are normal. He exhibits no distension and no mass. There is no abdominal tenderness. There is no rebound and no guarding.  Genitourinary:    Prostate, penis  and rectum normal.   Musculoskeletal: Normal range of motion.        General: No tenderness or edema.  Lymphadenopathy:    He has no cervical adenopathy.  Neurological: He is alert and oriented to person, place, and time. He has normal reflexes. No cranial nerve deficit. He exhibits normal muscle tone. Coordination normal.  Skin: Skin is warm. He is not diaphoretic.  Psychiatric: He has a normal mood and affect. His behavior is normal. Judgment and thought content normal.  Vitals reviewed.         Assessment & Plan:  Controlled type 2 diabetes mellitus without complication, without long-term current use of insulin (HCC) - Plan: Hemoglobin A1c, CBC with Differential/Platelet, COMPLETE METABOLIC PANEL WITH GFR, Lipid panel, Microalbumin, urine  Benign essential HTN  Pure hypercholesterolemia  Screening PSA (prostate specific antigen) - Plan: PSA I will screen the patient for prostate cancer with a PSA however his prostate exam today is normal.  Therefore I believe the urge incontinence he is experiencing is likely due to to overactive bladder.  I gave the patient samples of Myrbetriq 25 mg a day and he will try this for 1 month and then call me back and let me know if it helps.  If not we can increase to 50 mg a day.  His blood pressure today is well controlled at 130/74.  I will check a microalbumin to evaluate for diabetic nephropathy.  I will check a hemoglobin A1c to evaluate his glycemic control.  Ideally I like his A1c to be  less than 6.5.  I will check a fasting lipid panel.  I would like his LDL cholesterol to be below 100.  Colonoscopy is up-to-date.  Immunizations are up-to-date except for Shingrix which I recommended his local pharmacy.  There is no issues with falls or depression.  He denies any problems with memory loss however again he seems to be having some slight difficulty thinking of words as he is speaking to me.  While the remainder of his exam is normal this is something I would clinically monitor for signs of memory loss.

## 2019-01-06 LAB — COMPLETE METABOLIC PANEL WITH GFR
AG Ratio: 1.4 (calc) (ref 1.0–2.5)
ALT: 31 U/L (ref 9–46)
AST: 26 U/L (ref 10–35)
Albumin: 4.1 g/dL (ref 3.6–5.1)
Alkaline phosphatase (APISO): 63 U/L (ref 35–144)
BUN: 24 mg/dL (ref 7–25)
CO2: 28 mmol/L (ref 20–32)
Calcium: 9.4 mg/dL (ref 8.6–10.3)
Chloride: 105 mmol/L (ref 98–110)
Creat: 1.01 mg/dL (ref 0.70–1.25)
GFR, Est African American: 88 mL/min/{1.73_m2} (ref >=60)
GFR, Est Non African American: 76 mL/min/{1.73_m2} (ref >=60)
Globulin: 3 g/dL (calc) (ref 1.9–3.7)
Glucose, Bld: 120 mg/dL — ABNORMAL HIGH (ref 65–99)
Potassium: 4.8 mmol/L (ref 3.5–5.3)
Sodium: 141 mmol/L (ref 135–146)
Total Bilirubin: 0.5 mg/dL (ref 0.2–1.2)
Total Protein: 7.1 g/dL (ref 6.1–8.1)

## 2019-01-06 LAB — CBC WITH DIFFERENTIAL/PLATELET
Absolute Monocytes: 678 cells/uL (ref 200–950)
Basophils Absolute: 26 cells/uL (ref 0–200)
Basophils Relative: 0.3 %
Eosinophils Absolute: 150 cells/uL (ref 15–500)
Eosinophils Relative: 1.7 %
HCT: 44.9 % (ref 38.5–50.0)
Hemoglobin: 15.2 g/dL (ref 13.2–17.1)
Lymphs Abs: 2684 cells/uL (ref 850–3900)
MCH: 30.1 pg (ref 27.0–33.0)
MCHC: 33.9 g/dL (ref 32.0–36.0)
MCV: 88.9 fL (ref 80.0–100.0)
MPV: 11.1 fL (ref 7.5–12.5)
Monocytes Relative: 7.7 %
Neutro Abs: 5262 cells/uL (ref 1500–7800)
Neutrophils Relative %: 59.8 %
Platelets: 239 10*3/uL (ref 140–400)
RBC: 5.05 10*6/uL (ref 4.20–5.80)
RDW: 12.1 % (ref 11.0–15.0)
Total Lymphocyte: 30.5 %
WBC: 8.8 10*3/uL (ref 3.8–10.8)

## 2019-01-06 LAB — LIPID PANEL
Cholesterol: 102 mg/dL (ref <200)
HDL: 44 mg/dL (ref >=40)
LDL Cholesterol (Calc): 39 mg/dL (calc)
Non-HDL Cholesterol (Calc): 58 mg/dL (calc) (ref <130)
Total CHOL/HDL Ratio: 2.3 (calc) (ref <5.0)
Triglycerides: 104 mg/dL (ref <150)

## 2019-01-06 LAB — HEMOGLOBIN A1C
Hgb A1c MFr Bld: 7 % of total Hgb — ABNORMAL HIGH (ref <5.7)
Mean Plasma Glucose: 154 (calc)
eAG (mmol/L): 8.5 (calc)

## 2019-01-06 LAB — MICROALBUMIN, URINE: Microalb, Ur: 1.8 mg/dL

## 2019-01-06 LAB — PSA: PSA: 1.2 ng/mL (ref <=4.0)

## 2019-01-07 ENCOUNTER — Other Ambulatory Visit: Payer: Self-pay | Admitting: Family Medicine

## 2019-01-07 MED ORDER — METFORMIN HCL 850 MG PO TABS: ORAL_TABLET | ORAL | 3 refills | Status: DC

## 2019-01-23 ENCOUNTER — Encounter: Payer: Self-pay | Admitting: Family Medicine

## 2019-01-23 ENCOUNTER — Ambulatory Visit (INDEPENDENT_AMBULATORY_CARE_PROVIDER_SITE_OTHER): Payer: Medicare Other | Admitting: Family Medicine

## 2019-01-23 ENCOUNTER — Other Ambulatory Visit: Payer: Self-pay | Admitting: Family Medicine

## 2019-01-23 VITALS — BP 130/68 | HR 70 | Temp 98.5°F | Resp 14 | Ht 68.0 in | Wt 221.0 lb

## 2019-01-23 DIAGNOSIS — J069 Acute upper respiratory infection, unspecified: Secondary | ICD-10-CM

## 2019-01-23 DIAGNOSIS — H6121 Impacted cerumen, right ear: Secondary | ICD-10-CM

## 2019-01-23 MED ORDER — LEVOCETIRIZINE DIHYDROCHLORIDE 5 MG PO TABS: 5.0000 mg | ORAL_TABLET | Freq: Every evening | ORAL | 0 refills | Status: DC

## 2019-01-23 MED ORDER — FLUTICASONE PROPIONATE 50 MCG/ACT NA SUSP: 2.0000 | Freq: Every day | NASAL | 6 refills | Status: DC

## 2019-01-23 NOTE — Progress Notes (Signed)
Subjective:    Patient ID: Paul Rubio, male    DOB: 11-Jun-1951, 68 y.o.   MRN: [id number]  HPI Patient states for the last 2 weeks, his ears have felt stopped up.  He is also had a head cold with lots of head congestion and rhinorrhea and he wonders if it could be connected.  Upon further discussion, he states that his left ear does not feel that bad it feels close to normal however his right ear feels like a "bug has crawled into it".  He denies any sinus pain in his frontal or maxillary sinuses.  He denies any dental pain.  He denies any otalgia or dizziness.  He denies any cough or sore throat.  The rhinorrhea is clear.  He primarily feels pressure and hearing loss in his right ear.  Examination, the patient has a cerumen impaction in his right auditory canal  Past Medical History:  Diagnosis Date  . Coronary artery disease 2011    2 stents placed  . H/O hiatal hernia   . Hyperlipidemia    borderline; status post stress test x2, the last one 5-6 years ago and without significant abnormality per the patient  . Hypertension    for approximately 5 years  . Myocardial infarction (HCC) 2011  . Prediabetes    Past Surgical History:  Procedure Laterality Date  . CHOLECYSTECTOMY    . COLONOSCOPY    . CORONARY ANGIOPLASTY     DES to mid and distal RCA 05/15/10  . ROTATOR CUFF REPAIR Left 10/20/2013   Dr August Saucer  . SHOULDER ARTHROSCOPY WITH OPEN ROTATOR CUFF REPAIR Left 10/20/2013   Procedure: SHOULDER ARTHROSCOPY WITH OPEN ROTATOR CUFF REPAIR;  Surgeon: Cammy Copa, MD;  Location: [facility] OR;  Service: Orthopedics;  Laterality: Left;  Left shoulder arthroscopy, debridement, open biceps tenodesis and rotator cuff repair.   Current Outpatient Medications on File Prior to Visit  Medication Sig Dispense Refill  . Ascorbic Acid (VITAMIN C PO) Take 1 tablet daily by mouth.    Marland Kitchen aspirin EC 81 MG tablet Take 81 mg by mouth daily.    Marland Kitchen atorvastatin (LIPITOR) 40 MG tablet TAKE 1 TABLET BY  MOUTH DAILY AT 6PM 90 tablet 3  . Cholecalciferol (VITAMIN D) 2000 UNITS tablet Take 2,000 Units by mouth 2 (two) times daily.    . fexofenadine (ALLEGRA) 180 MG tablet Take 180 mg by mouth daily.    Marland Kitchen losartan (COZAAR) 50 MG tablet TAKE 1 TABLET BY MOUTH DAILY 90 tablet 3  . metFORMIN (GLUCOPHAGE) 850 MG tablet 1 tab po qd x 2 weeks then 1 tab po bid thereafter 60 tablet 3  . metoprolol tartrate (LOPRESSOR) 25 MG tablet TAKE 1/2 TABLET BY MOUTH TWICE DAILY 90 tablet 3  . Multiple Vitamin (MULTIVITAMIN WITH MINERALS) TABS tablet Take 1 tablet by mouth daily.    . nitroGLYCERIN (NITROSTAT) 0.4 MG SL tablet Place 1 tablet (0.4 mg total) under the tongue every 5 (five) minutes as needed for chest pain. 25 tablet 4   No current facility-administered medications on file prior to visit.    No Known Allergies Social History   Socioeconomic History  . Marital status: Married    Spouse name: Not on file  . Number of children: 2  . Years of education: Not on file  . Highest education level: Not on file  Occupational History  . Not on file  Social Needs  . Financial resource strain: Not on file  . Food  insecurity:    Worry: Not on file    Inability: Not on file  . Transportation needs:    Medical: Not on file    Non-medical: Not on file  Tobacco Use  . Smoking status: Former Smoker    Types: Cigarettes    Last attempt to quit: 12/03/1968    Years since quitting: 50.1  . Smokeless tobacco: Never Used  Substance and Sexual Activity  . Alcohol use: No  . Drug use: No  . Sexual activity: Not on file  Lifestyle  . Physical activity:    Days per week: Not on file    Minutes per session: Not on file  . Stress: Not on file  Relationships  . Social connections:    Talks on phone: Not on file    Gets together: Not on file    Attends religious service: Not on file    Active member of club or organization: Not on file    Attends meetings of clubs or organizations: Not on file     Relationship status: Not on file  . Intimate partner violence:    Fear of current or ex partner: Not on file    Emotionally abused: Not on file    Physically abused: Not on file    Forced sexual activity: Not on file  Other Topics Concern  . Not on file  Social History Narrative   Quit tobacco greater than 20 years ago with approximately 10 pack-year history. Denies alcohol or drug abuse   Family History  Problem Relation Age of Onset  . Heart disease Father   . Heart attack Unknown   . Sudden death Paternal Uncle        possible MI  . Colon cancer Neg Hx      Review of Systems  All other systems reviewed and are negative.      Objective:   Physical Exam  Constitutional: He appears well-developed and well-nourished. No distress.  HENT:  Head: Normocephalic and atraumatic.  Right Ear: External ear normal.  Left Ear: Tympanic membrane, external ear and ear canal normal.  Ears:  Nose: Mucosal edema and rhinorrhea present.  Mouth/Throat: Oropharynx is clear and moist. No oropharyngeal exudate.  Eyes: Right eye exhibits no discharge. Left eye exhibits no discharge.  Cardiovascular: Normal rate, regular rhythm, normal heart sounds and intact distal pulses. Exam reveals no gallop and no friction rub.  No murmur heard. Pulmonary/Chest: Effort normal and breath sounds normal. No respiratory distress. He has no wheezes. He has no rales. He exhibits no tenderness.  Lymphadenopathy:    He has no cervical adenopathy.  Skin: He is not diaphoretic.  Vitals reviewed.         Assessment & Plan:  URI, acute  Hearing loss due to cerumen impaction, right  I believe this is multifactorial.  I believe the rhinorrhea and head congestion from an upper respiratory infection could be causing eustachian tube dysfunction bilaterally.  This coupled with cerumen impaction in his right ear is exacerbating his symptoms.  I have recommended treating the cerumen impaction with irrigation and  lavage.  I have recommended using Flonase and xyzal for the head congestion and eustachian tube dysfunction.  We were able to remove some of the cerumen impaction with irrigation.  Hearing loss improved.  I will treat the eustachian tube dysfunction and follow-up in 2 weeks

## 2019-01-27 DIAGNOSIS — Z0120 Encounter for dental examination and cleaning without abnormal findings: Secondary | ICD-10-CM | POA: Diagnosis not present

## 2019-02-04 DIAGNOSIS — Z85828 Personal history of other malignant neoplasm of skin: Secondary | ICD-10-CM | POA: Diagnosis not present

## 2019-02-04 DIAGNOSIS — D2361 Other benign neoplasm of skin of right upper limb, including shoulder: Secondary | ICD-10-CM | POA: Diagnosis not present

## 2019-02-04 DIAGNOSIS — L821 Other seborrheic keratosis: Secondary | ICD-10-CM | POA: Diagnosis not present

## 2019-02-04 DIAGNOSIS — L570 Actinic keratosis: Secondary | ICD-10-CM | POA: Diagnosis not present

## 2019-05-08 ENCOUNTER — Other Ambulatory Visit: Payer: Self-pay | Admitting: Family Medicine

## 2019-05-10 ENCOUNTER — Other Ambulatory Visit: Payer: Self-pay | Admitting: Family Medicine

## 2019-07-29 DIAGNOSIS — Z0120 Encounter for dental examination and cleaning without abnormal findings: Secondary | ICD-10-CM | POA: Diagnosis not present

## 2019-08-06 ENCOUNTER — Other Ambulatory Visit: Payer: Self-pay | Admitting: Family Medicine

## 2019-08-27 ENCOUNTER — Ambulatory Visit (INDEPENDENT_AMBULATORY_CARE_PROVIDER_SITE_OTHER): Payer: Medicare Other | Admitting: *Deleted

## 2019-08-27 ENCOUNTER — Other Ambulatory Visit: Payer: Self-pay

## 2019-08-27 DIAGNOSIS — Z23 Encounter for immunization: Secondary | ICD-10-CM | POA: Diagnosis not present

## 2019-08-27 NOTE — Progress Notes (Signed)
Patient seen in office for Influenza Vaccination.   Tolerated IM administration well.   Immunization history updated.  

## 2019-09-04 ENCOUNTER — Other Ambulatory Visit: Payer: Self-pay | Admitting: Family Medicine

## 2019-10-24 NOTE — Progress Notes (Signed)
Cardiology Office Note:    Date:  10/26/2019   ID:  Paul Rubio, DOB 09/07/1951, MRN [id number]  PCP:  Donita Brooks, MD  Cardiologist:  Tonny Bollman, MD  Electrophysiologist:  None   Referring MD: Donita Brooks, MD   Chief Complaint: follow-up of CAD  History of Present Illness:    Paul Rubio is a 68 y.o. male with a history of CAD, hypertension, hyperlipidemia, and prediabetes who is followed by Dr. Excell Seltzer and presents today for routine follow-up.  Patient presented in 2011 with a NSTEMI and was treated with DES x2 to mid and distal RCA. He has done well from a cardiac standpoint since that time. Hewas last seen by Dr. Excell Seltzer in 10/2018 at which time he was dong well and denied any cardiac symptoms. No medication changes were made and he was instructed to follow-up in 1 year.   Patient presents today for follow-up.  Patient doing great today from a cardiac standpoint. 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  He denies any cardiac symptoms including chest pain, shortness of breath palpitations, lightheadedness, dizziness, syncope, apnea, PND, edema. His only complaint today is increased urinary urgency which he states has been going on for the last several years. PCP is following this. He denies any dysuria or hematuria. No hematochezia. No recent fevers or illnesses.  Recent Labs Reviewed: - 01/2019: Hgb 15.2, Plts 239. Cr 1.01, K 4.8. Hgb A1c 7.0. Total Chol 102, Trig 104, HDL 44, LDL 39.   Past Medical History:  Diagnosis Date  . Coronary artery disease 2011    2 stents placed  . H/O hiatal hernia   . Hyperlipidemia    borderline; status post stress test x2, the last one 5-6 years ago and without significant abnormality per the patient  . Hypertension    for approximately 5 years  . Myocardial infarction (HCC) 2011  . Prediabetes     Past Surgical History:  Procedure Laterality Date  . CHOLECYSTECTOMY    . COLONOSCOPY    .  CORONARY ANGIOPLASTY     DES to mid and distal RCA 05/15/10  . ROTATOR CUFF REPAIR Left 10/20/2013   Dr August Saucer  . SHOULDER ARTHROSCOPY WITH OPEN ROTATOR CUFF REPAIR Left 10/20/2013   Procedure: SHOULDER ARTHROSCOPY WITH OPEN ROTATOR CUFF REPAIR;  Surgeon: Cammy Copa, MD;  Location: [facility] OR;  Service: Orthopedics;  Laterality: Left;  Left shoulder arthroscopy, debridement, open biceps tenodesis and rotator cuff repair.    Current Medications: Current Meds  Medication Sig  . Ascorbic Acid (VITAMIN C PO) Take 1 tablet daily by mouth.  Marland Kitchen aspirin EC 81 MG tablet Take 81 mg by mouth daily.  Marland Kitchen atorvastatin (LIPITOR) 40 MG tablet Take 1 tablet (40 mg total) by mouth daily at 6 PM.  . Cholecalciferol (VITAMIN D) 2000 UNITS tablet Take 2,000 Units by mouth 2 (two) times daily.  . fexofenadine (ALLEGRA) 180 MG tablet Take 180 mg by mouth daily.  . fluticasone (FLONASE) 50 MCG/ACT nasal spray Place 2 sprays into both nostrils daily.  Marland Kitchen levocetirizine (XYZAL) 5 MG tablet TAKE 1 TABLET(5 MG) BY MOUTH EVERY EVENING  . losartan (COZAAR) 50 MG tablet Take 1 tablet (50 mg total) by mouth daily.  . metFORMIN (GLUCOPHAGE) 850 MG tablet TAKE 1 TABLET BY MOUTH TWICE DAILY THEREAFTER  . metoprolol tartrate (LOPRESSOR) 25 MG tablet Take 0.5 tablets (12.5 mg total) by mouth 2 (two) times daily.  Marland Kitchen  Multiple Vitamin (MULTIVITAMIN WITH MINERALS) TABS tablet Take 1 tablet by mouth daily.  . nitroGLYCERIN (NITROSTAT) 0.4 MG SL tablet Place 1 tablet (0.4 mg total) under the tongue every 5 (five) minutes as needed for chest pain.  . [DISCONTINUED] atorvastatin (LIPITOR) 40 MG tablet TAKE 1 TABLET BY MOUTH DAILY AT 6PM  . [DISCONTINUED] losartan (COZAAR) 50 MG tablet TAKE 1 TABLET BY MOUTH DAILY  . [DISCONTINUED] metoprolol tartrate (LOPRESSOR) 25 MG tablet TAKE 1/2 TABLET BY MOUTH TWICE DAILY  . [DISCONTINUED] nitroGLYCERIN (NITROSTAT) 0.4 MG SL tablet Place 1 tablet (0.4 mg total) under the tongue every 5 (five)  minutes as needed for chest pain.     Allergies:   Patient has no known allergies.   Social History   Socioeconomic History  . Marital status: Married    Spouse name: Not on file  . Number of children: 2  . Years of education: Not on file  . Highest education level: Not on file  Occupational History  . Not on file  Social Needs  . Financial resource strain: Not on file  . Food insecurity    Worry: Not on file    Inability: Not on file  . Transportation needs    Medical: Not on file    Non-medical: Not on file  Tobacco Use  . Smoking status: Former Smoker    Types: Cigarettes    Quit date: 12/03/1968    Years since quitting: 50.9  . Smokeless tobacco: Never Used  Substance and Sexual Activity  . Alcohol use: No  . Drug use: No  . Sexual activity: Not on file  Lifestyle  . Physical activity    Days per week: Not on file    Minutes per session: Not on file  . Stress: Not on file  Relationships  . Social Musicianconnections    Talks on phone: Not on file    Gets together: Not on file    Attends religious service: Not on file    Active member of club or organization: Not on file    Attends meetings of clubs or organizations: Not on file    Relationship status: Not on file  Other Topics Concern  . Not on file  Social History Narrative   Quit tobacco greater than 20 years ago with approximately 10 pack-year history. Denies alcohol or drug abuse     Family History: The patient's family history includes Heart attack in an other family member; Heart disease in his father; Sudden death in his paternal uncle. There is no history of Colon cancer.  ROS:   Please see the history of present illness.    All other systems reviewed and are negative.  EKGs/Labs/Other Studies Reviewed:    The following studies were reviewed today:  Cardiac Catheterization 05/15/2010: Findings: - Lef ventriculography: There was hypokinesis of the basal to mid inferior wall. EF was estimated about 50%.   - RCA: RCA was a dominant vessel. There was 95% mid RCA stenosis. There was about 95% ostial PLV stenosis. There was a branch vessel off the proximal PLV that appeared to be totally occluded.  - Left main: Luminal irregularities in the left main. - Left circumflex system: There were luminal irregularities in the left circumflex and a large first obtuse marginal. - LAD system: There was a moderate-sized first diagonal luminal irregularities. There was about 30% proximal LAD stenosis and 30-40% diffuse distal LAD stenosis.   Conclusion: Successful PCI of tandem lesions in the mid and distal RCA  using Promus drug-eluting stents with improvement in the mid lesion from 95% to 0% and improvement in the distal lesions from 95% to 0%.   EKG:  EKG ordered today. EKG personally reviewed and demonstrates normal sinus rhythm, rate 63 bpm, with isolated T wave inversions in lead III (seen on prior tracings too). No significant changes compared to tracing from last year.  Recent Labs: 01/05/2019: ALT 31; BUN 24; Creat 1.01; Hemoglobin 15.2; Platelets 239; Potassium 4.8; Sodium 141  Recent Lipid Panel    Component Value Date/Time   CHOL 102 01/05/2019 1106   TRIG 104 01/05/2019 1106   HDL 44 01/05/2019 1106   CHOLHDL 2.3 01/05/2019 1106   VLDL 13 12/28/2016 0830   LDLCALC 39 01/05/2019 1106    Physical Exam:    Vital Signs: BP 120/78   Pulse 63   Ht 5\' 8"  (1.727 m)   Wt 212 lb 12.8 oz (96.5 kg)   SpO2 99%   BMI 32.36 kg/m     Wt Readings from Last 3 Encounters:  10/26/19 212 lb 12.8 oz (96.5 kg)  01/23/19 221 lb (100.2 kg)  01/05/19 223 lb (101.2 kg)     General: 68 y.o. male resting comfortably in no acute distress. HEENT: Normocephalic and atraumatic. Sclera clear. EOMs intact. Neck: Supple. No carotid bruits. No JVD. Heart: RRR. Distinct S1 and S2. No murmurs, gallops, or rubs. Radial and posterior tibial pulses 2+ and equal bilaterally. Lungs: No increased work of breathing. Clear to  ausculation bilaterally. No wheezes, rhonchi, or rales.  Abdomen: Soft, non-distended, and non-tender to palpation. Bowel sounds present. MSK: Normal strength and tone for age. Extremities: No clubbing, cyanosis, or edema.    Skin: Warm and dry. Neuro: Alert and oriented x3. No focal deficits. Psych: Normal affect. Responds appropriately.   Assessment:    1. Coronary artery disease involving native coronary artery of native heart without angina pectoris   2. Essential hypertension   3. Hyperlipidemia, unspecified hyperlipidemia type   4. Type 2 diabetes mellitus with complication, without long-term current use of insulin (HCC)     Plan:    CAD without Angina - History of NSTEMI in 2011 treated with DES x2 to mid and distal RCA. - EKG without acute changes. - Stable. No angina.  - Continue aspirin, beta-blocker, and statin. - Encouraged patient to continue to stay active and follow heart healthy diet.  Hypertension - BP well controlled at 120/78. - Continue current medications: Losartan 50mg  daily and Lopressor 12.5mg  twice daily. - CMET at PCP's office in 01/2019 showed stable creatinine and potassium.   Hyperlipidemia - Lipid panel from 01/2019: Total Cholesterol 102, Triglycerides 104, HDL 44, LDL 39. - At LDL goal of <70. - Continue Lipitor 40mg  daily. - Labs followed by PCP.  Type 2 Diabetes Mellitus - Hemoglobin A1c 7.0 in 01/2019. - On Metformin at home.  - Managed by PCP. - Encouraged patient to continues to follow heart healthy diet and limit carbs/sugars.   Disposition: Follow up in 1 year with Dr. Burt Knack.   Medication Adjustments/Labs and Tests Ordered: Current medicines are reviewed at length with the patient today.  Concerns regarding medicines are outlined above.  Orders Placed This Encounter  Procedures  . EKG 12-Lead   Meds ordered this encounter  Medications  . losartan (COZAAR) 50 MG tablet    Sig: Take 1 tablet (50 mg total) by mouth daily.     Dispense:  90 tablet    Refill:  3  .  metoprolol tartrate (LOPRESSOR) 25 MG tablet    Sig: Take 0.5 tablets (12.5 mg total) by mouth 2 (two) times daily.    Dispense:  90 tablet    Refill:  3  . atorvastatin (LIPITOR) 40 MG tablet    Sig: Take 1 tablet (40 mg total) by mouth daily at 6 PM.    Dispense:  90 tablet    Refill:  3  . nitroGLYCERIN (NITROSTAT) 0.4 MG SL tablet    Sig: Place 1 tablet (0.4 mg total) under the tongue every 5 (five) minutes as needed for chest pain.    Dispense:  25 tablet    Refill:  5    Nitroglycerin is a PRN medication and cannot be prescribed  In a 90 day supply. Please inform patient    Patient Instructions  Medication Instructions:  Your physician recommends that you continue on your current medications as directed. Please refer to the Current Medication list given to you today.  *If you need a refill on your cardiac medications before your next appointment, please call your pharmacy*  Lab Work: NONE If you have labs (blood work) drawn today and your tests are completely normal, you will receive your results only by: Marland Kitchen MyChart Message (if you have MyChart) OR . A paper copy in the mail If you have any lab test that is abnormal or we need to change your treatment, we will call you to review the results.  Testing/Procedures: NONE  Follow-Up: At [facility], you and your health needs are our priority.  As part of our continuing mission to provide you with exceptional heart care, we have created designated Provider Care Teams.  These Care Teams include your primary Cardiologist (physician) and Advanced Practice Providers (APPs -  Physician Assistants and Nurse Practitioners) who all work together to provide you with the care you need, when you need it.  Your next appointment:   12 month(s)  The format for your next appointment:   In Person  Provider:   You may see Tonny Bollman, MD or one of the following Advanced Practice Providers on your  designated Care Team:    Tereso Newcomer, PA-C  Vin [place], PA-C  Berton Bon, NP        Signed, Corrin Parker, [place]  10/26/2019 1:07 PM    [facility]

## 2019-10-26 ENCOUNTER — Encounter: Payer: Self-pay | Admitting: Physician Assistant

## 2019-10-26 ENCOUNTER — Ambulatory Visit: Payer: Medicare Other | Admitting: Student

## 2019-10-26 ENCOUNTER — Other Ambulatory Visit: Payer: Self-pay

## 2019-10-26 VITALS — BP 120/78 | HR 63 | Ht 68.0 in | Wt 212.8 lb

## 2019-10-26 DIAGNOSIS — E118 Type 2 diabetes mellitus with unspecified complications: Secondary | ICD-10-CM | POA: Diagnosis not present

## 2019-10-26 DIAGNOSIS — E785 Hyperlipidemia, unspecified: Secondary | ICD-10-CM

## 2019-10-26 DIAGNOSIS — I10 Essential (primary) hypertension: Secondary | ICD-10-CM

## 2019-10-26 DIAGNOSIS — I2510 Atherosclerotic heart disease of native coronary artery without angina pectoris: Secondary | ICD-10-CM | POA: Diagnosis not present

## 2019-10-26 MED ORDER — LOSARTAN POTASSIUM 50 MG PO TABS: 50.0000 mg | ORAL_TABLET | Freq: Every day | ORAL | 3 refills | Status: DC

## 2019-10-26 MED ORDER — METOPROLOL TARTRATE 25 MG PO TABS: 12.5000 mg | ORAL_TABLET | Freq: Two times a day (BID) | ORAL | 3 refills | Status: DC

## 2019-10-26 MED ORDER — ATORVASTATIN CALCIUM 40 MG PO TABS: 40.0000 mg | ORAL_TABLET | Freq: Every day | ORAL | 3 refills | Status: DC

## 2019-10-26 MED ORDER — NITROGLYCERIN 0.4 MG SL SUBL: 0.4000 mg | SUBLINGUAL_TABLET | SUBLINGUAL | 5 refills | Status: DC | PRN

## 2019-10-26 NOTE — Patient Instructions (Signed)
Medication Instructions:  Your physician recommends that you continue on your current medications as directed. Please refer to the Current Medication list given to you today.  *If you need a refill on your cardiac medications before your next appointment, please call your pharmacy*  Lab Work: NONE If you have labs (blood work) drawn today and your tests are completely normal, you will receive your results only by: Marland Kitchen MyChart Message (if you have MyChart) OR . A paper copy in the mail If you have any lab test that is abnormal or we need to change your treatment, we will call you to review the results.  Testing/Procedures: NONE  Follow-Up: At [facility], you and your health needs are our priority.  As part of our continuing mission to provide you with exceptional heart care, we have created designated Provider Care Teams.  These Care Teams include your primary Cardiologist (physician) and Advanced Practice Providers (APPs -  Physician Assistants and Nurse Practitioners) who all work together to provide you with the care you need, when you need it.  Your next appointment:   12 month(s)  The format for your next appointment:   In Person  Provider:   You may see Sherren Mocha, MD or one of the following Advanced Practice Providers on your designated Care Team:    Richardson Dopp, PA-C  Vin [place], [place]  Daune Perch, [place]

## 2019-12-14 DIAGNOSIS — Z85828 Personal history of other malignant neoplasm of skin: Secondary | ICD-10-CM | POA: Diagnosis not present

## 2019-12-14 DIAGNOSIS — L821 Other seborrheic keratosis: Secondary | ICD-10-CM | POA: Diagnosis not present

## 2019-12-14 DIAGNOSIS — D1801 Hemangioma of skin and subcutaneous tissue: Secondary | ICD-10-CM | POA: Diagnosis not present

## 2019-12-14 DIAGNOSIS — D225 Melanocytic nevi of trunk: Secondary | ICD-10-CM | POA: Diagnosis not present

## 2019-12-17 DIAGNOSIS — Z0120 Encounter for dental examination and cleaning without abnormal findings: Secondary | ICD-10-CM | POA: Diagnosis not present

## 2019-12-28 ENCOUNTER — Other Ambulatory Visit: Payer: Self-pay | Admitting: Family Medicine

## 2020-01-05 ENCOUNTER — Other Ambulatory Visit: Payer: Medicare Other

## 2020-01-05 ENCOUNTER — Other Ambulatory Visit: Payer: Self-pay

## 2020-01-05 DIAGNOSIS — E7800 Pure hypercholesterolemia, unspecified: Secondary | ICD-10-CM

## 2020-01-05 DIAGNOSIS — E119 Type 2 diabetes mellitus without complications: Secondary | ICD-10-CM | POA: Diagnosis not present

## 2020-01-05 DIAGNOSIS — I10 Essential (primary) hypertension: Secondary | ICD-10-CM

## 2020-01-05 DIAGNOSIS — Z125 Encounter for screening for malignant neoplasm of prostate: Secondary | ICD-10-CM | POA: Diagnosis not present

## 2020-01-06 LAB — MICROALBUMIN / CREATININE URINE RATIO
Creatinine, Urine: 191 mg/dL (ref 20–320)
Microalb Creat Ratio: 24 mcg/mg creat (ref <30)
Microalb, Ur: 4.6 mg/dL

## 2020-01-08 ENCOUNTER — Ambulatory Visit (INDEPENDENT_AMBULATORY_CARE_PROVIDER_SITE_OTHER): Payer: Medicare Other | Admitting: Family Medicine

## 2020-01-08 ENCOUNTER — Other Ambulatory Visit: Payer: Self-pay

## 2020-01-08 ENCOUNTER — Encounter: Payer: Self-pay | Admitting: Family Medicine

## 2020-01-08 VITALS — BP 136/80 | HR 89 | Temp 97.6°F | Ht 68.0 in | Wt 212.0 lb

## 2020-01-08 DIAGNOSIS — I10 Essential (primary) hypertension: Secondary | ICD-10-CM | POA: Diagnosis not present

## 2020-01-08 DIAGNOSIS — E7800 Pure hypercholesterolemia, unspecified: Secondary | ICD-10-CM

## 2020-01-08 DIAGNOSIS — Z0000 Encounter for general adult medical examination without abnormal findings: Secondary | ICD-10-CM | POA: Diagnosis not present

## 2020-01-08 DIAGNOSIS — E119 Type 2 diabetes mellitus without complications: Secondary | ICD-10-CM

## 2020-01-08 DIAGNOSIS — Z125 Encounter for screening for malignant neoplasm of prostate: Secondary | ICD-10-CM

## 2020-01-08 LAB — COMPREHENSIVE METABOLIC PANEL
AG Ratio: 1.5 (calc) (ref 1.0–2.5)
ALT: 24 U/L (ref 9–46)
AST: 21 U/L (ref 10–35)
Albumin: 4.1 g/dL (ref 3.6–5.1)
Alkaline phosphatase (APISO): 53 U/L (ref 35–144)
BUN: 25 mg/dL (ref 7–25)
CO2: 26 mmol/L (ref 20–32)
Calcium: 9.1 mg/dL (ref 8.6–10.3)
Chloride: 105 mmol/L (ref 98–110)
Creat: 0.89 mg/dL (ref 0.70–1.18)
Globulin: 2.8 g/dL (calc) (ref 1.9–3.7)
Glucose, Bld: 123 mg/dL — ABNORMAL HIGH (ref 65–99)
Potassium: 4.3 mmol/L (ref 3.5–5.3)
Sodium: 139 mmol/L (ref 135–146)
Total Bilirubin: 0.3 mg/dL (ref 0.2–1.2)
Total Protein: 6.9 g/dL (ref 6.1–8.1)

## 2020-01-08 LAB — CBC WITH DIFFERENTIAL/PLATELET
Absolute Monocytes: 624 cells/uL (ref 200–950)
Basophils Absolute: 31 cells/uL (ref 0–200)
Basophils Relative: 0.4 %
Eosinophils Absolute: 133 cells/uL (ref 15–500)
Eosinophils Relative: 1.7 %
HCT: 40.9 % (ref 38.5–50.0)
Hemoglobin: 14.1 g/dL (ref 13.2–17.1)
Lymphs Abs: 2621 cells/uL (ref 850–3900)
MCH: 30.5 pg (ref 27.0–33.0)
MCHC: 34.5 g/dL (ref 32.0–36.0)
MCV: 88.5 fL (ref 80.0–100.0)
MPV: 11.3 fL (ref 7.5–12.5)
Monocytes Relative: 8 %
Neutro Abs: 4391 cells/uL (ref 1500–7800)
Neutrophils Relative %: 56.3 %
Platelets: 238 10*3/uL (ref 140–400)
RBC: 4.62 10*6/uL (ref 4.20–5.80)
RDW: 12 % (ref 11.0–15.0)
Total Lymphocyte: 33.6 %
WBC: 7.8 10*3/uL (ref 3.8–10.8)

## 2020-01-08 LAB — HEMOGLOBIN A1C
Hgb A1c MFr Bld: 6.6 % of total Hgb — ABNORMAL HIGH (ref <5.7)
Mean Plasma Glucose: 143 (calc)
eAG (mmol/L): 7.9 (calc)

## 2020-01-08 LAB — LIPID PANEL
Cholesterol: 92 mg/dL (ref <200)
HDL: 39 mg/dL — ABNORMAL LOW (ref >=40)
LDL Cholesterol (Calc): 39 mg/dL (calc)
Non-HDL Cholesterol (Calc): 53 mg/dL (calc) (ref <130)
Total CHOL/HDL Ratio: 2.4 (calc) (ref <5.0)
Triglycerides: 68 mg/dL (ref <150)

## 2020-01-08 LAB — TEST AUTHORIZATION

## 2020-01-08 LAB — PSA: PSA: 1.1 ng/mL (ref <=4.0)

## 2020-01-08 NOTE — Progress Notes (Signed)
Subjective:    Patient ID: Paul Rubio, male    DOB: 01-Aug-1951, 69 y.o.   MRN: [id number]  HPI Patient is here today for complete physical exam. Immunizations are up-to-date. Immunization History  Administered Date(s) Administered  . Fluad Quad(high Dose 65+) 08/27/2019  . Influenza, High Dose Seasonal PF 09/12/2018  . Influenza,inj,Quad PF,6+ Mos 10/21/2013, 09/28/2014, 12/27/2015, 09/27/2016, 10/01/2017  . Pneumococcal Conjugate-13 12/27/2015  . Pneumococcal Polysaccharide-23 12/17/2014  . Td 11/21/2012  . Tdap 11/21/2012  . Zoster Recombinat (Shingrix) 09/06/2019, 11/15/2019  Most recent labs are listed below: Lab on 01/05/2020  Component Date Value Ref Range Status  . Hgb A1c MFr Bld 01/05/2020 6.6* <5.7 % of total Hgb Final   Comment: For someone without known diabetes, a hemoglobin A1c value of 6.5% or greater indicates that they may have  diabetes and this should be confirmed with a follow-up  test. . For someone with known diabetes, a value <7% indicates  that their diabetes is well controlled and a value  greater than or equal to 7% indicates suboptimal  control. A1c targets should be individualized based on  duration of diabetes, age, comorbid conditions, and  other considerations. . Currently, no consensus exists regarding use of hemoglobin A1c for diagnosis of diabetes for children. .   . Mean Plasma Glucose 01/05/2020 143  (calc) Final  . eAG (mmol/L) 01/05/2020 7.9  (calc) Final  . Cholesterol 01/05/2020 92  <200 mg/dL Final  . HDL 01/05/2020 39* > OR = 40 mg/dL Final  . Triglycerides 01/05/2020 68  <150 mg/dL Final  . LDL Cholesterol (Calc) 01/05/2020 39  mg/dL (calc) Final   Comment: Reference range: <100 . Desirable range <100 mg/dL for primary prevention;   <70 mg/dL for patients with CHD or diabetic patients  with > or = 2 CHD risk factors. Marland Kitchen LDL-C is now calculated using the Martin-Hopkins  calculation, which is a validated novel method  providing  better accuracy than the Friedewald equation in the  estimation of LDL-C.  Cresenciano Genre et al. Annamaria Helling. 7829;562(13): 2061-2068  (http://education.[facility].com/faq/FAQ164)   . Total CHOL/HDL Ratio 01/05/2020 2.4  <5.0 (calc) Final  . Non-HDL Cholesterol (Calc) 01/05/2020 53  <130 mg/dL (calc) Final   Comment: For patients with diabetes plus 1 major ASCVD risk  factor, treating to a non-HDL-C goal of <100 mg/dL  (LDL-C of <70 mg/dL) is considered a therapeutic  option.   . Glucose, Bld 01/05/2020 123* 65 - 99 mg/dL Final   Comment: .            Fasting reference interval . For someone without known diabetes, a glucose value between 100 and 125 mg/dL is consistent with prediabetes and should be confirmed with a follow-up test. .   . BUN 01/05/2020 25  7 - 25 mg/dL Final  . Creat 01/05/2020 0.89  0.70 - 1.18 mg/dL Final   Comment: For patients >26 years of age, the reference limit for Creatinine is approximately 13% higher for people identified as African-American. .   Havery Moros Ratio 08/65/7846 NOT APPLICABLE  6 - 22 (calc) Final  . Sodium 01/05/2020 139  135 - 146 mmol/L Final  . Potassium 01/05/2020 4.3  3.5 - 5.3 mmol/L Final  . Chloride 01/05/2020 105  98 - 110 mmol/L Final  . CO2 01/05/2020 26  20 - 32 mmol/L Final  . Calcium 01/05/2020 9.1  8.6 - 10.3 mg/dL Final  . Total Protein 01/05/2020 6.9  6.1 - 8.1 g/dL Final  .  Albumin 01/05/2020 4.1  3.6 - 5.1 g/dL Final  . Globulin 41/28/7867 2.8  1.9 - 3.7 g/dL (calc) Final  . AG Ratio 01/05/2020 1.5  1.0 - 2.5 (calc) Final  . Total Bilirubin 01/05/2020 0.3  0.2 - 1.2 mg/dL Final  . Alkaline phosphatase (APISO) 01/05/2020 53  35 - 144 U/L Final  . AST 01/05/2020 21  10 - 35 U/L Final  . ALT 01/05/2020 24  9 - 46 U/L Final  . WBC 01/05/2020 7.8  3.8 - 10.8 Thousand/uL Final  . RBC 01/05/2020 4.62  4.20 - 5.80 Million/uL Final  . Hemoglobin 01/05/2020 14.1  13.2 - 17.1 g/dL Final  . HCT 67/20/9470 40.9   38.5 - 50.0 % Final  . MCV 01/05/2020 88.5  80.0 - 100.0 fL Final  . MCH 01/05/2020 30.5  27.0 - 33.0 pg Final  . MCHC 01/05/2020 34.5  32.0 - 36.0 g/dL Final  . RDW 96/28/3662 12.0  11.0 - 15.0 % Final  . Platelets 01/05/2020 238  140 - 400 Thousand/uL Final  . MPV 01/05/2020 11.3  7.5 - 12.5 fL Final  . Neutro Abs 01/05/2020 4,391  1,500 - 7,800 cells/uL Final  . Lymphs Abs 01/05/2020 2,621  850 - 3,900 cells/uL Final  . Absolute Monocytes 01/05/2020 624  200 - 950 cells/uL Final  . Eosinophils Absolute 01/05/2020 133  15 - 500 cells/uL Final  . Basophils Absolute 01/05/2020 31  0 - 200 cells/uL Final  . Neutrophils Relative % 01/05/2020 56.3  % Final  . Total Lymphocyte 01/05/2020 33.6  % Final  . Monocytes Relative 01/05/2020 8.0  % Final  . Eosinophils Relative 01/05/2020 1.7  % Final  . Basophils Relative 01/05/2020 0.4  % Final  . Creatinine, Urine 01/05/2020 191  20 - 320 mg/dL Final  . Microalb, Ur 94/76/5465 4.6  mg/dL Final   Comment: Reference Range Not established   . Microalb Creat Ratio 01/05/2020 24  <30 mcg/mg creat Final   Comment: . The ADA defines abnormalities in albumin excretion as follows: Marland Kitchen Category         Result (mcg/mg creatinine) . Normal                    <30 Microalbuminuria         30-299  Clinical albuminuria   > OR = 300 . The ADA recommends that at least two of three specimens collected within a 3-6 month period be abnormal before considering a patient to be within a diagnostic category.   Patient denies any depression.  He denies any falls.  He does have some mild memory loss.  For instance he will walk into the shop and forget what he walked to his shop in the first place.  He also mention losing paperwork because he put it on the-of his truck and he forgot to check there again.  He also seems to have some mild word finding difficulties as we talked.  He will frequently pause midsentence trying to think of what he wants to say next however  he denies any significant memory loss.  He denies forgetting conversations, forgetting people's names, getting lost driving etc.  He denies any chest pain or shortness of breath or dyspnea on exertion.  Diabetic foot exam is normal today except for some mild decrease sensation in the big toe bilaterally.  He also has a hammertoe on his right second toe.  Otherwise his diabetic foot exam is normal.  He is already  scheduled his eye exam for later this year.  His wife is concerned because he does have diminished hearing.  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  He would like his hearing checked today.  He also reports decreased taste.  He states that food does not taste as rich as it once did.  Otherwise he is doing well with no concerns. Past Medical History:  Diagnosis Date  . Coronary artery disease 2011    2 stents placed  . H/O hiatal hernia   . Hyperlipidemia    borderline; status post stress test x2, the last one 5-6 years ago and without significant abnormality per the patient  . Hypertension    for approximately 5 years  . Myocardial infarction (HCC) 2011  . Prediabetes    Past Surgical History:  Procedure Laterality Date  . CHOLECYSTECTOMY    . COLONOSCOPY    . CORONARY ANGIOPLASTY     DES to mid and distal RCA 05/15/10  . ROTATOR CUFF REPAIR Left 10/20/2013   Dr August Saucer  . SHOULDER ARTHROSCOPY WITH OPEN ROTATOR CUFF REPAIR Left 10/20/2013   Procedure: SHOULDER ARTHROSCOPY WITH OPEN ROTATOR CUFF REPAIR;  Surgeon: Cammy Copa, MD;  Location: [facility] OR;  Service: Orthopedics;  Laterality: Left;  Left shoulder arthroscopy, debridement, open biceps tenodesis and rotator cuff repair.   Current Outpatient Medications on File Prior to Visit  Medication Sig Dispense Refill  . Ascorbic Acid (VITAMIN C PO) Take 1 tablet daily by mouth.    Marland Kitchen aspirin EC 81 MG tablet Take 81 mg by mouth daily.    Marland Kitchen atorvastatin (LIPITOR) 40  MG tablet Take 1 tablet (40 mg total) by mouth daily at 6 PM. 90 tablet 3  . Cholecalciferol (VITAMIN D) 2000 UNITS tablet Take 2,000 Units by mouth 2 (two) times daily.    . fexofenadine (ALLEGRA) 180 MG tablet Take 180 mg by mouth daily.    . fluticasone (FLONASE) 50 MCG/ACT nasal spray Place 2 sprays into both nostrils daily. 16 g 6  . levocetirizine (XYZAL) 5 MG tablet TAKE 1 TABLET(5 MG) BY MOUTH EVERY EVENING 90 tablet 1  . losartan (COZAAR) 50 MG tablet Take 1 tablet (50 mg total) by mouth daily. 90 tablet 3  . metFORMIN (GLUCOPHAGE) 850 MG tablet TAKE 1 TABLET BY MOUTH TWICE DAILY THEREAFTER 60 tablet 3  . metoprolol tartrate (LOPRESSOR) 25 MG tablet Take 0.5 tablets (12.5 mg total) by mouth 2 (two) times daily. 90 tablet 3  . Multiple Vitamin (MULTIVITAMIN WITH MINERALS) TABS tablet Take 1 tablet by mouth daily.    . nitroGLYCERIN (NITROSTAT) 0.4 MG SL tablet Place 1 tablet (0.4 mg total) under the tongue every 5 (five) minutes as needed for chest pain. 25 tablet 5   No current facility-administered medications on file prior to visit.   No Known Allergies Social History   Socioeconomic History  . Marital status: Married    Spouse name: Not on file  . Number of children: 2  . Years of education: Not on file  . Highest education level: Not on file  Occupational History  . Not on file  Tobacco Use  . Smoking status: Former Smoker    Types: Cigarettes    Quit date: 12/03/1968    Years since quitting: 51.1  . Smokeless tobacco: Never Used  Substance and Sexual Activity  . Alcohol use: No  . Drug use: No  . Sexual activity:  Not on file  Other Topics Concern  . Not on file  Social History Narrative   Quit tobacco greater than 20 years ago with approximately 10 pack-year history. Denies alcohol or drug abuse   Social Determinants of Health   Financial Resource Strain:   . Difficulty of Paying Living Expenses: Not on file  Food Insecurity:   . Worried About Brewing technologist in the Last Year: Not on file  . Ran Out of Food in the Last Year: Not on file  Transportation Needs:   . Lack of Transportation (Medical): Not on file  . Lack of Transportation (Non-Medical): Not on file  Physical Activity:   . Days of Exercise per Week: Not on file  . Minutes of Exercise per Session: Not on file  Stress:   . Feeling of Stress : Not on file  Social Connections:   . Frequency of Communication with Friends and Family: Not on file  . Frequency of Social Gatherings with Friends and Family: Not on file  . Attends Religious Services: Not on file  . Active Member of Clubs or Organizations: Not on file  . Attends Banker Meetings: Not on file  . Marital Status: Not on file  Intimate Partner Violence:   . Fear of Current or Ex-Partner: Not on file  . Emotionally Abused: Not on file  . Physically Abused: Not on file  . Sexually Abused: Not on file   Family History  Problem Relation Age of Onset  . Heart disease Father   . Heart attack Other   . Sudden death Paternal Uncle        possible MI  . Colon cancer Neg Hx      Review of Systems  All other systems reviewed and are negative.      Objective:   Physical Exam  Constitutional: He is oriented to person, place, and time. He appears well-developed and well-nourished. No distress.  HENT:  Head: Normocephalic and atraumatic.  Right Ear: External ear normal.  Left Ear: External ear normal.  Nose: Nose normal.  Mouth/Throat: Oropharynx is clear and moist. No oropharyngeal exudate.  Eyes: Pupils are equal, round, and reactive to light. Conjunctivae and EOM are normal. Right eye exhibits no discharge. Left eye exhibits no discharge. No scleral icterus.  Neck: No JVD present. No tracheal deviation present. No thyromegaly present.  Cardiovascular: Normal rate, regular rhythm, normal heart sounds and intact distal pulses. Exam reveals no gallop and no friction rub.  No murmur heard. Pulmonary/Chest:  Effort normal and breath sounds normal. No stridor. No respiratory distress. He has no wheezes. He has no rales. He exhibits no tenderness.  Abdominal: Soft. Bowel sounds are normal. He exhibits no distension and no mass. There is no abdominal tenderness. There is no rebound and no guarding.  Musculoskeletal:        General: No tenderness or edema. Normal range of motion.     Cervical back: Normal range of motion and neck supple.  Lymphadenopathy:    He has no cervical adenopathy.  Neurological: He is alert and oriented to person, place, and time. He has normal reflexes. No cranial nerve deficit. He exhibits normal muscle tone. Coordination normal.  Skin: Skin is warm. No rash noted. He is not diaphoretic.  Psychiatric: He has a normal mood and affect. His behavior is normal. Judgment and thought content normal.  Vitals reviewed.         Assessment & Plan:  Controlled type 2  diabetes mellitus without complication, without long-term current use of insulin (HCC)  Benign essential HTN  Pure hypercholesterolemia  Screening PSA (prostate specific antigen)  General medical exam  Hemoglobin A1c is well controlled at 6.6.  I am very happy with his.  LDL cholesterol is well below 70 which is his goal given his history of coronary artery disease.  His blood pressure today is well controlled at 136/80.  The remainder of his CMP and CBC are completely normal.  I will add a PSA to screen for prostate cancer.  His colonoscopy was performed in 2019 and was completely clear.  They recommended a repeat colonoscopy in 2028.  The patient denies any falls or depression or significant memory loss.  His immunizations are up-to-date.  The remainder of his preventative care is up-to-date.  Hearing screen was performed today and does show age-related hearing loss but at the present time the patient declines any hearing aids.

## 2020-04-14 ENCOUNTER — Ambulatory Visit (INDEPENDENT_AMBULATORY_CARE_PROVIDER_SITE_OTHER): Payer: Medicare Other | Admitting: Family Medicine

## 2020-04-14 ENCOUNTER — Encounter: Payer: Self-pay | Admitting: Family Medicine

## 2020-04-14 ENCOUNTER — Other Ambulatory Visit: Payer: Self-pay

## 2020-04-14 VITALS — BP 128/68 | HR 78 | Temp 97.2°F | Resp 18 | Ht 68.0 in | Wt 214.0 lb

## 2020-04-14 DIAGNOSIS — R432 Parageusia: Secondary | ICD-10-CM | POA: Diagnosis not present

## 2020-04-14 DIAGNOSIS — E119 Type 2 diabetes mellitus without complications: Secondary | ICD-10-CM | POA: Diagnosis not present

## 2020-04-14 MED ORDER — FLUTICASONE PROPIONATE 50 MCG/ACT NA SUSP: 2.0000 | Freq: Every day | NASAL | 6 refills | Status: DC

## 2020-04-14 NOTE — Progress Notes (Signed)
Subjective:    Patient ID: Paul Rubio, male    DOB: Sep 26, 1951, 69 y.o.   MRN: [id number]  HPI  Patient is a very pleasant 69 year old Caucasian male here today complaining of trouble tasting meat.  He states that over the last 3 to 4 months, the taste of beef and pork has become very metallic like and displeasing.  He does not want to eat beef or pork due to the metallic dysgeusia.  He has not added any new medication over the last 3 to 4 months.  I did review all of his medications and check this against the registry of medications known to cause dyschezia.  He is not on any of those medications.  He denies any sinus infections.  Although timewise this does match up with allergy season, he denies any rhinorrhea or head congestion or sinus pressure.  His nasal passages are completely clear and there are no nasal polyps seen.  He has recently been seeing the dentist due to gingivitis per his report.  Gingivitis is certainly listed as a potential cause however visual inspection of his mouth reveals no obvious gum inflammation.  There is residual tobacco debris in the crevices between his teeth.  However there is no evidence of thrush or yeast infection.  The patient is chewing tobacco and has been for quite some time.  There is no palpable masses in his submandibular glands or his parotid glands.  There is no lymphadenopathy in the neck and no evidence of any salivary gland inflammation.  He denies any history of B12 or zinc deficiency.  He does have a history of diabetes. Past Medical History:  Diagnosis Date  . Coronary artery disease 2011    2 stents placed  . H/O hiatal hernia   . Hyperlipidemia    borderline; status post stress test x2, the last one 5-6 years ago and without significant abnormality per the patient  . Hypertension    for approximately 5 years  . Myocardial infarction ([place]) 2011  . Prediabetes    Past Surgical History:  Procedure Laterality Date  . CHOLECYSTECTOMY    .  COLONOSCOPY    . CORONARY ANGIOPLASTY     DES to mid and distal RCA 05/15/10  . ROTATOR CUFF REPAIR Left 10/20/2013   Dr Marlou Sa  . SHOULDER ARTHROSCOPY WITH OPEN ROTATOR CUFF REPAIR Left 10/20/2013   Procedure: SHOULDER ARTHROSCOPY WITH OPEN ROTATOR CUFF REPAIR;  Surgeon: Meredith Pel, MD;  Location: [facility];  Service: Orthopedics;  Laterality: Left;  Left shoulder arthroscopy, debridement, open biceps tenodesis and rotator cuff repair.   Current Outpatient Medications on File Prior to Visit  Medication Sig Dispense Refill  . Ascorbic Acid (VITAMIN C PO) Take 1 tablet daily by mouth.    Marland Kitchen aspirin EC 81 MG tablet Take 81 mg by mouth daily.    Marland Kitchen atorvastatin (LIPITOR) 40 MG tablet Take 1 tablet (40 mg total) by mouth daily at 6 PM. 90 tablet 3  . Cholecalciferol (VITAMIN D) 2000 UNITS tablet Take 2,000 Units by mouth 2 (two) times daily.    Marland Kitchen losartan (COZAAR) 50 MG tablet Take 1 tablet (50 mg total) by mouth daily. 90 tablet 3  . metFORMIN (GLUCOPHAGE) 850 MG tablet TAKE 1 TABLET BY MOUTH TWICE DAILY THEREAFTER 60 tablet 3  . metoprolol tartrate (LOPRESSOR) 25 MG tablet Take 0.5 tablets (12.5 mg total) by mouth 2 (two) times daily. 90 tablet 3  . Multiple Vitamin (MULTIVITAMIN WITH MINERALS) TABS tablet  Take 1 tablet by mouth daily.    . nitroGLYCERIN (NITROSTAT) 0.4 MG SL tablet Place 1 tablet (0.4 mg total) under the tongue every 5 (five) minutes as needed for chest pain. 25 tablet 5   No current facility-administered medications on file prior to visit.   No Known Allergies Social History   Socioeconomic History  . Marital status: Married    Spouse name: Not on file  . Number of children: 2  . Years of education: Not on file  . Highest education level: Not on file  Occupational History  . Not on file  Tobacco Use  . Smoking status: Former Smoker    Types: Cigarettes    Quit date: 12/03/1968    Years since quitting: 51.3  . Smokeless tobacco: Never Used  Substance and Sexual  Activity  . Alcohol use: No  . Drug use: No  . Sexual activity: Not on file  Other Topics Concern  . Not on file  Social History Narrative   Quit tobacco greater than 20 years ago with approximately 10 pack-year history. Denies alcohol or drug abuse   Social Determinants of Health   Financial Resource Strain:   . Difficulty of Paying Living Expenses:   Food Insecurity:   . Worried About Programme researcher, broadcasting/film/video in the Last Year:   . Barista in the Last Year:   Transportation Needs:   . Freight forwarder (Medical):   Marland Kitchen Lack of Transportation (Non-Medical):   Physical Activity:   . Days of Exercise per Week:   . Minutes of Exercise per Session:   Stress:   . Feeling of Stress :   Social Connections:   . Frequency of Communication with Friends and Family:   . Frequency of Social Gatherings with Friends and Family:   . Attends Religious Services:   . Active Member of Clubs or Organizations:   . Attends Banker Meetings:   Marland Kitchen Marital Status:   Intimate Partner Violence:   . Fear of Current or Ex-Partner:   . Emotionally Abused:   Marland Kitchen Physically Abused:   . Sexually Abused:        Review of Systems  All other systems reviewed and are negative.      Objective:   Physical Exam Vitals reviewed.  Constitutional:      General: He is not in acute distress.    Appearance: Normal appearance. He is obese. He is not ill-appearing, toxic-appearing or diaphoretic.  HENT:     Right Ear: Tympanic membrane and ear canal normal.     Left Ear: Tympanic membrane and ear canal normal.     Nose: Nose normal. No congestion or rhinorrhea.     Mouth/Throat:     Mouth: Mucous membranes are moist.     Pharynx: Oropharynx is clear. No oropharyngeal exudate or posterior oropharyngeal erythema.  Eyes:     Extraocular Movements: Extraocular movements intact.     Conjunctiva/sclera: Conjunctivae normal.     Pupils: Pupils are equal, round, and reactive to light.    Cardiovascular:     Rate and Rhythm: Normal rate and regular rhythm.  Pulmonary:     Effort: Pulmonary effort is normal.     Breath sounds: Normal breath sounds.  Musculoskeletal:     Cervical back: Neck supple. No tenderness.  Lymphadenopathy:     Cervical: No cervical adenopathy.  Neurological:     Mental Status: He is alert.  Assessment & Plan:  Controlled type 2 diabetes mellitus without complication, without long-term current use of insulin (HCC) - Plan: Hemoglobin A1c, COMPLETE METABOLIC PANEL WITH GFR  Dysgeusia - Plan: Vitamin B12, Zinc, fluticasone (FLONASE) 50 MCG/ACT nasal spray  Differential diagnosis for metallic dyschezia include medications, sinus inflammation/sinusitis, toxins such as tobacco abuse or heavy metal toxicity, Sjogren's disease, zinc deficiency, B12 deficiency, nerve damage due to diabetes, oral infection such as gingivitis.  His history and exam are completely normal.  We will empirically try Flonase for possible sinusitis.  I will check for B12 and zinc deficiency.  I will monitor hemoglobin A1c to ensure that his diabetes is well controlled.  I reviewed the medication registry and none of his medicines are listed as potential causes of dyschezia.  Therefore I believe this is multifactorial and likely due to a combination of tobacco abuse, allergies, age, and potentially nerve and taste damage due to age.  We will try Flonase and I have recommended cessation of all tobacco products

## 2020-04-16 LAB — HEMOGLOBIN A1C
Hgb A1c MFr Bld: 6.3 % of total Hgb — ABNORMAL HIGH (ref <5.7)
Mean Plasma Glucose: 134 (calc)
eAG (mmol/L): 7.4 (calc)

## 2020-04-16 LAB — COMPLETE METABOLIC PANEL WITH GFR
AG Ratio: 1.6 (calc) (ref 1.0–2.5)
ALT: 23 U/L (ref 9–46)
AST: 19 U/L (ref 10–35)
Albumin: 4.2 g/dL (ref 3.6–5.1)
Alkaline phosphatase (APISO): 53 U/L (ref 35–144)
BUN: 21 mg/dL (ref 7–25)
CO2: 29 mmol/L (ref 20–32)
Calcium: 9.3 mg/dL (ref 8.6–10.3)
Chloride: 104 mmol/L (ref 98–110)
Creat: 0.92 mg/dL (ref 0.70–1.18)
GFR, Est African American: 97 mL/min/{1.73_m2} (ref >=60)
GFR, Est Non African American: 83 mL/min/{1.73_m2} (ref >=60)
Globulin: 2.6 g/dL (calc) (ref 1.9–3.7)
Glucose, Bld: 142 mg/dL — ABNORMAL HIGH (ref 65–99)
Potassium: 4.8 mmol/L (ref 3.5–5.3)
Sodium: 138 mmol/L (ref 135–146)
Total Bilirubin: 0.3 mg/dL (ref 0.2–1.2)
Total Protein: 6.8 g/dL (ref 6.1–8.1)

## 2020-04-16 LAB — ZINC: Zinc: 63 ug/dL (ref 60–130)

## 2020-04-16 LAB — VITAMIN B12: Vitamin B-12: 511 pg/mL (ref 200–1100)

## 2020-04-17 ENCOUNTER — Other Ambulatory Visit: Payer: Self-pay | Admitting: Family Medicine

## 2020-04-18 DIAGNOSIS — Z0120 Encounter for dental examination and cleaning without abnormal findings: Secondary | ICD-10-CM | POA: Diagnosis not present

## 2020-05-03 ENCOUNTER — Encounter: Payer: Self-pay | Admitting: Family Medicine

## 2020-06-14 DIAGNOSIS — L814 Other melanin hyperpigmentation: Secondary | ICD-10-CM | POA: Diagnosis not present

## 2020-06-14 DIAGNOSIS — Z85828 Personal history of other malignant neoplasm of skin: Secondary | ICD-10-CM | POA: Diagnosis not present

## 2020-06-14 DIAGNOSIS — D1801 Hemangioma of skin and subcutaneous tissue: Secondary | ICD-10-CM | POA: Diagnosis not present

## 2020-06-14 DIAGNOSIS — L812 Freckles: Secondary | ICD-10-CM | POA: Diagnosis not present

## 2020-08-19 ENCOUNTER — Other Ambulatory Visit: Payer: Self-pay | Admitting: Family Medicine

## 2020-09-14 ENCOUNTER — Ambulatory Visit (INDEPENDENT_AMBULATORY_CARE_PROVIDER_SITE_OTHER): Payer: Medicare Other

## 2020-09-14 ENCOUNTER — Other Ambulatory Visit: Payer: Self-pay

## 2020-09-14 DIAGNOSIS — Z23 Encounter for immunization: Secondary | ICD-10-CM | POA: Diagnosis not present

## 2020-10-13 ENCOUNTER — Ambulatory Visit: Payer: Medicare Other | Admitting: Orthopedic Surgery

## 2020-10-14 ENCOUNTER — Ambulatory Visit (INDEPENDENT_AMBULATORY_CARE_PROVIDER_SITE_OTHER): Payer: Medicare Other | Admitting: Orthopedic Surgery

## 2020-10-14 ENCOUNTER — Encounter: Payer: Self-pay | Admitting: Orthopedic Surgery

## 2020-10-14 ENCOUNTER — Ambulatory Visit: Payer: Self-pay

## 2020-10-14 DIAGNOSIS — M25511 Pain in right shoulder: Secondary | ICD-10-CM

## 2020-10-14 DIAGNOSIS — M12811 Other specific arthropathies, not elsewhere classified, right shoulder: Secondary | ICD-10-CM

## 2020-10-14 NOTE — Progress Notes (Signed)
Office Visit Note   Patient: Paul Rubio           Date of Birth: 1951/04/25           MRN: [id number] Visit Date: 10/14/2020 Requested by: Donita Brooks, MD [address] [place],  [place] [address] PCP: Donita Brooks, MD  Subjective: Chief Complaint  Patient presents with  . Right Shoulder - Pain    HPI: Paul Rubio is a 69 year old patient with right shoulder pain.  MRI scan 2019 showed early rotator cuff arthropathy with retracted tear of the supraspinatus tendon with atrophy along with significant tearing of the infraspinatus and tendinosis of the subscapularis.  Patient reports continued pain with movement of the right shoulder.  His functional ability remains relatively intact but does have pain more or less all the time with the shoulder even at rest as well as with activity.  He has done well with left shoulder rotator cuff repair done years ago.              ROS: All systems reviewed are negative as they relate to the chief complaint within the history of present illness.  Patient denies  fevers or chills.   Assessment & Plan: Visit Diagnoses:  1. Right shoulder pain, unspecified chronicity     Plan: Impression is right shoulder rotator cuff arthropathy.  After long discussion about risk benefits of operative and nonoperative options he has elected to proceed with reverse shoulder replacement primarily for pain control.  I do not think that the replacement will help his function as much at as it will help his pain.  Risk benefits of the surgery discussed include not limited to infection nerve vessel damage dislocation potential need for revision surgery.  Rationale behind CT scanning and patient specific instrumentation discussed.  Models used for assistance in concept understanding.  Patient understands risk benefits and wishes to proceed.  All questions answered  Follow-Up Instructions: No follow-ups on file.   Orders:  Orders Placed This Encounter    Procedures  . XR Shoulder Right   No orders of the defined types were placed in this encounter.     Procedures: No procedures performed   Clinical Data: No additional findings.  Objective: Vital Signs: There were no vitals taken for this visit.  Physical Exam:   Constitutional: Patient appears well-developed HEENT:  Head: Normocephalic Eyes:EOM are normal Neck: Normal range of motion Cardiovascular: Normal rate Pulmonary/chest: Effort normal Neurologic: Patient is alert Skin: Skin is warm Psychiatric: Patient has normal mood and affect    Ortho Exam: Ortho exam demonstrates 4 out of 5 weakness to infraspinatus testing right versus left.  Subscap strength 5- out of 5 on the right 5+ out of 5 on the left.  Does have some coarse grinding and crepitus but overall forward flexion abduction both above 90 degrees.  External rotation of 15 degrees of abduction passively is to about 50.  Axillary nerve is functional.  Motor sensory function of the hand is intact.  Skin intact in the right shoulder region.  Specialty Comments:  No specialty comments available.  Imaging: XR Shoulder Right  Result Date: 10/14/2020 AP outlet and axillary right shoulder reviewed.  There is narrowing of the acromiohumeral distance.  Glenohumeral articular surface is intact.  No acute fracture.  Mild degenerative AC joint changes.  Visualized lung fields clear.    PMFS History: Patient Active Problem List   Diagnosis Date Noted  . Type 2 diabetes mellitus  with complication, without long-term current use of insulin (HCC)   . Acute sinusitis 02/10/2014  . Acute bronchitis 02/10/2014  . ESSENTIAL HYPERTENSION, BENIGN 07/25/2010  . Mixed hyperlipidemia 05/31/2010  . CAD, NATIVE VESSEL 05/31/2010  . FATIGUE / MALAISE 05/31/2010  . HYPERTENSION, HX OF 05/31/2010   Past Medical History:  Diagnosis Date  . Coronary artery disease 2011    2 stents placed  . H/O hiatal hernia   . Hyperlipidemia     borderline; status post stress test x2, the last one 5-6 years ago and without significant abnormality per the patient  . Hypertension    for approximately 5 years  . Myocardial infarction (HCC) 2011  . Prediabetes     Family History  Problem Relation Age of Onset  . Heart disease Father   . Heart attack Other   . Sudden death Paternal Uncle        possible MI  . Colon cancer Neg Hx     Past Surgical History:  Procedure Laterality Date  . CHOLECYSTECTOMY    . COLONOSCOPY    . CORONARY ANGIOPLASTY     DES to mid and distal RCA 05/15/10  . ROTATOR CUFF REPAIR Left 10/20/2013   Dr August Saucer  . SHOULDER ARTHROSCOPY WITH OPEN ROTATOR CUFF REPAIR Left 10/20/2013   Procedure: SHOULDER ARTHROSCOPY WITH OPEN ROTATOR CUFF REPAIR;  Surgeon: Cammy Copa, MD;  Location: [facility] OR;  Service: Orthopedics;  Laterality: Left;  Left shoulder arthroscopy, debridement, open biceps tenodesis and rotator cuff repair.   Social History   Occupational History  . Not on file  Tobacco Use  . Smoking status: Former Smoker    Types: Cigarettes    Quit date: 12/03/1968    Years since quitting: 51.8  . Smokeless tobacco: Never Used  Vaping Use  . Vaping Use: Never used  Substance and Sexual Activity  . Alcohol use: No  . Drug use: No  . Sexual activity: Not on file

## 2020-10-14 NOTE — Addendum Note (Signed)
Addended byPrescott Parma on: 10/14/2020 12:08 PM   Modules accepted: Orders

## 2020-10-26 ENCOUNTER — Encounter: Payer: Self-pay | Admitting: Physician Assistant

## 2020-10-26 ENCOUNTER — Ambulatory Visit: Payer: Medicare Other | Admitting: Physician Assistant

## 2020-10-26 ENCOUNTER — Other Ambulatory Visit: Payer: Self-pay

## 2020-10-26 VITALS — BP 126/78 | HR 63 | Ht 68.0 in | Wt 212.0 lb

## 2020-10-26 DIAGNOSIS — I10 Essential (primary) hypertension: Secondary | ICD-10-CM | POA: Diagnosis not present

## 2020-10-26 DIAGNOSIS — E785 Hyperlipidemia, unspecified: Secondary | ICD-10-CM

## 2020-10-26 DIAGNOSIS — I2510 Atherosclerotic heart disease of native coronary artery without angina pectoris: Secondary | ICD-10-CM

## 2020-10-26 DIAGNOSIS — E119 Type 2 diabetes mellitus without complications: Secondary | ICD-10-CM

## 2020-10-26 MED ORDER — LOSARTAN POTASSIUM 50 MG PO TABS
50.0000 mg | ORAL_TABLET | Freq: Every day | ORAL | 3 refills | Status: DC
Start: 2020-10-26 — End: 2020-10-31

## 2020-10-26 MED ORDER — ATORVASTATIN CALCIUM 40 MG PO TABS: 40.0000 mg | ORAL_TABLET | Freq: Every day | ORAL | 3 refills | Status: DC

## 2020-10-26 MED ORDER — METOPROLOL TARTRATE 25 MG PO TABS
12.5000 mg | ORAL_TABLET | Freq: Two times a day (BID) | ORAL | 3 refills | Status: DC
Start: 2020-10-26 — End: 2020-10-31

## 2020-10-26 NOTE — Patient Instructions (Signed)

## 2020-10-26 NOTE — Progress Notes (Signed)
Cardiology Office Note:    Date:  10/26/2020   ID:  Paul Rubio, DOB [birth year], MRN [id number]  PCP:  Donita Brooks, MD  [facility] Cardiologist:  Tonny Bollman, MD  [facility] Electrophysiologist:  None   Chief Complaint: yearly follow up   History of Present Illness:    Paul Rubio is a 69 y.o. male with a hx of CAD s/p DES x2 to mid and distal RCA in 2011, hypertension, hyperlipidemia, and diabetes presents for follow up.   Last seen by APP 10/2019.  Here today for follow up.  Patient is very active at baseline.  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  No exertional limitation.  Denies chest pain, shortness of breath, orthopnea, PND, syncope, lower extremity edema or melena.  Compliant with medications.  Past Medical History:  Diagnosis Date  . Coronary artery disease 2011    2 stents placed  . H/O hiatal hernia   . Hyperlipidemia    borderline; status post stress test x2, the last one 5-6 years ago and without significant abnormality per the patient  . Hypertension    for approximately 5 years  . Myocardial infarction (HCC) 2011  . Prediabetes     Past Surgical History:  Procedure Laterality Date  . CHOLECYSTECTOMY    . COLONOSCOPY    . CORONARY ANGIOPLASTY     DES to mid and distal RCA 05/15/10  . ROTATOR CUFF REPAIR Left 10/20/2013   Dr August Saucer  . SHOULDER ARTHROSCOPY WITH OPEN ROTATOR CUFF REPAIR Left 10/20/2013   Procedure: SHOULDER ARTHROSCOPY WITH OPEN ROTATOR CUFF REPAIR;  Surgeon: Cammy Copa, MD;  Location: [facility] OR;  Service: Orthopedics;  Laterality: Left;  Left shoulder arthroscopy, debridement, open biceps tenodesis and rotator cuff repair.    Current Medications: Current Meds  Medication Sig  . Ascorbic Acid (VITAMIN C PO) Take 1 tablet daily by mouth.  Marland Kitchen aspirin EC 81 MG tablet Take 81 mg by mouth daily.  Marland Kitchen atorvastatin (LIPITOR) 40 MG tablet Take 1 tablet (40 mg total) by mouth daily  at 6 PM.  . Cholecalciferol (VITAMIN D) 2000 UNITS tablet Take 2,000 Units by mouth 2 (two) times daily.  . fluticasone (FLONASE) 50 MCG/ACT nasal spray Place 2 sprays into both nostrils daily.  Marland Kitchen losartan (COZAAR) 50 MG tablet Take 1 tablet (50 mg total) by mouth daily.  . metFORMIN (GLUCOPHAGE) 850 MG tablet TAKE 1 TABLET BY MOUTH TWICE DAILY THEREAFTER (Patient taking differently: Take 850 mg by mouth 2 (two) times daily with a meal. )  . metoprolol tartrate (LOPRESSOR) 25 MG tablet Take 0.5 tablets (12.5 mg total) by mouth 2 (two) times daily.  . Multiple Vitamin (MULTIVITAMIN WITH MINERALS) TABS tablet Take 1 tablet by mouth daily.  . nitroGLYCERIN (NITROSTAT) 0.4 MG SL tablet Place 1 tablet (0.4 mg total) under the tongue every 5 (five) minutes as needed for chest pain.  . [DISCONTINUED] atorvastatin (LIPITOR) 40 MG tablet Take 1 tablet (40 mg total) by mouth daily at 6 PM.  . [DISCONTINUED] losartan (COZAAR) 50 MG tablet Take 1 tablet (50 mg total) by mouth daily.  . [DISCONTINUED] metoprolol tartrate (LOPRESSOR) 25 MG tablet Take 0.5 tablets (12.5 mg total) by mouth 2 (two) times daily.     Allergies:   Patient has no known allergies.   Social History   Socioeconomic History  . Marital status: Married    Spouse name: Not  on file  . Number of children: 2  . Years of education: Not on file  . Highest education level: Not on file  Occupational History  . Not on file  Tobacco Use  . Smoking status: Former Smoker    Types: Cigarettes    Quit date: 12/03/1968    Years since quitting: 51.9  . Smokeless tobacco: Never Used  Vaping Use  . Vaping Use: Never used  Substance and Sexual Activity  . Alcohol use: No  . Drug use: No  . Sexual activity: Not on file  Other Topics Concern  . Not on file  Social History Narrative   Quit tobacco greater than 20 years ago with approximately 10 pack-year history. Denies alcohol or drug abuse   Social Determinants of Health   Financial  Resource Strain:   . Difficulty of Paying Living Expenses: Not on file  Food Insecurity:   . Worried About Programme researcher, broadcasting/film/video in the Last Year: Not on file  . Ran Out of Food in the Last Year: Not on file  Transportation Needs:   . Lack of Transportation (Medical): Not on file  . Lack of Transportation (Non-Medical): Not on file  Physical Activity:   . Days of Exercise per Week: Not on file  . Minutes of Exercise per Session: Not on file  Stress:   . Feeling of Stress : Not on file  Social Connections:   . Frequency of Communication with Friends and Family: Not on file  . Frequency of Social Gatherings with Friends and Family: Not on file  . Attends Religious Services: Not on file  . Active Member of Clubs or Organizations: Not on file  . Attends Banker Meetings: Not on file  . Marital Status: Not on file     Family History: The patient's family history includes Heart attack in an other family member; Heart disease in his father; Sudden death in his paternal uncle. There is no history of Colon cancer.   ROS:   Please see the history of present illness.    All other systems reviewed and are negative.   EKGs/Labs/Other Studies Reviewed:    The following studies were reviewed today: As summarized above  EKG:  EKG is ordered today.  The ekg ordered today demonstrates normal sinus rhythm at rate of 63 bpm  Recent Labs: 01/05/2020: Hemoglobin 14.1; Platelets 238 04/14/2020: ALT 23; BUN 21; Creat 0.92; Potassium 4.8; Sodium 138  Recent Lipid Panel    Component Value Date/Time   CHOL 92 01/05/2020 0830   TRIG 68 01/05/2020 0830   HDL 39 (L) 01/05/2020 0830   CHOLHDL 2.4 01/05/2020 0830   VLDL 13 12/28/2016 0830   LDLCALC 39 01/05/2020 0830     Physical Exam:    VS:  BP 126/78   Pulse 63   Ht 5\' 8"  (1.727 m)   Wt 212 lb (96.2 kg)   SpO2 95%   BMI 32.23 kg/m     Wt Readings from Last 3 Encounters:  10/26/20 212 lb (96.2 kg)  04/14/20 214 lb (97.1 kg)   01/08/20 212 lb (96.2 kg)     GEN: Well nourished, well developed in no acute distress HEENT: Normal NECK: No JVD; No carotid bruits LYMPHATICS: No lymphadenopathy CARDIAC:RRR, no murmurs, rubs, gallops RESPIRATORY:  Clear to auscultation without rales, wheezing or rhonchi  ABDOMEN: Soft, non-tender, non-distended MUSCULOSKELETAL:  No edema; No deformity  SKIN: Warm and dry NEUROLOGIC:  Alert and oriented x 3 PSYCHIATRIC:  Normal affect   ASSESSMENT AND PLAN:    1. CAD s/p DES x 2 to mid & dRCA No angina.  He has very active lifestyle.  Continue aspirin, statin and beta-blocker.  2. HTN Blood pressure stable and well-controlled on current medication.  No change.  3. HLD 01/05/2020: Cholesterol 92; HDL 39; LDL Cholesterol (Calc) 39; Triglycerides 68  -Continue Lipitor 40 mg daily  4. DM - Followed by PCP   Medication Adjustments/Labs and Tests Ordered: Current medicines are reviewed at length with the patient today.  Concerns regarding medicines are outlined above.  Orders Placed This Encounter  Procedures  . EKG 12-Lead   Meds ordered this encounter  Medications  . atorvastatin (LIPITOR) 40 MG tablet    Sig: Take 1 tablet (40 mg total) by mouth daily at 6 PM.    Dispense:  90 tablet    Refill:  3  . losartan (COZAAR) 50 MG tablet    Sig: Take 1 tablet (50 mg total) by mouth daily.    Dispense:  90 tablet    Refill:  3  . metoprolol tartrate (LOPRESSOR) 25 MG tablet    Sig: Take 0.5 tablets (12.5 mg total) by mouth 2 (two) times daily.    Dispense:  90 tablet    Refill:  3    Patient Instructions  Medication Instructions:  Your physician recommends that you continue on your current medications as directed. Please refer to the Current Medication list given to you today.  *If you need a refill on your cardiac medications before your next appointment, please call your pharmacy*   Lab Work: None ordered  If you have labs (blood work) drawn today and your  tests are completely normal, you will receive your results only by: Marland Kitchen MyChart Message (if you have MyChart) OR . A paper copy in the mail If you have any lab test that is abnormal or we need to change your treatment, we will call you to review the results.   Testing/Procedures: None ordered   Follow-Up: At [facility], you and your health needs are our priority.  As part of our continuing mission to provide you with exceptional heart care, we have created designated Provider Care Teams.  These Care Teams include your primary Cardiologist (physician) and Advanced Practice Providers (APPs -  Physician Assistants and Nurse Practitioners) who all work together to provide you with the care you need, when you need it.  We recommend signing up for the patient portal called "MyChart".  Sign up information is provided on this After Visit Summary.  MyChart is used to connect with patients for Virtual Visits (Telemedicine).  Patients are able to view lab/test results, encounter notes, upcoming appointments, etc.  Non-urgent messages can be sent to your provider as well.   To learn more about what you can do with MyChart, go to [email or web address].    Your next appointment:   12 month(s)  The format for your next appointment:   In Person  Provider:   You may see Tonny Bollman, MD or one of the following Advanced Practice Providers on your designated Care Team:    Tereso Newcomer, PA-C  Chelsea Aus, PA-C    Other Instructions      Signed, Manson Passey, [place]  10/26/2020 12:21 PM    [facility] for at least 10 to 12 hours/day.  Last farm

## 2020-10-29 ENCOUNTER — Other Ambulatory Visit: Payer: Self-pay | Admitting: Student

## 2020-10-29 DIAGNOSIS — I2510 Atherosclerotic heart disease of native coronary artery without angina pectoris: Secondary | ICD-10-CM

## 2020-11-01 ENCOUNTER — Ambulatory Visit
Admission: RE | Admit: 2020-11-01 | Discharge: 2020-11-01 | Disposition: A | Discharge status: Home / Self Care | Payer: Medicare Other | Source: Ambulatory Visit | Attending: Orthopedic Surgery | Admitting: Orthopedic Surgery

## 2020-11-01 DIAGNOSIS — S46011A Strain of muscle(s) and tendon(s) of the rotator cuff of right shoulder, initial encounter: Secondary | ICD-10-CM | POA: Diagnosis not present

## 2020-11-01 DIAGNOSIS — M12811 Other specific arthropathies, not elsewhere classified, right shoulder: Secondary | ICD-10-CM

## 2020-11-01 DIAGNOSIS — M25511 Pain in right shoulder: Secondary | ICD-10-CM

## 2020-11-01 DIAGNOSIS — Z01818 Encounter for other preprocedural examination: Secondary | ICD-10-CM | POA: Diagnosis not present

## 2020-11-29 NOTE — Progress Notes (Signed)
[id number] - [place], [place] - [address] [place] [address] Phone: [phone] Fax: [phone]  PRIM[facility]) ELECTRONIC - [place], [place] - [address] [place] [address] Phone: [phone] Fax: [phone]      Your procedure is scheduled on January 4  Report to [facility] Main Entrance "A" at 0530 A.M., and check in at the Admitting office.  Call this number if you have problems the morning of surgery:  [phone]  Call [phone] if you have any questions prior to your surgery date Monday-Friday 8am-4pm    Remember:  Do not eat after midnight the night before your surgery  You may drink clear liquids until 0430 am the morning of your surgery.   Clear liquids allowed are: Water, Non-Citrus Juices (without pulp), Carbonated Beverages, Clear Tea, Black Coffee Only, and Gatorade   Enhanced Recovery after Surgery for Orthopedics Enhanced Recovery after Surgery is a protocol used to improve the stress on your body and your recovery after surgery.  Patient Instructions  . The night before surgery:  o No food after midnight. ONLY clear liquids after midnight  . The day of surgery (if you have diabetes): o  o Drink ONE (1) small bottle of water by __0430 am___ the morning of surgery o This drink was given to you during your hospital  pre-op appointment visit.  o Nothing else to drink after completing the  Small bottle of water.         If you have questions, please contact your surgeon's office.     Take these medicines the morning of surgery with A SIP OF WATER  metoprolol tartrate (LOPRESSOR)   Follow your surgeon's instructions on when to stop Aspirin.  If no instructions were given by your surgeon then you will need to call the office to get those instructions.    As of today, STOP taking any Aspirin (unless otherwise  instructed by your surgeon) Aleve, Naproxen, Ibuprofen, Motrin, Advil, Goody's, BC's, all herbal medications, fish oil, and all vitamins.   WHAT DO I DO ABOUT MY DIABETES MEDICATION?   Marland Kitchen Do not take oral diabetes medicines (pills) the morning of surgery. metFORMIN (GLUCOPHAGE)   HOW TO MANAGE YOUR DIABETES BEFORE AND AFTER SURGERY  Why is it important to control my blood sugar before and after surgery? . Improving blood sugar levels before and after surgery helps healing and can limit problems. . A way of improving blood sugar control is eating a healthy diet by: o  Eating less sugar and carbohydrates o  Increasing activity/exercise o  Talking with your doctor about reaching your blood sugar goals . High blood sugars (greater than 180 mg/dL) can raise your risk of infections and slow your recovery, so you will need to focus on controlling your diabetes during the weeks before surgery. . Make sure that the doctor who takes care of your diabetes knows about your planned surgery including the date and location.  How do I manage my blood sugar before surgery? . Check your blood sugar at least 4 times a day, starting 2 days before surgery, to make sure that the level is not too high or low. . Check your blood sugar the morning of your surgery when you wake up and every 2 hours until you get to the Short Stay unit. o If your blood sugar is less than 70  mg/dL, you will need to treat for low blood sugar: - Do not take insulin. - Treat a low blood sugar (less than 70 mg/dL) with  cup of clear juice (cranberry or apple), 4 glucose tablets, OR glucose gel. - Recheck blood sugar in 15 minutes after treatment (to make sure it is greater than 70 mg/dL). If your blood sugar is not greater than 70 mg/dL on recheck, call [phone] for further instructions. . Report your blood sugar to the short stay nurse when you get to Short Stay.  . If you are admitted to the hospital after surgery: o Your blood  sugar will be checked by the staff and you will probably be given insulin after surgery (instead of oral diabetes medicines) to make sure you have good blood sugar levels. o The goal for blood sugar control after surgery is 80-180 mg/dL                      Do not wear jewelry            Do not wear lotions, powders, colognes, or deodorant.            Men may shave face and neck.            Do not bring valuables to the hospital.            [facility] is not responsible for any belongings or valuables.  Do NOT Smoke (Tobacco/Vaping) or drink Alcohol 24 hours prior to your procedure If you use a CPAP at night, you may bring all equipment for your overnight stay.   Contacts, glasses, dentures or bridgework may not be worn into surgery.      For patients admitted to the hospital, discharge time will be determined by your treatment team.   Patients discharged the day of surgery will not be allowed to drive home, and someone needs to stay with them for 24 hours.    Special instructions:   [facility]- Preparing For Surgery  Before surgery, you can play an important role. Because skin is not sterile, your skin needs to be as free of germs as possible. You can reduce the number of germs on your skin by washing with CHG (chlorahexidine gluconate) Soap before surgery.  CHG is an antiseptic cleaner which kills germs and bonds with the skin to continue killing germs even after washing.    Oral Hygiene is also important to reduce your risk of infection.  Remember - BRUSH YOUR TEETH THE MORNING OF SURGERY WITH YOUR REGULAR TOOTHPASTE  Please do not use if you have an allergy to CHG or antibacterial soaps. If your skin becomes reddened/irritated stop using the CHG.  Do not shave (including legs and underarms) for at least 48 hours prior to first CHG shower. It is OK to shave your face.  Please follow these instructions carefully.   1. Shower the NIGHT BEFORE SURGERY and the MORNING OF SURGERY  with CHG Soap.   2. If you chose to wash your hair, wash your hair first as usual with your normal shampoo.  3. After you shampoo, rinse your hair and body thoroughly to remove the shampoo.  4. Use CHG as you would any other liquid soap. You can apply CHG directly to the skin and wash gently with a scrungie or a clean washcloth.   5. Apply the CHG Soap to your body ONLY FROM THE NECK DOWN.  Do not use on open wounds or  open sores. Avoid contact with your eyes, ears, mouth and genitals (private parts). Wash Face and genitals (private parts)  with your normal soap.   6. Wash thoroughly, paying special attention to the area where your surgery will be performed.  7. Thoroughly rinse your body with warm water from the neck down.  8. DO NOT shower/wash with your normal soap after using and rinsing off the CHG Soap.  9. Pat yourself dry with a CLEAN TOWEL.  10. Wear CLEAN PAJAMAS to bed the night before surgery  11. Place CLEAN SHEETS on your bed the night of your first shower and DO NOT SLEEP WITH PETS.  [facility]- Preparing for Total Shoulder Arthroplasty  Before surgery, you can play an important role. Because skin is not sterile, your skin needs to be as free of germs as possible. You can reduce the number of germs on your skin by using the following products.   Benzoyl Peroxide Gel  o Reduces the number of germs present on the skin  o Applied twice a day to shoulder area starting two days before surgery   Chlorhexidine Gluconate (CHG) Soap (instructions listed above on how to wash with CHG Soap)  o An antiseptic cleaner that kills germs and bonds with the skin to continue killing germs even after washing  o Used for showering the night before surgery and morning of surgery   ==================================================================  Please follow these instructions carefully:  BENZOYL PEROXIDE 5% GEL  Please do not use if you have an allergy to benzoyl  peroxide. If your skin becomes reddened/irritated stop using the benzoyl peroxide.  Starting two days before surgery, apply as follows:  1. Apply benzoyl peroxide in the morning and at night. Apply after taking a shower. If you are not taking a shower clean entire shoulder front, back, and side along with the armpit with a clean wet washcloth.  2. Place a quarter-sized dollop on your SHOULDER and rub in thoroughly, making sure to cover the front, back, and side of your shoulder, along with the armpit.   2 Days prior to Surgery First Dose on _____1/2/21________ Morning Second Dose on ___1/2/21___________ Night  Day Before Surgery First Dose on _____1/3/21_________ Morning Night before surgery wash (entire body except face and private areas) with CHG Soap THEN Second Dose on _____1/3/21_______ Night   Morning of Surgery  wash BODY AGAIN with CHG Soap   4. Do NOT apply benzoyl peroxide gel on the day of surgery   Day of Surgery: Wear Clean/Comfortable clothing the morning of surgery Do not apply any deodorants/lotions.   Remember to brush your teeth WITH YOUR REGULAR TOOTHPASTE.   Please read over the following fact sheets that you were given.

## 2020-11-30 ENCOUNTER — Other Ambulatory Visit: Payer: Self-pay

## 2020-11-30 ENCOUNTER — Encounter (HOSPITAL_COMMUNITY)
Admission: RE | Admit: 2020-11-30 | Discharge: 2020-11-30 | Disposition: A | Discharge status: Home / Self Care | Payer: Medicare Other | Source: Ambulatory Visit | Attending: Orthopedic Surgery | Admitting: Orthopedic Surgery

## 2020-11-30 ENCOUNTER — Encounter (HOSPITAL_COMMUNITY): Payer: Self-pay

## 2020-11-30 DIAGNOSIS — M75101 Unspecified rotator cuff tear or rupture of right shoulder, not specified as traumatic: Secondary | ICD-10-CM | POA: Diagnosis not present

## 2020-11-30 DIAGNOSIS — E785 Hyperlipidemia, unspecified: Secondary | ICD-10-CM | POA: Diagnosis not present

## 2020-11-30 DIAGNOSIS — Z7982 Long term (current) use of aspirin: Secondary | ICD-10-CM | POA: Insufficient documentation

## 2020-11-30 DIAGNOSIS — I10 Essential (primary) hypertension: Secondary | ICD-10-CM | POA: Diagnosis not present

## 2020-11-30 DIAGNOSIS — Z87891 Personal history of nicotine dependence: Secondary | ICD-10-CM | POA: Insufficient documentation

## 2020-11-30 DIAGNOSIS — Z7984 Long term (current) use of oral hypoglycemic drugs: Secondary | ICD-10-CM | POA: Diagnosis not present

## 2020-11-30 DIAGNOSIS — K449 Diaphragmatic hernia without obstruction or gangrene: Secondary | ICD-10-CM | POA: Diagnosis not present

## 2020-11-30 DIAGNOSIS — Z7901 Long term (current) use of anticoagulants: Secondary | ICD-10-CM | POA: Insufficient documentation

## 2020-11-30 DIAGNOSIS — I2510 Atherosclerotic heart disease of native coronary artery without angina pectoris: Secondary | ICD-10-CM | POA: Diagnosis not present

## 2020-11-30 DIAGNOSIS — Z01812 Encounter for preprocedural laboratory examination: Secondary | ICD-10-CM | POA: Diagnosis not present

## 2020-11-30 DIAGNOSIS — I252 Old myocardial infarction: Secondary | ICD-10-CM | POA: Diagnosis not present

## 2020-11-30 DIAGNOSIS — R7303 Prediabetes: Secondary | ICD-10-CM | POA: Insufficient documentation

## 2020-11-30 DIAGNOSIS — Z79899 Other long term (current) drug therapy: Secondary | ICD-10-CM | POA: Insufficient documentation

## 2020-11-30 HISTORY — DX: Prediabetes: R73.03

## 2020-11-30 LAB — CBC
HCT: 36.7 % — ABNORMAL LOW (ref 39.0–52.0)
Hemoglobin: 11.7 g/dL — ABNORMAL LOW (ref 13.0–17.0)
MCH: 28.8 pg (ref 26.0–34.0)
MCHC: 31.9 g/dL (ref 30.0–36.0)
MCV: 90.4 fL (ref 80.0–100.0)
Platelets: 287 10*3/uL (ref 150–400)
RBC: 4.06 MIL/uL — ABNORMAL LOW (ref 4.22–5.81)
RDW: 13 % (ref 11.5–15.5)
WBC: 9.2 10*3/uL (ref 4.0–10.5)
nRBC: 0 % (ref 0.0–0.2)

## 2020-11-30 LAB — HEMOGLOBIN A1C
Hgb A1c MFr Bld: 6.3 % — ABNORMAL HIGH (ref 4.8–5.6)
Mean Plasma Glucose: 134.11 mg/dL

## 2020-11-30 LAB — URINALYSIS, ROUTINE W REFLEX MICROSCOPIC
Bilirubin Urine: NEGATIVE
Glucose, UA: NEGATIVE mg/dL
Hgb urine dipstick: NEGATIVE
Ketones, ur: NEGATIVE mg/dL
Leukocytes,Ua: NEGATIVE
Nitrite: NEGATIVE
Protein, ur: NEGATIVE mg/dL
Specific Gravity, Urine: 1.023 (ref 1.005–1.030)
pH: 6 (ref 5.0–8.0)

## 2020-11-30 LAB — BASIC METABOLIC PANEL
Anion gap: 11 (ref 5–15)
BUN: 20 mg/dL (ref 8–23)
CO2: 26 mmol/L (ref 22–32)
Calcium: 9.6 mg/dL (ref 8.9–10.3)
Chloride: 103 mmol/L (ref 98–111)
Creatinine, Ser: 0.92 mg/dL (ref 0.61–1.24)
GFR, Estimated: >60 mL/min (ref >60)
Glucose, Bld: 121 mg/dL — ABNORMAL HIGH (ref 70–99)
Potassium: 4.2 mmol/L (ref 3.5–5.1)
Sodium: 140 mmol/L (ref 135–145)

## 2020-11-30 LAB — SURGICAL PCR SCREEN
MRSA, PCR: NEGATIVE
Staphylococcus aureus: NEGATIVE

## 2020-11-30 LAB — GLUCOSE, CAPILLARY: Glucose-Capillary: 135 mg/dL — ABNORMAL HIGH (ref 70–99)

## 2020-11-30 NOTE — Progress Notes (Signed)
PCP - Lynnea Ferrier Cardiologist - [place]  Chest x-ray - n/a EKG - 10-26-20  DM - pre-diabetic  Aspirin Instructions: Follow your surgeon's instructions on when to stop ASA  ERAS Protcol - yes, water given   COVID TEST- Friday 12-31   Anesthesia review: yes, heart history  Patient denies shortness of breath, fever, cough and chest pain at PAT appointment   All instructions explained to the patient, with a verbal understanding of the material. Patient agrees to go over the instructions while at home for a better understanding. Patient also instructed to self quarantine after being tested for COVID-19. The opportunity to ask questions was provided.

## 2020-12-01 ENCOUNTER — Telehealth: Payer: Self-pay | Admitting: Orthopedic Surgery

## 2020-12-01 LAB — URINE CULTURE: Culture: NO GROWTH

## 2020-12-01 NOTE — Telephone Encounter (Signed)
See below

## 2020-12-01 NOTE — Progress Notes (Addendum)
Anesthesia Chart Review:  Case: [id number] Date/Time: 12/06/20 0715   Procedure: RIGHT REVERSE SHOULDER ARTHROPLASTY (Right Shoulder)   Anesthesia type: General   Pre-op diagnosis: right shoulder rotator cuff arthropathy   [facility] / [facility]   Surgeons: Cammy Copa, MD      DISCUSSION: Patient is a 69 year old male scheduled for the above procedure.  History includes former smoker (quit 12/03/68), CAD (NSTEMI, s/p DES x2 Mid & Distal RCA 05/15/10), HTN, HLD, prediabetes, hiatal hernia, cholecystectomy (08/01/05), left rotator cuff repair (10/20/13).   Preoperative cardiology input pending, but recently evaluated on 10/26/20 by Manson Passey, PA. 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 Compliant with medications. 12 month follow-up planned. Last cardiac study seen was his LHC/RCA PCI in 2011 (EF 50%).  Preoperative COVID-19 test scheduled for 12/02/20.   ADDENDUM 12/05/20 12:17 PM: 12/02/20 COVID-19 test negative. Preoperative cardiology input outlined on 12/05/20 by Judy Pimple, PA-C, ".Marland KitchenMarland KitchenTherefore, based on ACC/AHA guidelines, the patient would be at acceptable risk for the planned procedure without further cardiovascular testing..."  Anesthesia team to evaluate on the day of surgery.   VS: BP (!) 146/90   Pulse 84   Temp 36.8 C (Oral)   Resp 18   Ht 5\' 10"  (1.778 m)   Wt 97.5 kg   SpO2 99%   BMI 30.85 kg/m    PROVIDERS: , MD is PCP Donita Brooks, MD is cardiologist   LABS: Labs reviewed: Acceptable for surgery. (all labs ordered are listed, but only abnormal results are displayed)  Labs Reviewed  GLUCOSE, CAPILLARY - Abnormal; Notable for the following components:      Result Value   Glucose-Capillary 135 (*)    All other components within normal limits  CBC - Abnormal; Notable for the following components:   RBC 4.06 (*)    Hemoglobin 11.7 (*)    HCT 36.7 (*)     All other components within normal limits  BASIC METABOLIC PANEL - Abnormal; Notable for the following components:   Glucose, Bld 121 (*)    All other components within normal limits  URINALYSIS, ROUTINE W REFLEX MICROSCOPIC - Abnormal; Notable for the following components:   APPearance HAZY (*)    All other components within normal limits  HEMOGLOBIN A1C - Abnormal; Notable for the following components:   Hgb A1c MFr Bld 6.3 (*)    All other components within normal limits  URINE CULTURE  SURGICAL PCR SCREEN    IMAGES: CT Right shoulder 11/01/20: IMPRESSION: - Complete supraspinatus and near-complete infraspinatus tendon tears are chronic. There is also a large partial tear of the subscapularis which appears chronic. The tears result in abutment of the humeral head with the acromion. The acromion is remodeled and sclerotic. - Moderate to moderately severe acromioclavicular and mild-to-moderate glenohumeral osteoarthritis.   EKG: 10/26/20: NSR   CV: Cardiac cath 05/15/10 (insetting of NSTEMI): LV: Hypokinesis of the basal to mid inferior wall, EF 50% RCA: Mid 95% and 95% ostial PLV stenosis with a branch off the proximal PLV that appeared to be occluded.   LM: Luminal irregularities. LCX: Luminal irregularities in the LCX and large OM1. LAD: Moderate-sized D1 with luminal irregularities. 30% proximal LAD and 30-40% diffuse distal LAD stenosis.   PCI: Promus DES to tandem mid and distal RCA lesions.   Past Medical History:  Diagnosis Date  . Coronary artery disease 2011    2 stents placed  .  H/O hiatal hernia   . Hyperlipidemia    borderline; status post stress test x2, the last one 5-6 years ago and without significant abnormality per the patient  . Hypertension    for approximately 5 years  . Myocardial infarction (HCC) 2011  . Pre-diabetes   . Prediabetes     Past Surgical History:  Procedure Laterality Date  . CHOLECYSTECTOMY    . COLONOSCOPY    .  CORONARY ANGIOPLASTY     DES to mid and distal RCA 05/15/10  . ROTATOR CUFF REPAIR Left 10/20/2013   Dr August Saucer  . SHOULDER ARTHROSCOPY WITH OPEN ROTATOR CUFF REPAIR Left 10/20/2013   Procedure: SHOULDER ARTHROSCOPY WITH OPEN ROTATOR CUFF REPAIR;  Surgeon: Cammy Copa, MD;  Location: [facility] OR;  Service: Orthopedics;  Laterality: Left;  Left shoulder arthroscopy, debridement, open biceps tenodesis and rotator cuff repair.    MEDICATIONS: . APPLE CIDER VINEGAR PO  . ascorbic acid (VITAMIN C) 500 MG tablet  . aspirin EC 81 MG tablet  . atorvastatin (LIPITOR) 40 MG tablet  . cetirizine (ZYRTEC) 10 MG tablet  . cholecalciferol (VITAMIN D) 25 MCG (1000 UNIT) tablet  . fluticasone (FLONASE) 50 MCG/ACT nasal spray  . ibuprofen (ADVIL) 200 MG tablet  . losartan (COZAAR) 50 MG tablet  . metFORMIN (GLUCOPHAGE) 850 MG tablet  . metoprolol tartrate (LOPRESSOR) 25 MG tablet  . Multiple Vitamin (MULTIVITAMIN WITH MINERALS) TABS tablet  . nitroGLYCERIN (NITROSTAT) 0.4 MG SL tablet  . Zinc 50 MG TABS   No current facility-administered medications for this encounter.  Not currently taking Flonase.    Shonna Chock, PA-C Surgical Short Stay/Anesthesiology [facility] Phone [phone] [facility] Phone [phone] 12/01/2020 6:07 PM

## 2020-12-01 NOTE — Telephone Encounter (Signed)
IC advised.  

## 2020-12-01 NOTE — Telephone Encounter (Signed)
Okay to take Tylenol and use ice/heat for pain control.   No aspirin or anti-inflammtories like ibuprofen, aleve, etc.

## 2020-12-01 NOTE — Telephone Encounter (Signed)
Patient is scheduled for right reverse shoulder arthroplasty on Dec 06, 2020 with Dr. August Saucer.  Patient's wife [name]) calling wanting to know what he CAN take for pain.  She said someone at the pre-admission visit yesterday told him to stop taking ALL medications.  PLEASE ADVISE by the end of the day.    Cb# [phone]

## 2020-12-01 NOTE — Anesthesia Preprocedure Evaluation (Addendum)
Anesthesia Evaluation    Airway Mallampati: II  TM Distance: >3 FB     Dental  (+) Dental Advisory Given   Pulmonary former smoker,    breath sounds clear to auscultation       Cardiovascular hypertension, Pt. on medications and Pt. on home beta blockers + CAD and + Cardiac Stents   Rhythm:Regular Rate:Normal     Neuro/Psych negative neurological ROS     GI/Hepatic Neg liver ROS, hiatal hernia,   Endo/Other  diabetes, Oral Hypoglycemic Agents  Renal/GU negative Renal ROS     Musculoskeletal   Abdominal   Peds  Hematology  (+) anemia ,   Anesthesia Other Findings   Reproductive/Obstetrics                             Lab Results  Component Value Date   WBC 9.2 11/30/2020   HGB 11.7 (L) 11/30/2020   HCT 36.7 (L) 11/30/2020   MCV 90.4 11/30/2020   PLT 287 11/30/2020   Lab Results  Component Value Date   CREATININE 0.92 11/30/2020   BUN 20 11/30/2020   NA 140 11/30/2020   K 4.2 11/30/2020   CL 103 11/30/2020   CO2 26 11/30/2020    Anesthesia Physical Anesthesia Plan  ASA: III  Anesthesia Plan: General   Post-op Pain Management:  Regional for Post-op pain   Induction: Intravenous  PONV Risk Score and Plan: 2 and Dexamethasone, Ondansetron and Treatment may vary due to age or medical condition  Airway Management Planned: Oral ETT  Additional Equipment:   Intra-op Plan:   Post-operative Plan: Extubation in OR  Informed Consent:   Plan Discussed with:   Anesthesia Plan Comments: (PAT note written by Shonna Chock, PA-C. )       Anesthesia Quick Evaluation

## 2020-12-02 ENCOUNTER — Other Ambulatory Visit (HOSPITAL_COMMUNITY)
Admission: RE | Admit: 2020-12-02 | Discharge: 2020-12-02 | Disposition: A | Discharge status: Home / Self Care | Payer: Medicare Other | Source: Ambulatory Visit | Attending: Orthopedic Surgery | Admitting: Orthopedic Surgery

## 2020-12-02 DIAGNOSIS — Z01812 Encounter for preprocedural laboratory examination: Secondary | ICD-10-CM | POA: Diagnosis not present

## 2020-12-02 DIAGNOSIS — Z20822 Contact with and (suspected) exposure to covid-19: Secondary | ICD-10-CM | POA: Diagnosis not present

## 2020-12-02 LAB — SARS CORONAVIRUS 2 (TAT 6-24 HRS): SARS Coronavirus 2: NEGATIVE

## 2020-12-05 ENCOUNTER — Telehealth: Payer: Self-pay | Admitting: *Deleted

## 2020-12-05 NOTE — Telephone Encounter (Signed)
   Primary Cardiologist: Sherren Mocha, MD  Chart reviewed as part of pre-operative protocol coverage. Patient was contacted 12/05/2020 in reference to pre-operative risk assessment for pending surgery as outlined below.  Paul Rubio was last seen on 10/26/20 by [name], PA-C.  Since that day, Paul Rubio has done well from a cardiac standpoint. 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 He has no complaints of chest pain or SOB. He can complete 4 MET's without anginal complaints.   Therefore, based on ACC/AHA guidelines, the patient would be at acceptable risk for the planned procedure without further cardiovascular testing.   The patient was advised that if he develops new symptoms prior to surgery to contact our office to arrange for a follow-up visit, and he verbalized understanding.  Patient has been holding his aspirin since his visit with ortho 11/30/20. Recommend restarting aspirin as soon as he is cleared to do so by his orthopedist.   I will route this recommendation to the requesting party via [facility] fax function and remove from pre-op pool. Please call with questions.  [name], PA-C 12/05/2020, 10:47 AM

## 2020-12-05 NOTE — Telephone Encounter (Signed)
   [facility] Pre-operative Risk Assessment    HEARTCARE STAFF: - Please ensure there is not already an duplicate clearance open for this procedure. - Under Visit Info/Reason for Call, type in Other and utilize the format Clearance MM/DD/YY or Clearance TBD. Do not use dashes or single digits. - If request is for dental extraction, please clarify the # of teeth to be extracted.  Request for surgical clearance:  1. What type of surgery is being performed? RIGHT REVERSE SHOULDER ARTHROPLASTY   2. When is this surgery scheduled? 12/06/20   3. What type of clearance is required (medical clearance vs. Pharmacy clearance to hold med vs. Both)? MEDICAL  4. Are there any medications that need to be held prior to surgery and how long? ASA    5. Practice name and name of physician performing surgery? [facility]; DR. Marlou Sa   6. What is the office phone number? [phone]   7.   What is the office fax number? [phone]  8.   Anesthesia type (None, local, MAC, general) ? GENERAL   Paul Rubio 12/05/2020, 9:31 AM  _________________________________________________________________   (provider comments below)

## 2020-12-06 ENCOUNTER — Inpatient Hospital Stay (HOSPITAL_COMMUNITY)
Admission: AD | Admit: 2020-12-06 | Discharge: 2020-12-09 | DRG: 981 | Disposition: A | Discharge status: Home Health | Payer: Medicare Other | Attending: Orthopedic Surgery | Admitting: Orthopedic Surgery

## 2020-12-06 ENCOUNTER — Ambulatory Visit (HOSPITAL_COMMUNITY): Payer: Medicare Other | Admitting: Certified Registered Nurse Anesthetist

## 2020-12-06 ENCOUNTER — Encounter (HOSPITAL_COMMUNITY)
Admission: AD | Disposition: A | Discharge status: Home Health | Payer: Self-pay | Source: Home / Self Care | Attending: Orthopedic Surgery

## 2020-12-06 ENCOUNTER — Other Ambulatory Visit: Payer: Self-pay

## 2020-12-06 ENCOUNTER — Ambulatory Visit (HOSPITAL_COMMUNITY): Payer: Medicare Other | Admitting: Vascular Surgery

## 2020-12-06 ENCOUNTER — Encounter (HOSPITAL_COMMUNITY): Payer: Self-pay | Admitting: Orthopedic Surgery

## 2020-12-06 ENCOUNTER — Observation Stay (HOSPITAL_COMMUNITY): Payer: Medicare Other

## 2020-12-06 DIAGNOSIS — M19011 Primary osteoarthritis, right shoulder: Secondary | ICD-10-CM | POA: Diagnosis not present

## 2020-12-06 DIAGNOSIS — R7981 Abnormal blood-gas level: Secondary | ICD-10-CM

## 2020-12-06 DIAGNOSIS — Z96611 Presence of right artificial shoulder joint: Secondary | ICD-10-CM

## 2020-12-06 DIAGNOSIS — I2510 Atherosclerotic heart disease of native coronary artery without angina pectoris: Secondary | ICD-10-CM | POA: Diagnosis not present

## 2020-12-06 DIAGNOSIS — Z79899 Other long term (current) drug therapy: Secondary | ICD-10-CM | POA: Diagnosis not present

## 2020-12-06 DIAGNOSIS — I252 Old myocardial infarction: Secondary | ICD-10-CM | POA: Diagnosis not present

## 2020-12-06 DIAGNOSIS — J189 Pneumonia, unspecified organism: Secondary | ICD-10-CM | POA: Diagnosis present

## 2020-12-06 DIAGNOSIS — G8918 Other acute postprocedural pain: Secondary | ICD-10-CM | POA: Diagnosis not present

## 2020-12-06 DIAGNOSIS — Z955 Presence of coronary angioplasty implant and graft: Secondary | ICD-10-CM | POA: Diagnosis not present

## 2020-12-06 DIAGNOSIS — E785 Hyperlipidemia, unspecified: Secondary | ICD-10-CM | POA: Diagnosis present

## 2020-12-06 DIAGNOSIS — M75100 Unspecified rotator cuff tear or rupture of unspecified shoulder, not specified as traumatic: Secondary | ICD-10-CM | POA: Diagnosis present

## 2020-12-06 DIAGNOSIS — J9601 Acute respiratory failure with hypoxia: Secondary | ICD-10-CM | POA: Diagnosis not present

## 2020-12-06 DIAGNOSIS — Z87891 Personal history of nicotine dependence: Secondary | ICD-10-CM

## 2020-12-06 DIAGNOSIS — E119 Type 2 diabetes mellitus without complications: Secondary | ICD-10-CM | POA: Diagnosis not present

## 2020-12-06 DIAGNOSIS — Z7984 Long term (current) use of oral hypoglycemic drugs: Secondary | ICD-10-CM

## 2020-12-06 DIAGNOSIS — J9811 Atelectasis: Secondary | ICD-10-CM | POA: Diagnosis not present

## 2020-12-06 DIAGNOSIS — E782 Mixed hyperlipidemia: Secondary | ICD-10-CM | POA: Diagnosis not present

## 2020-12-06 DIAGNOSIS — J690 Pneumonitis due to inhalation of food and vomit: Secondary | ICD-10-CM | POA: Diagnosis not present

## 2020-12-06 DIAGNOSIS — Z8249 Family history of ischemic heart disease and other diseases of the circulatory system: Secondary | ICD-10-CM | POA: Diagnosis not present

## 2020-12-06 DIAGNOSIS — M12811 Other specific arthropathies, not elsewhere classified, right shoulder: Secondary | ICD-10-CM | POA: Diagnosis not present

## 2020-12-06 DIAGNOSIS — R0602 Shortness of breath: Secondary | ICD-10-CM | POA: Diagnosis not present

## 2020-12-06 DIAGNOSIS — Z471 Aftercare following joint replacement surgery: Secondary | ICD-10-CM | POA: Diagnosis not present

## 2020-12-06 DIAGNOSIS — I10 Essential (primary) hypertension: Secondary | ICD-10-CM | POA: Diagnosis present

## 2020-12-06 DIAGNOSIS — Z9889 Other specified postprocedural states: Secondary | ICD-10-CM

## 2020-12-06 HISTORY — PX: OTHER SURGICAL HISTORY: SHX169

## 2020-12-06 HISTORY — PX: REVERSE SHOULDER ARTHROPLASTY: SHX5054

## 2020-12-06 LAB — GLUCOSE, CAPILLARY
Glucose-Capillary: 141 mg/dL — ABNORMAL HIGH (ref 70–99)
Glucose-Capillary: 157 mg/dL — ABNORMAL HIGH (ref 70–99)
Glucose-Capillary: 148 mg/dL — ABNORMAL HIGH (ref 70–99)

## 2020-12-06 SURGERY — ARTHROPLASTY, SHOULDER, TOTAL, REVERSE
Anesthesia: General | Site: Shoulder | Laterality: Right

## 2020-12-06 MED ORDER — BUPIVACAINE LIPOSOME 1.3 % IJ SUSP
INTRAMUSCULAR | Status: DC | PRN
  Administered 2020-12-06: 10 mL via PERINEURAL

## 2020-12-06 MED ORDER — 0.9 % SODIUM CHLORIDE (POUR BTL) OPTIME
TOPICAL | Status: DC | PRN
  Administered 2020-12-06: 4000 mL

## 2020-12-06 MED ORDER — ATORVASTATIN CALCIUM 40 MG PO TABS
40.0000 mg | ORAL_TABLET | Freq: Every evening | ORAL | Status: DC
  Administered 2020-12-06 – 2020-12-08 (×3): 40 mg via ORAL
  Filled 2020-12-06 (×3): qty 1

## 2020-12-06 MED ORDER — PHENYLEPHRINE HCL-NACL 10-0.9 MG/250ML-% IV SOLN
INTRAVENOUS | Status: DC | PRN
  Administered 2020-12-06: 25 ug/min via INTRAVENOUS

## 2020-12-06 MED ORDER — FENTANYL CITRATE (PF) 250 MCG/5ML IJ SOLN
INTRAMUSCULAR | Status: DC | PRN
  Administered 2020-12-06 (×2): 50 ug via INTRAVENOUS

## 2020-12-06 MED ORDER — CEFAZOLIN SODIUM-DEXTROSE 2-4 GM/100ML-% IV SOLN
2.0000 g | INTRAVENOUS | Status: AC
  Administered 2020-12-06: 2 g via INTRAVENOUS
  Filled 2020-12-06: qty 100

## 2020-12-06 MED ORDER — HYDROMORPHONE HCL 1 MG/ML IJ SOLN
0.5000 mg | INTRAMUSCULAR | Status: DC | PRN
  Administered 2020-12-07 (×2): 0.5 mg via INTRAVENOUS
  Filled 2020-12-06 (×2): qty 0.5

## 2020-12-06 MED ORDER — POVIDONE-IODINE 10 % EX SWAB
2.0000 "application " | Freq: Once | CUTANEOUS | Status: AC
  Administered 2020-12-06: 2 via TOPICAL

## 2020-12-06 MED ORDER — PHENYLEPHRINE 40 MCG/ML (10ML) SYRINGE FOR IV PUSH (FOR BLOOD PRESSURE SUPPORT)
PREFILLED_SYRINGE | INTRAVENOUS | Status: DC | PRN
  Administered 2020-12-06: 80 ug via INTRAVENOUS

## 2020-12-06 MED ORDER — LIDOCAINE 2% (20 MG/ML) 5 ML SYRINGE
INTRAMUSCULAR | Status: DC | PRN
  Administered 2020-12-06: 20 mg via INTRAVENOUS

## 2020-12-06 MED ORDER — CHLORHEXIDINE GLUCONATE 0.12 % MT SOLN
OROMUCOSAL | Status: AC
  Administered 2020-12-06: 15 mL
  Filled 2020-12-06: qty 15

## 2020-12-06 MED ORDER — PROPOFOL 10 MG/ML IV BOLUS
INTRAVENOUS | Status: AC
  Filled 2020-12-06: qty 40

## 2020-12-06 MED ORDER — EPHEDRINE 5 MG/ML INJ
INTRAVENOUS | Status: AC
  Filled 2020-12-06: qty 10

## 2020-12-06 MED ORDER — METOCLOPRAMIDE HCL 5 MG PO TABS: 5.0000 mg | ORAL_TABLET | Freq: Three times a day (TID) | ORAL | Status: DC | PRN

## 2020-12-06 MED ORDER — VANCOMYCIN HCL 1000 MG IV SOLR
INTRAVENOUS | Status: DC | PRN
  Administered 2020-12-06: 1000 mg via TOPICAL

## 2020-12-06 MED ORDER — MIDAZOLAM HCL 2 MG/2ML IJ SOLN
INTRAMUSCULAR | Status: AC
  Filled 2020-12-06: qty 2

## 2020-12-06 MED ORDER — ACETAMINOPHEN 500 MG PO TABS
1000.0000 mg | ORAL_TABLET | Freq: Once | ORAL | Status: AC
  Administered 2020-12-06: 1000 mg via ORAL
  Filled 2020-12-06: qty 2

## 2020-12-06 MED ORDER — MENTHOL 3 MG MT LOZG: 1.0000 | LOZENGE | OROMUCOSAL | Status: DC | PRN

## 2020-12-06 MED ORDER — CEFAZOLIN SODIUM-DEXTROSE 2-4 GM/100ML-% IV SOLN
2.0000 g | Freq: Three times a day (TID) | INTRAVENOUS | Status: AC
  Administered 2020-12-06 – 2020-12-07 (×3): 2 g via INTRAVENOUS
  Filled 2020-12-06 (×3): qty 100

## 2020-12-06 MED ORDER — METOCLOPRAMIDE HCL 5 MG/ML IJ SOLN: 5.0000 mg | Freq: Three times a day (TID) | INTRAMUSCULAR | Status: DC | PRN

## 2020-12-06 MED ORDER — DEXAMETHASONE SODIUM PHOSPHATE 10 MG/ML IJ SOLN
INTRAMUSCULAR | Status: AC
  Filled 2020-12-06: qty 1

## 2020-12-06 MED ORDER — ASPIRIN EC 81 MG PO TBEC
81.0000 mg | DELAYED_RELEASE_TABLET | Freq: Every day | ORAL | Status: DC
  Administered 2020-12-06 – 2020-12-09 (×4): 81 mg via ORAL
  Filled 2020-12-06 (×4): qty 1

## 2020-12-06 MED ORDER — POVIDONE-IODINE 7.5 % EX SOLN
Freq: Once | CUTANEOUS | Status: AC
  Administered 2020-12-06: 1 via TOPICAL
  Filled 2020-12-06: qty 118

## 2020-12-06 MED ORDER — IRRISEPT - 450ML BOTTLE WITH 0.05% CHG IN STERILE WATER, USP 99.95% OPTIME
TOPICAL | Status: DC | PRN
  Administered 2020-12-06: 450 mL

## 2020-12-06 MED ORDER — ONDANSETRON HCL 4 MG PO TABS: 4.0000 mg | ORAL_TABLET | Freq: Four times a day (QID) | ORAL | Status: DC | PRN

## 2020-12-06 MED ORDER — ONDANSETRON HCL 4 MG/2ML IJ SOLN: 4.0000 mg | Freq: Four times a day (QID) | INTRAMUSCULAR | Status: DC | PRN

## 2020-12-06 MED ORDER — GABAPENTIN 300 MG PO CAPS
300.0000 mg | ORAL_CAPSULE | Freq: Three times a day (TID) | ORAL | Status: DC
  Administered 2020-12-06 – 2020-12-09 (×9): 300 mg via ORAL
  Filled 2020-12-06 (×6): qty 1
  Filled 2020-12-06: qty 3
  Filled 2020-12-06: qty 1

## 2020-12-06 MED ORDER — LIDOCAINE 2% (20 MG/ML) 5 ML SYRINGE
INTRAMUSCULAR | Status: AC
  Filled 2020-12-06: qty 5

## 2020-12-06 MED ORDER — ONDANSETRON HCL 4 MG/2ML IJ SOLN
INTRAMUSCULAR | Status: AC
  Filled 2020-12-06: qty 2

## 2020-12-06 MED ORDER — BUPIVACAINE-EPINEPHRINE (PF) 0.5% -1:200000 IJ SOLN
INTRAMUSCULAR | Status: DC | PRN
  Administered 2020-12-06: 15 mL via PERINEURAL

## 2020-12-06 MED ORDER — PHENOL 1.4 % MT LIQD: 1.0000 | OROMUCOSAL | Status: DC | PRN

## 2020-12-06 MED ORDER — DOCUSATE SODIUM 100 MG PO CAPS
100.0000 mg | ORAL_CAPSULE | Freq: Two times a day (BID) | ORAL | Status: DC
  Administered 2020-12-06 – 2020-12-09 (×7): 100 mg via ORAL
  Filled 2020-12-06 (×7): qty 1

## 2020-12-06 MED ORDER — METHOCARBAMOL 1000 MG/10ML IJ SOLN: 500.0000 mg | Freq: Four times a day (QID) | INTRAVENOUS | Status: DC | PRN

## 2020-12-06 MED ORDER — METFORMIN HCL 850 MG PO TABS
850.0000 mg | ORAL_TABLET | Freq: Two times a day (BID) | ORAL | Status: DC
  Administered 2020-12-06 – 2020-12-09 (×6): 850 mg via ORAL
  Filled 2020-12-06 (×7): qty 1

## 2020-12-06 MED ORDER — ONDANSETRON HCL 4 MG/2ML IJ SOLN
INTRAMUSCULAR | Status: DC | PRN
  Administered 2020-12-06: 4 mg via INTRAVENOUS

## 2020-12-06 MED ORDER — METOPROLOL TARTRATE 12.5 MG HALF TABLET
12.5000 mg | ORAL_TABLET | Freq: Two times a day (BID) | ORAL | Status: DC
  Administered 2020-12-06 – 2020-12-09 (×6): 12.5 mg via ORAL
  Filled 2020-12-06 (×6): qty 1

## 2020-12-06 MED ORDER — TRANEXAMIC ACID-NACL 1000-0.7 MG/100ML-% IV SOLN
1000.0000 mg | INTRAVENOUS | Status: AC
  Administered 2020-12-06: 1000 mg via INTRAVENOUS
  Filled 2020-12-06: qty 100

## 2020-12-06 MED ORDER — PROPOFOL 10 MG/ML IV BOLUS
INTRAVENOUS | Status: DC | PRN
  Administered 2020-12-06: 150 mg via INTRAVENOUS
  Administered 2020-12-06: 50 mg via INTRAVENOUS

## 2020-12-06 MED ORDER — ROCURONIUM BROMIDE 10 MG/ML (PF) SYRINGE
PREFILLED_SYRINGE | INTRAVENOUS | Status: DC | PRN
  Administered 2020-12-06: 40 mg via INTRAVENOUS

## 2020-12-06 MED ORDER — VANCOMYCIN HCL 1000 MG IV SOLR
INTRAVENOUS | Status: AC
  Filled 2020-12-06: qty 1000

## 2020-12-06 MED ORDER — LACTATED RINGERS IV SOLN: INTRAVENOUS | Status: DC | PRN

## 2020-12-06 MED ORDER — METHOCARBAMOL 500 MG PO TABS
500.0000 mg | ORAL_TABLET | Freq: Four times a day (QID) | ORAL | Status: DC | PRN
  Administered 2020-12-07: 500 mg via ORAL
  Filled 2020-12-06: qty 1

## 2020-12-06 MED ORDER — DEXAMETHASONE SODIUM PHOSPHATE 10 MG/ML IJ SOLN
INTRAMUSCULAR | Status: DC | PRN
  Administered 2020-12-06: 4 mg via INTRAVENOUS

## 2020-12-06 MED ORDER — LOSARTAN POTASSIUM 50 MG PO TABS
50.0000 mg | ORAL_TABLET | Freq: Every day | ORAL | Status: DC
  Administered 2020-12-06 – 2020-12-09 (×4): 50 mg via ORAL
  Filled 2020-12-06 (×4): qty 1

## 2020-12-06 MED ORDER — ACETAMINOPHEN 325 MG PO TABS
325.0000 mg | ORAL_TABLET | Freq: Four times a day (QID) | ORAL | Status: DC | PRN
  Administered 2020-12-08 – 2020-12-09 (×3): 650 mg via ORAL
  Filled 2020-12-06 (×3): qty 2

## 2020-12-06 MED ORDER — INSULIN ASPART 100 UNIT/ML ~~LOC~~ SOLN
0.0000 [IU] | Freq: Three times a day (TID) | SUBCUTANEOUS | Status: DC
  Administered 2020-12-06: 3 [IU] via SUBCUTANEOUS
  Administered 2020-12-06 – 2020-12-07 (×3): 2 [IU] via SUBCUTANEOUS
  Administered 2020-12-07: 8 [IU] via SUBCUTANEOUS
  Administered 2020-12-08 (×2): 2 [IU] via SUBCUTANEOUS

## 2020-12-06 MED ORDER — OXYCODONE HCL 5 MG PO TABS
5.0000 mg | ORAL_TABLET | ORAL | Status: DC | PRN
  Administered 2020-12-07 (×3): 10 mg via ORAL
  Administered 2020-12-08: 5 mg via ORAL
  Filled 2020-12-06 (×4): qty 2
  Filled 2020-12-06: qty 1

## 2020-12-06 MED ORDER — MIDAZOLAM HCL 5 MG/5ML IJ SOLN
INTRAMUSCULAR | Status: DC | PRN
  Administered 2020-12-06: 1 mg via INTRAVENOUS

## 2020-12-06 MED ORDER — FENTANYL CITRATE (PF) 250 MCG/5ML IJ SOLN
INTRAMUSCULAR | Status: AC
  Filled 2020-12-06: qty 5

## 2020-12-06 MED ORDER — PHENYLEPHRINE 40 MCG/ML (10ML) SYRINGE FOR IV PUSH (FOR BLOOD PRESSURE SUPPORT)
PREFILLED_SYRINGE | INTRAVENOUS | Status: AC
  Filled 2020-12-06: qty 10

## 2020-12-06 MED ORDER — EPHEDRINE SULFATE-NACL 50-0.9 MG/10ML-% IV SOSY
PREFILLED_SYRINGE | INTRAVENOUS | Status: DC | PRN
  Administered 2020-12-06: 5 mg via INTRAVENOUS

## 2020-12-06 MED ORDER — SUGAMMADEX SODIUM 200 MG/2ML IV SOLN
INTRAVENOUS | Status: DC | PRN
  Administered 2020-12-06: 200 mg via INTRAVENOUS

## 2020-12-06 MED ORDER — LACTATED RINGERS IV SOLN: INTRAVENOUS | Status: DC

## 2020-12-06 SURGICAL SUPPLY — 86 items
AID PSTN UNV HD RSTRNT DISP (MISCELLANEOUS) ×1
ALCOHOL 70% 16 OZ (MISCELLANEOUS) ×2 IMPLANT
APL PRP STRL LF DISP 70% ISPRP (MISCELLANEOUS) ×1
AUG COMP REV MI TAPER ADAPTER (Joint) ×2 IMPLANT
AUGMENT COMP REV MI TAPR ADPTR (Joint) ×1 IMPLANT
BIT DRILL 2.7 W/STOP DISP (BIT) ×2 IMPLANT
BIT DRILL F/CENTRAL SCRW 3.2 (BIT) ×1
BIT DRILL F/CENTRAL SCRW 3.2MM (BIT) ×1 IMPLANT
BIT DRILL TWIST 2.7 (BIT) ×2 IMPLANT
BLADE SAW SGTL 13X75X1.27 (BLADE) ×2 IMPLANT
BSPLAT GLND SM AUG TPR ADPR (Joint) ×1 IMPLANT
CHLORAPREP W/TINT 26 (MISCELLANEOUS) ×2 IMPLANT
CLSR STERI-STRIP ANTIMIC 1/2X4 (GAUZE/BANDAGES/DRESSINGS) ×2 IMPLANT
COOLER ICEMAN CLASSIC (MISCELLANEOUS) ×2 IMPLANT
COVER SURGICAL LIGHT HANDLE (MISCELLANEOUS) ×2 IMPLANT
COVER WAND RF STERILE (DRAPES) ×2 IMPLANT
DRAPE INCISE IOBAN 66X45 STRL (DRAPES) ×4 IMPLANT
DRAPE U-SHAPE 47X51 STRL (DRAPES) ×4 IMPLANT
DRILL BIT F/CENTRAL SCRW 3.2MM (BIT) ×2
DRSG AQUACEL AG ADV 3.5X10 (GAUZE/BANDAGES/DRESSINGS) ×2 IMPLANT
ELECT BLADE 4.0 EZ CLEAN MEGAD (MISCELLANEOUS) ×2
ELECT REM PT RETURN 9FT ADLT (ELECTROSURGICAL) ×2
ELECTRODE BLDE 4.0 EZ CLN MEGD (MISCELLANEOUS) ×1 IMPLANT
ELECTRODE REM PT RTRN 9FT ADLT (ELECTROSURGICAL) ×1 IMPLANT
GAUZE SPONGE 4X4 12PLY STRL LF (GAUZE/BANDAGES/DRESSINGS) ×2 IMPLANT
GLENOID SPHERE 36MM CVD +3 (Orthopedic Implant) ×2 IMPLANT
GLOVE BIOGEL PI IND STRL 7.0 (GLOVE) ×1 IMPLANT
GLOVE BIOGEL PI IND STRL 8 (GLOVE) ×1 IMPLANT
GLOVE BIOGEL PI INDICATOR 7.0 (GLOVE) ×1
GLOVE BIOGEL PI INDICATOR 8 (GLOVE) ×1
GLOVE ECLIPSE 7.0 STRL STRAW (GLOVE) ×2 IMPLANT
GLOVE ECLIPSE 8.0 STRL XLNG CF (GLOVE) ×2 IMPLANT
GOWN STRL REUS W/ TWL LRG LVL3 (GOWN DISPOSABLE) ×3 IMPLANT
GOWN STRL REUS W/TWL LRG LVL3 (GOWN DISPOSABLE) ×6
GUIDE MODEL REV SHLD RT (ORTHOPEDIC DISPOSABLE SUPPLIES) ×2 IMPLANT
HYDROGEN PEROXIDE 16OZ (MISCELLANEOUS) ×2 IMPLANT
JET LAVAGE IRRISEPT WOUND (IRRIGATION / IRRIGATOR) ×2
KIT BASIN OR (CUSTOM PROCEDURE TRAY) ×2 IMPLANT
KIT TURNOVER KIT B (KITS) ×2 IMPLANT
LAVAGE JET IRRISEPT WOUND (IRRIGATION / IRRIGATOR) ×1 IMPLANT
LOOP VESSEL MAXI BLUE (MISCELLANEOUS) ×2 IMPLANT
MANIFOLD NEPTUNE II (INSTRUMENTS) ×2 IMPLANT
NDL SUT 6 .5 CRC .975X.05 MAYO (NEEDLE) IMPLANT
NEEDLE MAYO TAPER (NEEDLE)
NEEDLE TAPERED W/ NITINOL LOOP (MISCELLANEOUS) ×2 IMPLANT
NS IRRIG 1000ML POUR BTL (IV SOLUTION) ×2 IMPLANT
PACK SHOULDER (CUSTOM PROCEDURE TRAY) ×2 IMPLANT
PAD ARMBOARD 7.5X6 YLW CONV (MISCELLANEOUS) ×4 IMPLANT
PAD COLD SHLDR WRAP-ON (PAD) ×2 IMPLANT
PASSER SUT SWANSON 36MM LOOP (INSTRUMENTS) ×2 IMPLANT
PIN THREADED REVERSE (PIN) ×2 IMPLANT
REAMER GUIDE BUSHING SURG DISP (MISCELLANEOUS) ×2 IMPLANT
REAMER GUIDE W/SCREW AUG (MISCELLANEOUS) ×2 IMPLANT
RESTRAINT HEAD UNIVERSAL NS (MISCELLANEOUS) ×2 IMPLANT
RETRIEVER SUT HEWSON (MISCELLANEOUS) ×2 IMPLANT
SCREW BONE STRL 6.5MMX30MM (Screw) ×2 IMPLANT
SCREW LOCKING 4.75MMX15MM (Screw) ×4 IMPLANT
SCREW LOCKING NS 4.75MMX20MM (Screw) ×2 IMPLANT
SCREW LOCKING STRL 4.75X25X3.5 (Screw) ×2 IMPLANT
SLING ARM IMMOBILIZER LRG (SOFTGOODS) ×2 IMPLANT
SOL PREP POV-IOD 4OZ 10% (MISCELLANEOUS) ×2 IMPLANT
SPONGE LAP 18X18 RF (DISPOSABLE) ×4 IMPLANT
STEM MINI LONG 14MM 83MM (Stem) ×2 IMPLANT
STRIP CLOSURE SKIN 1/2X4 (GAUZE/BANDAGES/DRESSINGS) ×2 IMPLANT
SUCTION FRAZIER HANDLE 10FR (MISCELLANEOUS) ×2
SUCTION TUBE FRAZIER 10FR DISP (MISCELLANEOUS) ×1 IMPLANT
SUT BROADBAND TAPE 2PK 1.5 (SUTURE) ×4 IMPLANT
SUT FIBERWIRE #2 38 T-5 BLUE (SUTURE)
SUT MAXBRAID (SUTURE) IMPLANT
SUT MNCRL AB 3-0 PS2 18 (SUTURE) ×4 IMPLANT
SUT MON AB 2-0 CT2 27 (SUTURE) IMPLANT
SUT SILK 2 0 TIES 10X30 (SUTURE) ×2 IMPLANT
SUT VIC AB 0 CT1 27 (SUTURE) ×8
SUT VIC AB 0 CT1 27XBRD ANBCTR (SUTURE) ×4 IMPLANT
SUT VIC AB 1 CT1 27 (SUTURE) ×8
SUT VIC AB 1 CT1 27XBRD ANBCTR (SUTURE) ×4 IMPLANT
SUT VIC AB 2-0 CT1 27 (SUTURE) ×10
SUT VIC AB 2-0 CT1 TAPERPNT 27 (SUTURE) ×5 IMPLANT
SUT VICRYL 0 UR6 27IN ABS (SUTURE) ×10 IMPLANT
SUTURE FIBERWR #2 38 T-5 BLUE (SUTURE) IMPLANT
TOWEL GREEN STERILE (TOWEL DISPOSABLE) ×2 IMPLANT
TRAY FOL W/BAG SLVR 16FR STRL (SET/KITS/TRAYS/PACK) IMPLANT
TRAY FOLEY W/BAG SLVR 16FR LF (SET/KITS/TRAYS/PACK)
TRAY HUM MINI SHOULDER +3 40 (Joint) ×2 IMPLANT
TRAY HUM REV SHOULDER 36 +3 (Shoulder) ×2 IMPLANT
WATER STERILE IRR 1000ML POUR (IV SOLUTION) ×2 IMPLANT

## 2020-12-06 NOTE — Anesthesia Postprocedure Evaluation (Signed)
Anesthesia Post Note  Patient: Paul Rubio  Procedure(s) Performed: RIGHT REVERSE SHOULDER ARTHROPLASTY (Right Shoulder)     Patient location during evaluation: PACU Anesthesia Type: General and Regional Level of consciousness: awake and alert Pain management: pain level controlled Vital Signs Assessment: post-procedure vital signs reviewed and stable Respiratory status: spontaneous breathing, nonlabored ventilation, respiratory function stable and patient connected to nasal cannula oxygen Cardiovascular status: blood pressure returned to baseline and stable Postop Assessment: no apparent nausea or vomiting Anesthetic complications: no   No complications documented.  Last Vitals:  Vitals:   12/06/20 1334 12/06/20 1436  BP: 113/76 120/70  Pulse: 78 73  Resp: 17 17  Temp: (!) 36.4 C 36.6 C  SpO2: 95% 94%    Last Pain:  Vitals:   12/06/20 1436  TempSrc: Oral  PainSc:                  Dymphna Wadley COKER

## 2020-12-06 NOTE — Plan of Care (Signed)
  Problem: Safety: Goal: Ability to remain free from injury will improve Outcome: Progressing   Problem: Skin Integrity: Goal: Risk for impaired skin integrity will decrease Outcome: Progressing   Problem: Pain Managment: Goal: General experience of comfort will improve Outcome: Progressing   

## 2020-12-06 NOTE — Brief Op Note (Addendum)
   12/06/2020  10:58 AM  PATIENT:  Phebe Colla  70 y.o. male  PRE-OPERATIVE DIAGNOSIS:  right shoulder rotator cuff arthropathy  POST-OPERATIVE DIAGNOSIS:  right shoulder rotator cuff arthropathy  PROCEDURE:  Procedure(s): RIGHT REVERSE SHOULDER ARTHROPLASTY  SURGEON:  Surgeon(s): Cammy Copa, MD  ASSISTANT: magnant pa  ANESTHESIA:   general  EBL: 150 ml    Total I/O In: 200 [IV Piggyback:200] Out: 150 [Blood:150]  BLOOD ADMINISTERED: none  DRAINS: none   LOCAL MEDICATIONS USED: vanco  SPECIMEN:  No Specimen  COUNTS:  YES  TOURNIQUET:  * No tourniquets in log *  DICTATION: .Other Dictation: Dictation Number [id number]  PLAN OF CARE: Admit for overnight observation  PATIENT DISPOSITION:  PACU - hemodynamically stable

## 2020-12-06 NOTE — H&P (Signed)
Paul Rubio is an 70 y.o. male.   Chief Complaint: Right shoulder pain HPI: Paul Rubio is a 70 year old patient with long history of right shoulder pain.  He has irreparable rotator cuff tendon tears.  Works as a Engineer, maintenance.  Main complaint is pain over loss of function.  Has pain on a daily basis which has been refractory to nonoperative management.  MRI scan shows retracted rotator cuff tear supraspinatus and infraspinatus with atrophy.  Presents now for reverse shoulder replacement after explanation of risks and benefits  Past Medical History:  Diagnosis Date  . Coronary artery disease 2011    2 stents placed  . H/O hiatal hernia   . Hyperlipidemia    borderline; status post stress test x2, the last one 5-6 years ago and without significant abnormality per the patient  . Hypertension    for approximately 5 years  . Myocardial infarction (HCC) 2011  . Pre-diabetes   . Prediabetes     Past Surgical History:  Procedure Laterality Date  . CHOLECYSTECTOMY    . COLONOSCOPY    . CORONARY ANGIOPLASTY     DES to mid and distal RCA 05/15/10  . ROTATOR CUFF REPAIR Left 10/20/2013   Dr August Saucer  . SHOULDER ARTHROSCOPY WITH OPEN ROTATOR CUFF REPAIR Left 10/20/2013   Procedure: SHOULDER ARTHROSCOPY WITH OPEN ROTATOR CUFF REPAIR;  Surgeon: Cammy Copa, MD;  Location: [facility] OR;  Service: Orthopedics;  Laterality: Left;  Left shoulder arthroscopy, debridement, open biceps tenodesis and rotator cuff repair.    Family History  Problem Relation Age of Onset  . Heart disease Father   . Heart attack Other   . Sudden death Paternal Uncle        possible MI  . Colon cancer Neg Hx    Social History:  reports that he quit smoking about 52 years ago. His smoking use included cigarettes. He has never used smokeless tobacco. He reports that he does not drink alcohol and does not use drugs.  Allergies: No Known Allergies  Medications Prior to Admission  Medication Sig Dispense Refill  . APPLE CIDER  VINEGAR PO Take 1,200 mg by mouth daily.    Marland Kitchen ascorbic acid (VITAMIN C) 500 MG tablet Take 500 mg by mouth daily.    Marland Kitchen atorvastatin (LIPITOR) 40 MG tablet TAKE 1 TABLET(40 MG) BY MOUTH DAILY AT 6 PM (Patient taking differently: Take 40 mg by mouth every evening.) 90 tablet 3  . cetirizine (ZYRTEC) 10 MG tablet Take 10 mg by mouth at bedtime.    . cholecalciferol (VITAMIN D) 25 MCG (1000 UNIT) tablet Take 1,000 Units by mouth in the morning and at bedtime.    Marland Kitchen ibuprofen (ADVIL) 200 MG tablet Take 400 mg by mouth every 8 (eight) hours as needed (pain.).    Marland Kitchen losartan (COZAAR) 50 MG tablet TAKE 1 TABLET(50 MG) BY MOUTH DAILY (Patient taking differently: Take 50 mg by mouth daily.) 90 tablet 3  . metFORMIN (GLUCOPHAGE) 850 MG tablet TAKE 1 TABLET BY MOUTH TWICE DAILY THEREAFTER (Patient taking differently: Take 850 mg by mouth 2 (two) times daily with a meal.) 60 tablet 3  . metoprolol tartrate (LOPRESSOR) 25 MG tablet TAKE 1/2 TABLET(12.5 MG) BY MOUTH TWICE DAILY (Patient taking differently: Take 12.5 mg by mouth 2 (two) times daily.) 90 tablet 3  . Multiple Vitamin (MULTIVITAMIN WITH MINERALS) TABS tablet Take 1 tablet by mouth daily.    . Zinc 50 MG TABS Take 50 mg by mouth daily.    Marland Kitchen  aspirin EC 81 MG tablet Take 81 mg by mouth daily.    . fluticasone (FLONASE) 50 MCG/ACT nasal spray Place 2 sprays into both nostrils daily. (Patient not taking: Reported on 11/25/2020) 16 g 6  . nitroGLYCERIN (NITROSTAT) 0.4 MG SL tablet Place 1 tablet (0.4 mg total) under the tongue every 5 (five) minutes as needed for chest pain. 25 tablet 5    Results for orders placed or performed during the hospital encounter of 12/06/20 (from the past 48 hour(s))  Glucose, capillary     Status: Abnormal   Collection Time: 12/06/20  6:07 AM  Result Value Ref Range   Glucose-Capillary 141 (H) 70 - 99 mg/dL    Comment: Glucose reference range applies only to samples taken after fasting for at least 8 hours.   No results  found.  Review of Systems  Musculoskeletal: Positive for arthralgias.  All other systems reviewed and are negative.   Blood pressure 138/78, pulse 62, temperature 97.7 F (36.5 C), temperature source Oral, resp. rate 18, height 5\' 10"  (1.778 m), weight 96.2 kg, SpO2 99 %. Physical Exam Vitals reviewed.  HENT:     Head: Normocephalic.     Nose: Nose normal.     Mouth/Throat:     Mouth: Mucous membranes are moist.  Eyes:     Pupils: Pupils are equal, round, and reactive to light.  Cardiovascular:     Rate and Rhythm: Normal rate.     Pulses: Normal pulses.  Pulmonary:     Effort: Pulmonary effort is normal.  Abdominal:     General: Abdomen is flat.  Musculoskeletal:     Cervical back: Normal range of motion.  Skin:    General: Skin is warm.     Capillary Refill: Capillary refill takes less than 2 seconds.  Neurological:     General: No focal deficit present.     Mental Status: He is alert.  Psychiatric:        Mood and Affect: Mood normal.   Right shoulder exam demonstrates functional deltoid.  Motor sensory function to the hand is intact.  Radial pulse intact.  Patient has forward flexion abduction both above 90 degrees but weakness to supraspinatus and infraspinatus testing.  Subscap strength intact.  Radial pulse intact.  Assessment/Plan Impression is right shoulder rotator cuff arthropathy.  Patient is pretty functional but has significant pain on a daily basis.  After failure of conservative management the operative plan at this time is reverse shoulder replacement.  The risk and benefits are discussed with the patient including not limited to infection nerve vessel damage dislocation incomplete pain relief as well as some loss of function.  Patient understands the risk and benefits and wishes to proceed.  The rehabilitative process also described.  All questions answered  , MD 12/06/2020, 7:03 AM

## 2020-12-06 NOTE — Anesthesia Procedure Notes (Signed)
Procedure Name: Intubation Date/Time: 12/06/2020 7:42 AM Performed by: Waynard Edwards, CRNA Pre-anesthesia Checklist: Patient identified, Emergency Drugs available, Suction available and Patient being monitored Patient Re-evaluated:Patient Re-evaluated prior to induction Oxygen Delivery Method: Circle system utilized Preoxygenation: Pre-oxygenation with 100% oxygen Induction Type: IV induction Ventilation: Mask ventilation without difficulty Laryngoscope Size: Miller and 2 Grade View: Grade II Tube type: Oral Tube size: 7.5 mm Number of attempts: 1 Airway Equipment and Method: Stylet Placement Confirmation: ETT inserted through vocal cords under direct vision,  positive ETCO2 and breath sounds checked- equal and bilateral Secured at: 23 cm Tube secured with: Tape Dental Injury: Teeth and Oropharynx as per pre-operative assessment

## 2020-12-06 NOTE — Transfer of Care (Signed)
Immediate Anesthesia Transfer of Care Note  Patient: Paul Rubio  Procedure(s) Performed: RIGHT REVERSE SHOULDER ARTHROPLASTY (Right Shoulder)  Patient Location: PACU  Anesthesia Type:GA combined with regional for post-op pain  Level of Consciousness: drowsy and patient cooperative  Airway & Oxygen Therapy: Patient Spontanous Breathing and Patient connected to nasal cannula oxygen  Post-op Assessment: Report given to RN and Post -op Vital signs reviewed and stable  Post vital signs: Reviewed and stable  Last Vitals:  Vitals Value Taken Time  BP 117/73 12/06/20 1118  Temp    Pulse 66 12/06/20 1120  Resp 14 12/06/20 1120  SpO2 97 % 12/06/20 1120  Vitals shown include unvalidated device data.  Last Pain:  Vitals:   12/06/20 0629  TempSrc:   PainSc: 5       Patients Stated Pain Goal: 3 (12/06/20 0677)  Complications: No complications documented.

## 2020-12-06 NOTE — Anesthesia Procedure Notes (Signed)
Anesthesia Regional Block: Interscalene brachial plexus block   Pre-Anesthetic Checklist: ,, timeout performed, Correct Patient, Correct Site, Correct Laterality, Correct Procedure, Correct Position, site marked, Risks and benefits discussed,  Surgical consent,  Pre-op evaluation,  At surgeon's request and post-op pain management  Laterality: Right  Prep: chloraprep       Needles:  Injection technique: Single-shot  Needle Type: Echogenic Stimulator Needle     Needle Length: 9cm  Needle Gauge: 21     Additional Needles:   Procedures:, nerve stimulator,,, ultrasound used (permanent image in chart),,,,   Nerve Stimulator or Paresthesia:  Response: deltoid and bicep, 0.5 mA,   Additional Responses:   Narrative:  Start time: 12/06/2020 7:00 AM End time: 12/06/2020 7:07 AM Injection made incrementally with aspirations every 5 mL.  Performed by: Personally  Anesthesiologist: Marcene Duos, MD

## 2020-12-07 ENCOUNTER — Observation Stay (HOSPITAL_COMMUNITY): Payer: Medicare Other

## 2020-12-07 ENCOUNTER — Encounter (HOSPITAL_COMMUNITY): Payer: Self-pay | Admitting: Orthopedic Surgery

## 2020-12-07 DIAGNOSIS — J189 Pneumonia, unspecified organism: Secondary | ICD-10-CM | POA: Diagnosis present

## 2020-12-07 DIAGNOSIS — J9811 Atelectasis: Secondary | ICD-10-CM | POA: Diagnosis not present

## 2020-12-07 DIAGNOSIS — R0602 Shortness of breath: Secondary | ICD-10-CM | POA: Diagnosis not present

## 2020-12-07 LAB — GLUCOSE, CAPILLARY
Glucose-Capillary: 258 mg/dL — ABNORMAL HIGH (ref 70–99)
Glucose-Capillary: 144 mg/dL — ABNORMAL HIGH (ref 70–99)
Glucose-Capillary: 150 mg/dL — ABNORMAL HIGH (ref 70–99)

## 2020-12-07 MED ORDER — SODIUM CHLORIDE 0.9 % IV SOLN
1.5000 g | Freq: Four times a day (QID) | INTRAVENOUS | Status: DC
  Administered 2020-12-07 – 2020-12-09 (×6): 1.5 g via INTRAVENOUS
  Filled 2020-12-07 (×2): qty 4
  Filled 2020-12-07: qty 1.5
  Filled 2020-12-07 (×6): qty 4

## 2020-12-07 MED ORDER — IOHEXOL 350 MG/ML SOLN
75.0000 mL | Freq: Once | INTRAVENOUS | Status: AC | PRN
  Administered 2020-12-07: 75 mL via INTRAVENOUS

## 2020-12-07 NOTE — Care Management Obs Status (Signed)
MEDICARE OBSERVATION STATUS NOTIFICATION   Patient Details  Name: Paul Rubio MRN: [id number] Date of Birth: [birth year]   Medicare Observation Status Notification Given:  Yes    Mearl Latin, LCSW 12/07/2020, 12:14 PM

## 2020-12-07 NOTE — Evaluation (Signed)
Physical Therapy Evaluation Patient Details Name: Paul Rubio MRN: [id number] DOB: [birth year] Today's Date: 12/07/2020   History of Present Illness  Lennie is a 70 year old patient with long history of right shoulder pain.  He has irreparable rotator cuff tendon tears.  MRI scan shows retracted rotator cuff tear supraspinatus and infraspinatus with atrophy. Pt underwent R reverse shoulder replacement on 1/4.  Clinical Impression  PTA, patient was independent with mobility. Patient presents with unsteadiness with standing and ambulation. Upon arrival, patient on RA with spO2 77%, applied 2L O2 [place] with recovery to 91%. See General Comments for spO2 during ambulation and resting. Patient requires minA in standing and ambulation for balance. Patient presents with deficits in balance, strength, and endurance. Patient will benefit from skilled PT services during acute stay to address listed deficits. Recommend HHPT following discharge if balance deficits continue to be evident.     Follow Up Recommendations Home health PT;Supervision for mobility/OOB    Equipment Recommendations  None recommended by PT    Recommendations for Other Services       Precautions / Restrictions Precautions Precautions: Fall;Shoulder Type of Shoulder Precautions: reverse Shoulder Interventions: Shoulder sling/immobilizer;At all times;Off for dressing/bathing/exercises Precaution Booklet Issued: Yes (comment) Precaution Comments: watch O2 sats Required Braces or Orthoses: Sling Restrictions Weight Bearing Restrictions: Yes RUE Weight Bearing: Non weight bearing      Mobility  Bed Mobility               General bed mobility comments: in recliner on arrival    Transfers Overall transfer level: Needs assistance Equipment used: None Transfers: Sit to/from [employer] Sit to Stand: Min assist         General transfer comment: min guard-minA for safety due to increased unsteadiness  with standing  Ambulation/Gait Ambulation/Gait assistance: Min assist Gait Distance (Feet): 10 Feet Assistive device: None Gait Pattern/deviations: Step-to pattern;Decreased stride length;Staggering right;Staggering left;Wide base of support     General Gait Details: increased unsteadiness with minA for balance  Stairs            Wheelchair Mobility    Modified Rankin (Stroke Patients Only)       Balance Overall balance assessment: Needs assistance Sitting-balance support: No upper extremity supported;Feet supported Sitting balance-Leahy Scale: Fair     Standing balance support: No upper extremity supported Standing balance-Leahy Scale: Poor Standing balance comment: unable to maintain balance without minA                             Pertinent Vitals/Pain Pain Assessment: Faces Faces Pain Scale: Hurts little more Pain Location: R shoulder Pain Descriptors / Indicators: Grimacing;Guarding Pain Intervention(s): Monitored during session;Repositioned    Home Living Family/patient expects to be discharged to:: Private residence Living Arrangements: Spouse/significant other Available Help at Discharge: Family;Available 24 hours/day Type of Home: House Home Access: Stairs to enter Entrance Stairs-Rails: Left Entrance Stairs-Number of Steps: 4 Home Layout: One level Home Equipment: Shower seat;Grab bars - tub/shower;Hand held shower head;Cane - quad Additional Comments: Has other family that live nearby    Prior Function Level of Independence: Independent         Comments: Pt works as a Engineer, maintenance, independent in all tasks     Hand Dominance   Dominant Hand: Right    Extremity/Trunk Assessment   Upper Extremity Assessment Upper Extremity Assessment: Defer to OT evaluation    Lower Extremity Assessment Lower Extremity Assessment: Generalized weakness  Cervical / Trunk Assessment Cervical / Trunk Assessment: Normal  Communication    Communication: No difficulties  Cognition Arousal/Alertness: Awake/alert Behavior During Therapy: WFL for tasks assessed/performed Overall Cognitive Status: Within Functional Limits for tasks assessed                                        General Comments General comments (skin integrity, edema, etc.): Upon arrival, patient on RA with spO2 77%, put O2 [place] on 2L with recovery to 91%. Drop in O2 while sitting to low 60s with recovery to 90% after instruction of pursed lip breathing. Following sit to stand transfer, spO2 95%. Following 10' ambulation, spO2 dropped to 83%. Increased O2 to 2.5L with patient maintaining >93% prior to leaving.    Exercises     Assessment/Plan    PT Assessment Patient needs continued PT services  PT Problem List Decreased strength;Decreased activity tolerance;Decreased balance;Decreased mobility;Decreased safety awareness       PT Treatment Interventions DME instruction;Gait training;Functional mobility training;Stair training;Therapeutic activities;Balance training;Therapeutic exercise;Patient/family education    PT Goals (Current goals can be found in the Care Plan section)  Acute Rehab PT Goals Patient Stated Goal: return to independence, work PT Goal Formulation: With patient/family Time For Goal Achievement: 12/21/20 Potential to Achieve Goals: Good    Frequency Min 3X/week   Barriers to discharge        Co-evaluation               AM-PAC PT "6 Clicks" Mobility  Outcome Measure Help needed turning from your back to your side while in a flat bed without using bedrails?: A Little Help needed moving from lying on your back to sitting on the side of a flat bed without using bedrails?: A Little Help needed moving to and from a bed to a chair (including a wheelchair)?: A Little Help needed standing up from a chair using your arms (e.g., wheelchair or bedside chair)?: A Little Help needed to walk in hospital room?: A  Little Help needed climbing 3-5 steps with a railing? : A Lot 6 Click Score: 17    End of Session Equipment Utilized During Treatment: Gait belt;Oxygen Activity Tolerance: Patient tolerated treatment well Patient left: in chair;with call bell/phone within reach;with family/visitor present Nurse Communication: Mobility status;Other (comment) (oxygen requirements) PT Visit Diagnosis: Unsteadiness on feet (R26.81);Muscle weakness (generalized) (M62.81);Other abnormalities of gait and mobility (R26.89)    Time: 8466-5993 PT Time Calculation (min) (ACUTE ONLY): 27 min   Charges:   PT Evaluation $PT Eval Moderate Complexity: 1 Mod          Nizhoni Parlow A. Dan Humphreys PT, DPT Acute Rehabilitation Services Pager [phone] Office [phone]   Elissa Lovett 12/07/2020, 11:34 AM

## 2020-12-07 NOTE — Progress Notes (Signed)
  Subjective: Paul Rubio is a 70 y.o. male s/p right RSA.  They are POD 1.  Pt's pain is moderate but controlled.  Block wearing off.  Decreased oxygen saturation noted and patient placed on 2 L nasal cannula of oxygen.  Oxygen saturation on 2 L up to 93% and ranging up to 99% but generally around 93 to 94%.  Denies any symptomatic chest discomfort, chest pain, shortness of breath, dyspnea.    Objective: Vital signs in last 24 hours: Temp:  [97.5 F (36.4 C)-100 F (37.8 C)] 98.6 F (37 C) (01/05 0842) Pulse Rate:  [69-95] 95 (01/05 0842) Resp:  [16-18] 18 (01/05 0842) BP: (104-120)/(60-81) 108/81 (01/05 0842) SpO2:  [93 %-99 %] 94 % (01/05 0855)  Intake/Output from previous day: 01/04 0701 - 01/05 0700 In: 1744.3 [P.O.:440; I.V.:904.3; IV Piggyback:400] Out: 450 [Urine:300; Blood:150] Intake/Output this shift: Total I/O In: 240 [P.O.:240] Out: -   Exam:  No gross blood or drainage overlying the dressing 2+ radial pulse Sensation intact distally in the right hand Able to extend the right wrist Axillary nerve intact with deltoid firing   Labs: No results for input(s): HGB in the last 72 hours. No results for input(s): WBC, RBC, HCT, PLT in the last 72 hours. No results for input(s): NA, K, CL, CO2, BUN, CREATININE, GLUCOSE, CALCIUM in the last 72 hours. No results for input(s): LABPT, INR in the last 72 hours.  Assessment/Plan: Pt is POD 1 s/p right RSA    -Plan to discharge to home in coming days pending patient's pain and O2 sat  -Chest x-ray revealed right lung basilar atelectasis  -Ordered CT chest to evaluate for pulmonary embolism  -No lifting with the operative arm.  Manju Kulkarni L Herold Salguero 12/07/2020, 1:59 PM

## 2020-12-07 NOTE — Progress Notes (Signed)
Pt stable On 2lo2 Shoulder pain manageable rll asp pneumonia vs pneumonitis, no PE  thx for med consult recs Iv abx and pulse ox Dc Thursday vs Friday

## 2020-12-07 NOTE — Op Note (Signed)
NAME: Paul Rubio, Paul Rubio MEDICAL RECORD [id number] ACCOUNT [id number] DATE OF BIRTH:06-22-1951 FACILITY: [facility] LOCATION: [facility] PHYSICIAN:Remon Quinto Diamantina Providence, MD  OPERATIVE REPORT  DATE OF PROCEDURE:  12/06/2020  PREOPERATIVE DIAGNOSIS:  Right shoulder rotator cuff arthropathy.  POSTOPERATIVE DIAGNOSIS:  Right shoulder rotator cuff arthropathy.  PROCEDURE:  Right shoulder reverse shoulder replacement utilizing Bi[employer] implants including mini humeral stem size 14, with mini humeral tray, standard thickness +3 taper offset, 40 mm in diameter with +3 thickness, 36 mm diameter highly cross-linked  polyethylene bearing, 36 mm +3 offset glenosphere with small augmented baseplate and 1 central compression screw and 4 peripheral locking screws.  SURGEON:  Cammy Copa, MD  ASSISTANT:  Karenann Cai, PA  INDICATIONS:  The patient is a 70 year old patient with right shoulder pain.  Failed conservative management.  Rotator cuff arthropathy is present.  Presents now for operative management after explanation of risks and benefits.  PROCEDURE IN DETAIL:  The patient was brought to the operating room where general endotracheal anesthesia was induced.  Preoperative antibiotics administered.  Timeout was called.  The patient was placed in the beach chair position with head in neutral  position.  Right arm was examined under anesthesia, found to have pretty reasonable passive range of motion with about external rotation to 15 degrees, about 45 degrees bilaterally.  The right shoulder, arm and hand prescrubbed with hydrogen peroxide  followed by alcohol and then Betadine, which was allowed to air dry.  Then, the shoulder was prepped with ChloraPrep solution and draped in a sterile manner.  Ioban used to cover the operative field including the axilla.  Timeout was called.   Deltopectoral approach was made.  Skin and subcutaneous tissue were sharply divided.  The patient's cephalic vein was mobilized  medially, but sustained a tear from a tributary and it was ligated.  Deltoid was then elevated manually off the anterior  attachment distally.  Biceps tendon had subluxated medially and was tenodesed to the pec tendon.  This was done with four 0 Vicryl sutures.  Rotator interval was then opened.  The patient had significant rotator cuff tearing of the infraspinatus and  supraspinatus.  Subscap was also substantially torn.  The axillary nerve was then palpated and protected at all times during the case.  The circumflex vessels were ligated.  Subscap was detached and tagged with 0 Vicryl sutures.  Circumferential release  around to the 5 o'clock position on the humeral head was then performed.  Head was dislocated.  Sequential reaming was performed up to a size 14.  The head was then cut in 30 degrees of retroversion back to the lateral aspect of the humeral head, rotator  cuff attachment site.  Broaching was then performed up to size 14 and a cap was placed.  Attention then directed towards the glenoid.  Rotator interval was released to the base of the glenoid.  Capsule was then released circumferentially.  Care was  taken to avoid injury to axillary nerve.  Next, the guide was placed and in accordance with preoperative templating, the glenoid was reamed.  A small augment was placed around the 11 o'clock position.  Reaming for the augment was performed as well.  The  baseplate was then placed with excellent purchase obtained with the central screw for fixation followed by 4 peripheral locking screws.  Next, spacing between the glenosphere and the humeral head plate was assessed and it was believed that the patient  could accommodate a +3 glenosphere.  This was placed with 1.5  mm inferior offset.  Next, attention was directed towards the humeral side.  The 14 broach was removed.  True 14 stem was placed after drilling holes for subscap repair.  Then, subsequent  trialing was performed and the most stable  implant was the +3 offset, +3 liner.  This gave very good stability and lateralization.  Hard to reduce and hard to dislocate.  The patient had very good stability to extension, adduction and forward force.  Had  excellent range of motion with forward flexion and abduction.  Trial components removed and true components placed in terms of the baseplate and the liner.  Same stability parameters were maintained.  Axillary nerve again palpated and found to be  intact.  Thorough irrigation was performed.  IrriSept was used after the first incision as well as with the deltopectoral split.  Utilized at all times during the case.  The subscapularis was then repaired in about 30 degrees of external rotation using  the 4 suture tapes.  This gave [place] a secure repair, which did not adversely affect range of motion.  Next, vancomycin powder was placed and the deltopectoral split was closed using #1 Vicryl suture, followed by irrigation, vancomycin powder and then  closure using 0 Vicryl suture, 2-0 Vicryl suture and a 3-0 Monocryl.  Steri-Strips applied.  The patient tolerated the procedure well without immediate complications.  Aquacel dressing applied.  Luke's assistance was required for opening and closing,  mobilization of tissue.  His assistance was a medical necessity.  The patient tolerated the procedure well without immediate complications.  He was transferred to the recovery room in stable condition.  HN/NUANCE  D:12/06/2020 T:12/06/2020 JOB:[id number]

## 2020-12-07 NOTE — Evaluation (Signed)
Occupational Therapy Evaluation/Discharge Patient Details Name: Paul Rubio MRN: [id number] DOB: 1951-02-27 Today's Date: 12/07/2020    History of Present Illness Claudio is a 70 year old patient with long history of right shoulder pain.  He has irreparable rotator cuff tendon tears.  MRI scan shows retracted rotator cuff tear supraspinatus and infraspinatus with atrophy. Pt underwent R reverse shoulder replacement on 1/4.   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 Pt very independent, active and no use of AD. Pt presents now with mild deficits in pain, balance, cardiopulmonary tolerance and impaired use of R-dominant UE. Extended time spent educating pt on shoulder precautions during ADLs, sling use, appropriate exercises for R UE and safety at home. Pt engaged throughout session and requires Min A for bed mobility, min guard for transfers without AD. Pt requires Mod A for UB ADLs and Min A for LB ADLs due to deficits. Pt noted with good problem solving and reports wife will be able to provide 24/7 assist for ADLs/IADLs at home. Anticipate pt to progress well towards independence. Defer all skilled OT service needs to [facility]. OT to sign off at this time.   Pt does not wear O2 at baseline. Pt noted with SpO2 85% at rest on RA. With 2 L O2, maintained 89-92% during session.     Follow Up Recommendations  Home health OT;Supervision/Assistance - 24 hour    Equipment Recommendations  None recommended by OT (appears to have all needed DME)    Recommendations for Other Services       Precautions / Restrictions Precautions Precautions: Fall;Shoulder Type of Shoulder Precautions: reverse Shoulder Interventions: Shoulder sling/immobilizer;At all times;Off for dressing/bathing/exercises Precaution Booklet Issued: Yes (comment) Required Braces or Orthoses: Sling Restrictions Weight Bearing Restrictions: Yes RUE Weight Bearing: Non weight bearing       Mobility Bed Mobility Overal bed mobility: Needs Assistance Bed Mobility: Supine to Sit     Supine to sit: Min assist     General bed mobility comments: Min A to advancement of trunk sitting upright to avoid WB through shoulder.    Transfers Overall transfer level: Needs assistance Equipment used: None Transfers: Sit to/from [employer] Sit to Stand: Min guard Stand pivot transfers: Supervision       General transfer comment: min guard progressing to supervision with increased activity. Able to take steps to recliner without AD    Balance Overall balance assessment: Mild deficits observed, not formally tested                                         ADL either performed or assessed with clinical judgement   ADL Overall ADL's : Needs assistance/impaired Eating/Feeding: Set up;Sitting   Grooming: Minimal assistance;Sitting Grooming Details (indicate cue type and reason): Min A for bimanual tasks due to R hand dominant, good problem solving noted Upper Body Bathing: Moderate assistance;Sitting   Lower Body Bathing: Min guard;Sit to/from stand;Sitting/lateral leans   Upper Body Dressing : Moderate assistance;Sitting Upper Body Dressing Details (indicate cue type and reason): Unable to fully assess UB dressing due to IV. Pt able to assist in sling mgmt. Discussed easier clothing to don Lower Body Dressing: Minimal assistance;Sit to/from stand Lower Body Dressing Details (indicate cue type and reason): Assistance to don underwear around R waist, improving with ability to manage pants Toilet Transfer: Min Manufacturing systems engineer Details (  indicate cue type and reason): simulated to recliner Toileting- Clothing Manipulation and Hygiene: Minimal assistance;Sit to/from stand         General ADL Comments: Pt with improving problem solving through completion of ADLs. Educated on shoulder precautions during ADLs with wife to assist at  home as needed     Vision Patient Visual Report: No change from baseline Vision Assessment?: No apparent visual deficits     Perception     Praxis      Pertinent Vitals/Pain Pain Assessment: Faces Faces Pain Scale: Hurts little more Pain Location: R shoulder Pain Descriptors / Indicators: Grimacing;Guarding Pain Intervention(s): Monitored during session;Limited activity within patient's tolerance;Repositioned;Ice applied     Hand Dominance Right   Extremity/Trunk Assessment Upper Extremity Assessment Upper Extremity Assessment: RUE deficits/detail RUE Deficits / Details: R UE in sling s/p reverse shld replacement. hand, wrist and elbow ROM WFL RUE: Unable to fully assess due to pain;Unable to fully assess due to immobilization RUE Sensation: WNL RUE Coordination: decreased gross motor   Lower Extremity Assessment Lower Extremity Assessment: Defer to PT evaluation   Cervical / Trunk Assessment Cervical / Trunk Assessment: Normal   Communication Communication Communication: No difficulties   Cognition Arousal/Alertness: Awake/alert Behavior During Therapy: WFL for tasks assessed/performed Overall Cognitive Status: Within Functional Limits for tasks assessed                                     General Comments       Exercises Exercises: Shoulder Shoulder Exercises Pendulum Exercise: Right;5 reps;Seated (seated dangle) Elbow Flexion: Right;5 reps;Seated;AROM Elbow Extension: AROM;Right;5 reps;Seated Wrist Flexion: AROM;Right;5 reps;Seated Wrist Extension: AROM;Right;5 reps;Seated Digit Composite Flexion: AROM;Right;5 reps;Seated Composite Extension: AROM;Right;5 reps;Seated   Shoulder Instructions Shoulder Instructions Donning/doffing shirt without moving shoulder: Moderate assistance Method for sponge bathing under operated UE: Moderate assistance Donning/doffing sling/immobilizer: Moderate assistance Correct positioning of sling/immobilizer:  Supervision/safety Pendulum exercises (written home exercise program): Supervision/safety ROM for elbow, wrist and digits of operated UE: Independent Sling wearing schedule (on at all times/off for ADL's): Supervision/safety Proper positioning of operated UE when showering: Supervision/safety Positioning of UE while sleeping: Supervision/safety    Home Living Family/patient expects to be discharged to:: Private residence Living Arrangements: Spouse/significant other Available Help at Discharge: Family;Available 24 hours/day Type of Home: House Home Access: Stairs to enter [employer] of Steps: 4 Entrance Stairs-Rails: Left Home Layout: One level     Bathroom Shower/Tub: Producer, television/film/video: Handicapped height     Home Equipment: Shower seat;Grab bars - tub/shower;Hand held shower head;Cane - quad   Additional Comments: Has other family that live nearby      Prior Functioning/Environment Level of Independence: Independent        Comments: Pt works as a Engineer, maintenance, independent in all tasks        OT Problem List: Decreased strength;Decreased range of motion;Impaired balance (sitting and/or standing);Decreased knowledge of precautions;Cardiopulmonary status limiting activity;Pain;Impaired UE functional use      OT Treatment/Interventions:      OT Goals(Current goals can be found in the care plan section) Acute Rehab OT Goals Patient Stated Goal: return to independence, work OT Goal Formulation: All assessment and education complete, DC therapy  OT Frequency:     Barriers to D/C:            Co-evaluation              AM-PAC  OT "6 Clicks" Daily Activity     Outcome Measure Help from another person eating meals?: A Little Help from another person taking care of personal grooming?: A Little Help from another person toileting, which includes using toliet, bedpan, or urinal?: A Little Help from another person bathing (including  washing, rinsing, drying)?: A Lot Help from another person to put on and taking off regular upper body clothing?: A Lot Help from another person to put on and taking off regular lower body clothing?: A Little 6 Click Score: 16   End of Session Equipment Utilized During Treatment: Other (comment);Oxygen (sling) Nurse Communication: Mobility status;Precautions;Other (comment) (O2)  Activity Tolerance: Patient tolerated treatment well Patient left: in chair;with call bell/phone within reach;Other (comment) (with PA present)  OT Visit Diagnosis: Unsteadiness on feet (R26.81);Muscle weakness (generalized) (M62.81);Pain Pain - Right/Left: Right Pain - part of body: Shoulder                Time: 3354-5625 OT Time Calculation (min): 46 min Charges:  OT General Charges $OT Visit: 1 Visit OT Evaluation $OT Eval Low Complexity: 1 Low OT Treatments $Self Care/Home Management : 8-22 mins $Therapeutic Exercise: 8-22 mins  Layla Maw, OTR/L  Layla Maw 12/07/2020, 8:32 AM

## 2020-12-07 NOTE — Consult Note (Signed)
[facility] Medical Consultation  Paul Rubio YQM:[id number] DOB: June 28, 1951 DOA: 12/06/2020 PCP: Susy Frizzle, MD   Requesting physician: Dr. Marlou Sa, orthopedics Date of consultation: 12/07/2020 Reason for consultation: Medical management  Impression/Recommendations Active Problems:   S/P reverse total shoulder arthroplasty, right   Right lower lobe pneumonia  Acute respiratory failure with hypoxia secondary to right lower lobe pneumonia: Suspect aspiration pneumonia versus pneumonitis as seen on CTA chest.  No evidence of PE.  Associated atelectasis also noted.  Per PT documentation, SPO2 down to 77% on room air while at rest.  Currently requiring 2 L of O2 via [place] with improved O2 saturations.  He has been afebrile without evidence of sepsis physiology at this time. -Start IV Unasyn, anticipate that we can switch to oral antibiotics once SPO2 is improving to >88% on room air -Continue supplemental O2 and wean as able -Continue incentive spirometer -Place on continuous pulse ox  S/p right shoulder arthroplasty 12/06/2020: Continue PT/OT and pain management per orthopedics.  CAD s/p PCI: Continue aspirin and atorvastatin.  Hypertension: Continue Lopressor and losartan.  Type 2 diabetes: Continue Metformin with sliding scale insulin.  Hyperlipidemia: Continue atorvastatin.  TRH will followup again tomorrow. Please contact us if we can be of assistance in the meanwhile. Thank you for this consultation.  Chief Complaint: Hypoxia  HPI:  [name] is a 70 year old male with medical history significant for CAD s/p PCI, T2DM, HTN, HLD who was admitted on 12/06/2018 2200 orthopedic service for right reverse shoulder arthroplasty.  While working with PT earlier today he was noted to be hypoxic with SPO2 down to 77% on room air.  He was placed on 2 L O2 via [place] with improvement.  Patient states he has not been short of breath but has had a mild cough with occasional clear  sputum production.  He says his cough has been ongoing for about 1 week.  He otherwise denies any subjective fevers, chills, diaphoresis, chest pain, abdominal pain.  Review of Systems:  All systems reviewed and are negative except as documented in history of present illness above.  Past Medical History:  Diagnosis Date  . Coronary artery disease 2011    2 stents placed  . H/O hiatal hernia   . Hyperlipidemia    borderline; status post stress test x2, the last one 5-6 years ago and without significant abnormality per the patient  . Hypertension    for approximately 5 years  . Myocardial infarction ([place]) 2011  . Pre-diabetes   . Prediabetes    Past Surgical History:  Procedure Laterality Date  . CHOLECYSTECTOMY    . COLONOSCOPY    . CORONARY ANGIOPLASTY     DES to mid and distal RCA 05/15/10  . REVERSE SHOULDER ARTHROPLASTY Right 12/06/2020   Procedure: RIGHT REVERSE SHOULDER ARTHROPLASTY;  Surgeon: Meredith Pel, MD;  Location: [facility];  Service: Orthopedics;  Laterality: Right;  . Right shoulder rotator cuff arthropathy.  12/06/2020  . ROTATOR CUFF REPAIR Left 10/20/2013   Dr Marlou Sa  . SHOULDER ARTHROSCOPY WITH OPEN ROTATOR CUFF REPAIR Left 10/20/2013   Procedure: SHOULDER ARTHROSCOPY WITH OPEN ROTATOR CUFF REPAIR;  Surgeon: Meredith Pel, MD;  Location: [facility];  Service: Orthopedics;  Laterality: Left;  Left shoulder arthroscopy, debridement, open biceps tenodesis and rotator cuff repair.   Social History:  reports that he quit smoking about 52 years ago. His smoking use included cigarettes. He has never used smokeless tobacco. He reports that he does not drink  alcohol and does not use drugs.  No Known Allergies Family History  Problem Relation Age of Onset  . Heart disease Father   . Heart attack Other   . Sudden death Paternal Uncle        possible MI  . Colon cancer Neg Hx     Prior to Admission medications   Medication Sig Start Date End Date Taking? Authorizing  Provider  APPLE CIDER VINEGAR PO Take 1,200 mg by mouth daily.   Yes [provider]  ascorbic acid (VITAMIN C) 500 MG tablet Take 500 mg by mouth daily.   Yes [provider]  atorvastatin (LIPITOR) 40 MG tablet TAKE 1 TABLET(40 MG) BY MOUTH DAILY AT 6 PM Patient taking differently: Take 40 mg by mouth every evening. 10/31/20  Yes Tonny Bollman, MD  cetirizine (ZYRTEC) 10 MG tablet Take 10 mg by mouth at bedtime.   Yes [provider]  cholecalciferol (VITAMIN D) 25 MCG (1000 UNIT) tablet Take 1,000 Units by mouth in the morning and at bedtime.   Yes [provider]  ibuprofen (ADVIL) 200 MG tablet Take 400 mg by mouth every 8 (eight) hours as needed (pain.).   Yes [provider]  losartan (COZAAR) 50 MG tablet TAKE 1 TABLET(50 MG) BY MOUTH DAILY Patient taking differently: Take 50 mg by mouth daily. 10/31/20  Yes Tonny Bollman, MD  metFORMIN (GLUCOPHAGE) 850 MG tablet TAKE 1 TABLET BY MOUTH TWICE DAILY THEREAFTER Patient taking differently: Take 850 mg by mouth 2 (two) times daily with a meal. 08/22/20  Yes Donita Brooks, MD  metoprolol tartrate (LOPRESSOR) 25 MG tablet TAKE 1/2 TABLET(12.5 MG) BY MOUTH TWICE DAILY Patient taking differently: Take 12.5 mg by mouth 2 (two) times daily. 10/31/20  Yes Tonny Bollman, MD  Multiple Vitamin (MULTIVITAMIN WITH MINERALS) TABS tablet Take 1 tablet by mouth daily.   Yes [provider]  Zinc 50 MG TABS Take 50 mg by mouth daily.   Yes [provider]  aspirin EC 81 MG tablet Take 81 mg by mouth daily.    [provider]  fluticasone (FLONASE) 50 MCG/ACT nasal spray Place 2 sprays into both nostrils daily. Patient not taking: Reported on 11/25/2020 04/14/20   Donita Brooks, MD  nitroGLYCERIN (NITROSTAT) 0.4 MG SL tablet Place 1 tablet (0.4 mg total) under the tongue every 5 (five) minutes as needed for chest pain. 10/26/19   Corrin Parker, PA-C   Physical  Exam: Blood pressure 121/60, pulse 98, temperature 99.5 F (37.5 C), temperature source Oral, resp. rate 20, height 5\' 10"  (1.778 m), weight 96.2 kg, SpO2 95 %. Vitals:   12/07/20 0855 12/07/20 1429  BP:  121/60  Pulse:  98  Resp:  20  Temp:  99.5 F (37.5 C)  SpO2: 94% 95%   Constitutional: Resting supine in bed, NAD, calm, comfortable Eyes: PERRL, lids and conjunctivae normal ENMT: Mucous membranes are moist. Posterior pharynx clear of any exudate or lesions.Normal dentition.  Neck: normal, supple, no masses. Respiratory: Faint inspiratory crackles right midlung field otherwise clear to auscultation. Normal respiratory effort. No accessory muscle use.  Cardiovascular: Regular rate and rhythm, no murmurs / rubs / gallops. No extremity edema. 2+ pedal pulses. Abdomen: no tenderness, no masses palpated.  Bowel sounds positive.  Musculoskeletal: S/p right shoulder arthroplasty with right upper extremity in sling.  Good ROM of left upper and bilateral lower extremities, no contractures. Normal muscle tone.  Skin: no rashes, lesions, ulcers. No induration Neurologic:  CN 2-12 grossly intact. Sensation intact, Strength not assessed RUE due to postop status otherwise intact all other extremities. Psychiatric: Normal judgment and insight. Alert and oriented x 3. Normal mood.   Labs on Admission:  Basic Metabolic Panel: No results for input(s): NA, K, CL, CO2, GLUCOSE, BUN, CREATININE, CALCIUM, MG, PHOS in the last 168 hours. Liver Function Tests: No results for input(s): AST, ALT, ALKPHOS, BILITOT, PROT, ALBUMIN in the last 168 hours. No results for input(s): LIPASE, AMYLASE in the last 168 hours. No results for input(s): AMMONIA in the last 168 hours. CBC: No results for input(s): WBC, NEUTROABS, HGB, HCT, MCV, PLT in the last 168 hours. Cardiac Enzymes: No results for input(s): CKTOTAL, CKMB, CKMBINDEX, TROPONINI in the last 168 hours. BNP: Invalid input(s): POCBNP CBG: Recent Labs   Lab 12/06/20 1232 12/06/20 1722 12/07/20 0846 12/07/20 1138 12/07/20 1618  GLUCAP 157* 148* 258* 144* 150*    Radiological Exams on Admission: DG Chest 2 View  Result Date: 12/07/2020 CLINICAL DATA:  Low oxygen saturation, post RIGHT total shoulder arthroplasty yesterday EXAM: CHEST - 2 VIEW COMPARISON:  10/12/2013 FINDINGS: Normal heart size, mediastinal contours, and pulmonary vascularity. Subsegmental atelectasis at mid to lower RIGHT lung. Remaining lungs clear. No acute infiltrate, pleural effusion, or pneumothorax. RIGHT shoulder prosthesis noted. IMPRESSION: RIGHT basilar atelectasis. Electronically Signed   By: Ulyses Southward M.D.   On: 12/07/2020 09:44   CT ANGIO CHEST PE W OR WO CONTRAST  Result Date: 12/07/2020 CLINICAL DATA:  Shortness of breath. Right shoulder replacement 1 day prior EXAM: CT ANGIOGRAPHY CHEST WITH CONTRAST TECHNIQUE: Multidetector CT imaging of the chest was performed using the standard protocol during bolus administration of intravenous contrast. Multiplanar CT image reconstructions and MIPs were obtained to evaluate the vascular anatomy. CONTRAST:  77mL OMNIPAQUE IOHEXOL 350 MG/ML SOLN COMPARISON:  Chest radiograph December 07, 2020 FINDINGS: Cardiovascular: There is no demonstrable pulmonary embolus. There is no thoracic aortic aneurysm or dissection. Visualized great vessels appear normal. There is slight aortic atherosclerosis. There are foci of coronary artery calcification at multiple sites. There is no pericardial effusion or pericardial thickening. Mediastinum/Nodes: Visualized thyroid appears normal. No evident thoracic adenopathy. No esophageal lesions are appreciable. Lungs/Pleura: There is airspace consolidation consistent with pneumonia with superimposed atelectasis in the right lower lobe, primarily medially and posteriorly. There is atelectatic change in the left lower lobe. Atelectatic changes also noted in the right middle lobe. No appreciable pleural  effusions. Upper Abdomen: Gallbladder is absent. Visualized upper abdominal structures otherwise normal. Musculoskeletal: Total shoulder replacement on the right. There is a sizable joint effusion in the right shoulder area with multiple foci of air within the shoulder joint and tracking into the anterior right hemithorax consistent with recent surgery. Edema of the right pectoral muscles is likely of postoperative etiology. No blastic or lytic bone lesions are evident. No acute appearing fracture evident. Review of the MIP images confirms the above findings. IMPRESSION: 1. No demonstrable pulmonary embolus. No thoracic aortic aneurysm or dissection. Mild aortic atherosclerosis. Foci of coronary artery calcification noted. 2. Airspace consolidation consistent with pneumonia, likely with superimposed atelectasis in the right lower lobe. Areas of patchy atelectasis in the left lower lobe and right middle lobe also noted. 3.  No adenopathy. 4. Status post total shoulder replacement on the right with moderate fluid and extensive air in the right shoulder as well as tracking into the anterior right hemithorax consistent with recent surgery. Edema of the pectoralis muscles on the right is likely of  recent postoperative etiology. 5.  Absent gallbladder. Aortic Atherosclerosis (ICD10-I70.0). Electronically Signed   By: Bretta Bang III M.D.   On: 12/07/2020 17:46   DG Shoulder Right Port  Result Date: 12/06/2020 CLINICAL DATA:  Status post right shoulder replacement. EXAM: PORTABLE RIGHT SHOULDER COMPARISON:  None. FINDINGS: The right humeral and glenoid components appear to be well situated. No fracture or dislocation is noted. IMPRESSION: Status post right total shoulder arthroplasty. Electronically Signed   By: Lupita Raider M.D.   On: 12/06/2020 12:05    EKG: Not performed.  Darreld Mclean, MD [facility]   If 7PM-7AM, please contact night-coverage [email or web address] 12/07/2020, 7:57 PM

## 2020-12-08 DIAGNOSIS — J189 Pneumonia, unspecified organism: Secondary | ICD-10-CM | POA: Diagnosis present

## 2020-12-08 DIAGNOSIS — J9601 Acute respiratory failure with hypoxia: Secondary | ICD-10-CM

## 2020-12-08 DIAGNOSIS — Z96611 Presence of right artificial shoulder joint: Secondary | ICD-10-CM | POA: Diagnosis not present

## 2020-12-08 DIAGNOSIS — Z87891 Personal history of nicotine dependence: Secondary | ICD-10-CM | POA: Diagnosis not present

## 2020-12-08 DIAGNOSIS — M75100 Unspecified rotator cuff tear or rupture of unspecified shoulder, not specified as traumatic: Secondary | ICD-10-CM | POA: Diagnosis present

## 2020-12-08 DIAGNOSIS — I252 Old myocardial infarction: Secondary | ICD-10-CM | POA: Diagnosis not present

## 2020-12-08 DIAGNOSIS — Z955 Presence of coronary angioplasty implant and graft: Secondary | ICD-10-CM | POA: Diagnosis not present

## 2020-12-08 DIAGNOSIS — J690 Pneumonitis due to inhalation of food and vomit: Principal | ICD-10-CM

## 2020-12-08 DIAGNOSIS — Z8249 Family history of ischemic heart disease and other diseases of the circulatory system: Secondary | ICD-10-CM | POA: Diagnosis not present

## 2020-12-08 DIAGNOSIS — E119 Type 2 diabetes mellitus without complications: Secondary | ICD-10-CM | POA: Diagnosis present

## 2020-12-08 DIAGNOSIS — I10 Essential (primary) hypertension: Secondary | ICD-10-CM | POA: Diagnosis present

## 2020-12-08 DIAGNOSIS — Z7984 Long term (current) use of oral hypoglycemic drugs: Secondary | ICD-10-CM | POA: Diagnosis not present

## 2020-12-08 DIAGNOSIS — I2510 Atherosclerotic heart disease of native coronary artery without angina pectoris: Secondary | ICD-10-CM | POA: Diagnosis present

## 2020-12-08 DIAGNOSIS — E785 Hyperlipidemia, unspecified: Secondary | ICD-10-CM | POA: Diagnosis present

## 2020-12-08 DIAGNOSIS — Z79899 Other long term (current) drug therapy: Secondary | ICD-10-CM | POA: Diagnosis not present

## 2020-12-08 LAB — GLUCOSE, CAPILLARY
Glucose-Capillary: 122 mg/dL — ABNORMAL HIGH (ref 70–99)
Glucose-Capillary: 118 mg/dL — ABNORMAL HIGH (ref 70–99)
Glucose-Capillary: 134 mg/dL — ABNORMAL HIGH (ref 70–99)
Glucose-Capillary: 140 mg/dL — ABNORMAL HIGH (ref 70–99)

## 2020-12-08 LAB — CBC
HCT: 29.4 % — ABNORMAL LOW (ref 39.0–52.0)
Hemoglobin: 9.5 g/dL — ABNORMAL LOW (ref 13.0–17.0)
MCH: 29.1 pg (ref 26.0–34.0)
MCHC: 32.3 g/dL (ref 30.0–36.0)
MCV: 90.2 fL (ref 80.0–100.0)
Platelets: 260 10*3/uL (ref 150–400)
RBC: 3.26 MIL/uL — ABNORMAL LOW (ref 4.22–5.81)
RDW: 13.1 % (ref 11.5–15.5)
WBC: 11.9 10*3/uL — ABNORMAL HIGH (ref 4.0–10.5)
nRBC: 0 % (ref 0.0–0.2)

## 2020-12-08 NOTE — Progress Notes (Addendum)
Physical Therapy Treatment Patient Details Name: Paul Rubio MRN: [id number] DOB: [birth year] Today's Date: 12/08/2020    History of Present Illness Paul Rubio is a 70 year old patient with long history of right shoulder pain.  He has irreparable rotator cuff tendon tears.  MRI scan shows retracted rotator cuff tear supraspinatus and infraspinatus with atrophy. Pt underwent R reverse shoulder replacement on 1/4.    PT Comments    Patient progressing towards physical therapy goals with improved balance this session. Still drifts left/right with ambulation with no AD and states this is not baseline for him. Patient on 2L O2 on arrival with spO2 94%, removed O2 and patient able to maintain >94%. Performed walking oxygen saturation (see note). Patient ambulated 250' with no AD and min guard. Patient continues to be limited by cardiopulmonary status, impaired balance, generalized weakness. Recommend OPPT following discharge to address balance deficits and shoulder rehab.     Follow Up Recommendations  Outpatient PT (for shoulder and balance)     Equipment Recommendations  None recommended by PT    Recommendations for Other Services       Precautions / Restrictions Precautions Precautions: Fall;Shoulder Type of Shoulder Precautions: reverse Shoulder Interventions: Shoulder sling/immobilizer;At all times;Off for dressing/bathing/exercises Precaution Booklet Issued: Yes (comment) Precaution Comments: watch O2 sats Required Braces or Orthoses: Sling Restrictions Weight Bearing Restrictions: Yes RUE Weight Bearing: Non weight bearing    Mobility  Bed Mobility Overal bed mobility: Needs Assistance Bed Mobility: Supine to Sit;Sit to Supine     Supine to sit: Supervision Sit to supine: Supervision      Transfers Overall transfer level: Needs assistance Equipment used: None Transfers: Sit to/from Stand Sit to Stand: Supervision         General transfer comment: supervision for  safety, improved balance compared to previous session  Ambulation/Gait Ambulation/Gait assistance: Min guard Gait Distance (Feet): 250 Feet Assistive device: None Gait Pattern/deviations: Step-through pattern;Decreased stride length;Drifts right/left;Wide base of support     General Gait Details: improved balance compared to previous session. min guard for safety and balance   Stairs             Wheelchair Mobility    Modified Rankin (Stroke Patients Only)       Balance Overall balance assessment: Needs assistance Sitting-balance support: No upper extremity supported;Feet supported Sitting balance-Leahy Scale: Fair     Standing balance support: No upper extremity supported Standing balance-Leahy Scale: Fair                              Cognition Arousal/Alertness: Awake/alert Behavior During Therapy: WFL for tasks assessed/performed Overall Cognitive Status: Within Functional Limits for tasks assessed                                        Exercises      General Comments General comments (skin integrity, edema, etc.): Performed walking oxygen saturation (see note). Patient on 2L O2 Paul Rubio upon arrival with spO2 97%. Removed O2 with patient able to maintain spO2 >94%. During ambulation, patient O2 dropped to lowest 90% on RA. Able to recover to 93% in <30 seconds. Left patient on RA and notified RN      Pertinent Vitals/Pain Pain Assessment: Faces Faces Pain Scale: Hurts little more Pain Location: R shoulder Pain Descriptors / Indicators: Grimacing;Guarding Pain Intervention(s): Monitored during session  Home Living                      Prior Function            PT Goals (current goals can now be found in the care plan section) Acute Rehab PT Goals Patient Stated Goal: return to independence, work PT Goal Formulation: With patient/family Time For Goal Achievement: 12/21/20 Potential to Achieve Goals: Good Progress  towards PT goals: Progressing toward goals    Frequency    Min 3X/week      PT Plan Current plan remains appropriate    Co-evaluation              AM-PAC PT "6 Clicks" Mobility   Outcome Measure  Help needed turning from your back to your side while in a flat bed without using bedrails?: A Little Help needed moving from lying on your back to sitting on the side of a flat bed without using bedrails?: A Little Help needed moving to and from a bed to a chair (including a wheelchair)?: A Little Help needed standing up from a chair using your arms (e.g., wheelchair or bedside chair)?: A Little Help needed to walk in hospital room?: A Little Help needed climbing 3-5 steps with a railing? : A Little 6 Click Score: 18    End of Session Equipment Utilized During Treatment: Gait belt Activity Tolerance: Patient tolerated treatment well Patient left: in bed;with call bell/phone within reach;with family/visitor present Nurse Communication: Mobility status;Other (comment) (oxygen status) PT Visit Diagnosis: Unsteadiness on feet (R26.81);Muscle weakness (generalized) (M62.81);Other abnormalities of gait and mobility (R26.89)     Time: 0737-1062 PT Time Calculation (min) (ACUTE ONLY): 31 min  Charges:  $Therapeutic Activity: 23-37 mins                    Donnalynn Wheeless A. Dan Humphreys PT, DPT Acute Rehabilitation Services Pager [phone] Office [phone]    Elissa Lovett 12/08/2020, 3:05 PM

## 2020-12-08 NOTE — Progress Notes (Signed)
  Subjective: Paul Rubio is a 70 y.o. male s/p right RSA.  They are POD2.  Pt's pain is controlled.  Denies SOB, CP, fevers, chills.  Does state he feels "clammy" at times.    Objective: Vital signs in last 24 hours: Temp:  [99.2 F (37.3 C)-101.3 F (38.5 C)] 99.2 F (37.3 C) (01/06 1300) Pulse Rate:  [85-88] 85 (01/06 1300) Resp:  [17-18] 17 (01/06 1300) BP: (106-123)/(61-70) 120/64 (01/06 1300) SpO2:  [95 %-98 %] 95 % (01/06 1622)  Intake/Output from previous day: 01/05 0701 - 01/06 0700 In: 1300 [P.O.:1200; IV Piggyback:100] Out: 540 [Urine:540] Intake/Output this shift: Total I/O In: 480 [P.O.:480] Out: -   Exam:  No gross blood or drainage overlying the dressing 2+ radial pulse Sensation intact distally in the right hand Able to extend the right wrist, axillary nerve intact deltoid firing   Labs: Recent Labs    12/08/20 0155  HGB 9.5*   Recent Labs    12/08/20 0155  WBC 11.9*  RBC 3.26*  HCT 29.4*  PLT 260   No results for input(s): NA, K, CL, CO2, BUN, CREATININE, GLUCOSE, CALCIUM in the last 72 hours. No results for input(s): LABPT, INR in the last 72 hours.  Assessment/Plan: Pt is POD2 s/p right RSA    -Plan to discharge to home tomorrow pending patient's pain and O2 saturation.  -Patient able to ambulate without O2 today and O2 Sat of 94% on PT note.  Appreciate PT eval and Hospitalist follow. Plan for discharge home on oral Augmentin per medical recs.    -Fever of 101.3 this AM that has returned to afebrile this afternoon  -No lifting with the operative arm  -Okay to shower, dressing is waterproof.  Cautioned patient against soaking dressing in bath/pool/body of water    Julieanne Cotton 12/08/2020, 4:49 PM

## 2020-12-08 NOTE — Progress Notes (Signed)
PROGRESS NOTE  Paul Rubio VOZ:[id number] DOB: [birth year] DOA: 12/06/2020 PCP: Susy Frizzle, MD   LOS: 0 days   Brief narrative:  Paul Rubio is a 70 year old male with past medical history significant for CAD s/p PCI, T2DM, HTN, HLD who was admitted to the hospital on 12/06/2020 under orthopedic service for right reverse shoulder arthroplasty.  While working with PT, patient was noted to be hypoxic with SPO2 down to 77% on room air.  He was placed on 2 L O2 via [place] with improvement.    Denied any shortness of breath but had mild cough with clear phlegm production for a week.  Hospitalist team was consulted for hypoxia.  Assessment/Plan:  Active Problems:   S/P reverse total shoulder arthroplasty, right   Right lower lobe pneumonia  Acute respiratory failure with hypoxia secondary to right lower lobe pneumonia: CT angiogram of the chest was done.  Noted pneumonia.  Possible aspiration.  Also atelectasis.  Continue incentive spirometry.  Advised to use frequently.  Ambulate as able.  Temperature max of 101.3 F this morning..  Currently on 2 L of oxygen by nasal cannula.  Continue to wean oxygen as able. On IV Unasyn.  Could change to oral Augmentin on discharge.  Covid test on 12/02/2020 was negative.  S/p right shoulder arthroplasty 12/06/2020:  management as per orthopedics.  CAD s/p PCI: Continue aspirin and atorvastatin.  No acute issues at this time  Hypertension: Continue Lopressor and losartan.  Blood pressure seems to be stable  Type 2 diabetes mellitus: Continue Metformin with sliding scale insulin.  Continue to monitor blood glucose levels closely.  Hyperlipidemia: Continue atorvastatin.  DVT prophylaxis: SCD's Start: 12/06/20 1211 Place TED hose Start: 12/06/20 1211   Code Status: Full code  Family Communication: Spoke with the patient's spouse at bedside  Status is: Observation  Procedures:  Right shoulder arthroplasty on  12/06/2020  Antibiotics:  . Unasyn 1/5>  Anti-infectives (From admission, onward)   Start     Dose/Rate Route Frequency Ordered Stop   12/07/20 2130  ampicillin-sulbactam (UNASYN) 1.5 g in sodium chloride 0.9 % 100 mL IVPB        1.5 g 200 mL/hr over 30 Minutes Intravenous Every 6 hours 12/07/20 1956     12/06/20 1400  ceFAZolin (ANCEF) IVPB 2g/100 mL premix        2 g 200 mL/hr over 30 Minutes Intravenous Every 8 hours 12/06/20 1211 12/07/20 0636   12/06/20 1015  vancomycin (VANCOCIN) powder  Status:  Discontinued          As needed 12/06/20 1015 12/06/20 1114   12/06/20 0615  ceFAZolin (ANCEF) IVPB 2g/100 mL premix        2 g 200 mL/hr over 30 Minutes Intravenous On call to O.R. 12/06/20 0615 12/06/20 0816     Subjective: Today, patient was seen and examined at bedside.  Patient denies any pain, nausea, vomiting or increasing shortness of breath.  Objective: Vitals:   12/07/20 2012 12/08/20 0737  BP: 123/61 106/70  Pulse: 88 87  Resp: 18 17  Temp: 99.5 F (37.5 C) (!) 101.3 F (38.5 C)  SpO2: 95% 96%    Intake/Output Summary (Last 24 hours) at 12/08/2020 0745 Last data filed at 12/08/2020 0700 Gross per 24 hour  Intake 1300 ml  Output 540 ml  Net 760 ml   Filed Weights   12/06/20 0606  Weight: 96.2 kg   Body mass index is 30.42 kg/m.   Physical Exam:  GENERAL: Patient is alert awake and oriented. Not in obvious distress.  On nasal cannula oxygen HENT: No scleral pallor or icterus. Pupils equally reactive to light. Oral mucosa is moist NECK: is supple, no gross swelling noted. CHEST:.  Diminished breath sounds bilaterally. CVS: S1 and S2 heard, no murmur. Regular rate and rhythm.  ABDOMEN: Soft, non-tender, bowel sounds are present.  Right shoulder surgery status post sling EXTREMITIES: No edema. CNS: Cranial nerves are intact. No focal motor deficits. SKIN: warm and dry without rashes.  Data Review: I have personally reviewed the following laboratory data and  studies,  CBC: Recent Labs  Lab 12/08/20 0155  WBC 11.9*  HGB 9.5*  HCT 29.4*  MCV 90.2  PLT 260   Basic Metabolic Panel: No results for input(s): NA, K, CL, CO2, GLUCOSE, BUN, CREATININE, CALCIUM, MG, PHOS in the last 168 hours. Liver Function Tests: No results for input(s): AST, ALT, ALKPHOS, BILITOT, PROT, ALBUMIN in the last 168 hours. No results for input(s): LIPASE, AMYLASE in the last 168 hours. No results for input(s): AMMONIA in the last 168 hours. Cardiac Enzymes: No results for input(s): CKTOTAL, CKMB, CKMBINDEX, TROPONINI in the last 168 hours. BNP (last 3 results) No results for input(s): BNP in the last 8760 hours.  ProBNP (last 3 results) No results for input(s): PROBNP in the last 8760 hours.  CBG: Recent Labs  Lab 12/06/20 1722 12/07/20 0846 12/07/20 1138 12/07/20 1618 12/08/20 0738  GLUCAP 148* 258* 144* 150* 122*   Recent Results (from the past 240 hour(s))  Urine culture     Status: None   Collection Time: 11/30/20  1:29 PM   Specimen: Urine, Clean Catch  Result Value Ref Range Status   Specimen Description URINE, CLEAN CATCH  Final   Special Requests NONE  Final   Culture   Final    NO GROWTH Performed at [facility], [address] [place], [place] [address]    Report Status 12/01/2020 FINAL  Final  Surgical pcr screen     Status: None   Collection Time: 11/30/20  3:50 PM   Specimen: Nasal Mucosa; Nasal Swab  Result Value Ref Range Status   MRSA, PCR NEGATIVE NEGATIVE Final   Staphylococcus aureus NEGATIVE NEGATIVE Final    Comment: (NOTE) The Xpert SA Assay (FDA approved for NASAL specimens in patients 36 years of age and older), is one component of a comprehensive surveillance program. It is not intended to diagnose infection nor to guide or monitor treatment. Performed at [facility], [address] [place], [place] [address]   SARS CORONAVIRUS 2 (TAT 6-24 HRS) Nasopharyngeal Nasopharyngeal Swab     Status: None    Collection Time: 12/02/20  1:25 PM   Specimen: Nasopharyngeal Swab  Result Value Ref Range Status   SARS Coronavirus 2 NEGATIVE NEGATIVE Final    Comment: (NOTE) SARS-CoV-2 target nucleic acids are NOT DETECTED.  The SARS-CoV-2 RNA is generally detectable in upper and lower respiratory specimens during the acute phase of infection. Negative results do not preclude SARS-CoV-2 infection, do not rule out co-infections with other pathogens, and should not be used as the sole basis for treatment or other patient management decisions. Negative results must be combined with clinical observations, patient history, and epidemiological information. The expected result is Negative.  Fact Sheet for Patients: HairSlick.no  Fact Sheet for Healthcare Providers: quierodirigir.com  This test is not yet approved or cleared by the [place] and  has been authorized  for detection and/or diagnosis of SARS-CoV-2 by FDA under an Emergency Use Authorization (EUA). This EUA will remain  in effect (meaning this test can be used) for the duration of the COVID-19 declaration under Se ction 564(b)(1) of the Act, 21 U.S.C. section 360bbb-3(b)(1), unless the authorization is terminated or revoked sooner.  Performed at [facility], [address] [place], [place] [address]      Studies: DG Chest 2 View  Result Date: 12/07/2020 CLINICAL DATA:  Low oxygen saturation, post RIGHT total shoulder arthroplasty yesterday EXAM: CHEST - 2 VIEW COMPARISON:  10/12/2013 FINDINGS: Normal heart size, mediastinal contours, and pulmonary vascularity. Subsegmental atelectasis at mid to lower RIGHT lung. Remaining lungs clear. No acute infiltrate, pleural effusion, or pneumothorax. RIGHT shoulder prosthesis noted. IMPRESSION: RIGHT basilar atelectasis. Electronically Signed   By: Ulyses Southward M.D.   On: 12/07/2020 09:44   CT ANGIO CHEST PE W OR WO  CONTRAST  Result Date: 12/07/2020 CLINICAL DATA:  Shortness of breath. Right shoulder replacement 1 day prior EXAM: CT ANGIOGRAPHY CHEST WITH CONTRAST TECHNIQUE: Multidetector CT imaging of the chest was performed using the standard protocol during bolus administration of intravenous contrast. Multiplanar CT image reconstructions and MIPs were obtained to evaluate the vascular anatomy. CONTRAST:  89mL OMNIPAQUE IOHEXOL 350 MG/ML SOLN COMPARISON:  Chest radiograph December 07, 2020 FINDINGS: Cardiovascular: There is no demonstrable pulmonary embolus. There is no thoracic aortic aneurysm or dissection. Visualized great vessels appear normal. There is slight aortic atherosclerosis. There are foci of coronary artery calcification at multiple sites. There is no pericardial effusion or pericardial thickening. Mediastinum/Nodes: Visualized thyroid appears normal. No evident thoracic adenopathy. No esophageal lesions are appreciable. Lungs/Pleura: There is airspace consolidation consistent with pneumonia with superimposed atelectasis in the right lower lobe, primarily medially and posteriorly. There is atelectatic change in the left lower lobe. Atelectatic changes also noted in the right middle lobe. No appreciable pleural effusions. Upper Abdomen: Gallbladder is absent. Visualized upper abdominal structures otherwise normal. Musculoskeletal: Total shoulder replacement on the right. There is a sizable joint effusion in the right shoulder area with multiple foci of air within the shoulder joint and tracking into the anterior right hemithorax consistent with recent surgery. Edema of the right pectoral muscles is likely of postoperative etiology. No blastic or lytic bone lesions are evident. No acute appearing fracture evident. Review of the MIP images confirms the above findings. IMPRESSION: 1. No demonstrable pulmonary embolus. No thoracic aortic aneurysm or dissection. Mild aortic atherosclerosis. Foci of coronary artery  calcification noted. 2. Airspace consolidation consistent with pneumonia, likely with superimposed atelectasis in the right lower lobe. Areas of patchy atelectasis in the left lower lobe and right middle lobe also noted. 3.  No adenopathy. 4. Status post total shoulder replacement on the right with moderate fluid and extensive air in the right shoulder as well as tracking into the anterior right hemithorax consistent with recent surgery. Edema of the pectoralis muscles on the right is likely of recent postoperative etiology. 5.  Absent gallbladder. Aortic Atherosclerosis (ICD10-I70.0). Electronically Signed   By: Bretta Bang III M.D.   On: 12/07/2020 17:46   DG Shoulder Right Port  Result Date: 12/06/2020 CLINICAL DATA:  Status post right shoulder replacement. EXAM: PORTABLE RIGHT SHOULDER COMPARISON:  None. FINDINGS: The right humeral and glenoid components appear to be well situated. No fracture or dislocation is noted. IMPRESSION: Status post right total shoulder arthroplasty. Electronically Signed   By: Lupita Raider M.D.   On: 12/06/2020  12:05      Joycelyn Das, MD  [facility] 12/08/2020  If 7PM-7AM, please contact night-coverage

## 2020-12-08 NOTE — Progress Notes (Signed)
SATURATION QUALIFICATIONS: (This note is used to comply with regulatory documentation for home oxygen)  Patient Saturations on 2L O2 [place] at Rest = 97%  Patient Saturations on Room Air at Rest = 94%  Patient Saturations on Room Air while Ambulating = 93-95%  Please briefly explain why patient needs home oxygen: Patient does not require oxygen at home at this time due to improvement in ability to maintain spO2 >90% with mobility and at rest.    Neha Waight A. Dan Humphreys, PT, DPT Acute Rehabilitation Services Pager [phone] Office [phone]

## 2020-12-08 NOTE — TOC Initial Note (Signed)
Transition of Care [facility]) - Initial/Assessment Note    Patient Details  Name: Paul Rubio MRN: [id number] Date of Birth: Nov 01, 1951  [facility] CM/SW Contact:    Mearl Latin, LCSW Phone Number: 12/08/2020, 9:05 AM  Clinical Narrative:                 CSW received consult for possible home health services at time of discharge. CSW spoke with patient's spouse at bedside. She reported that patient would like home health services. Patient reports preference for [facility] since spouse has used them before and liked their care. CSW sent referral for review and was accepted for PT/OT. CSW provided Medicare HH ratings list. CSW discussed equipment needs and he has all needed DME at home ([employer] arranging a tilt chair for him). CSW confirmed PCP and address. Patient's spouse will transport at discharge.   Expected Discharge Plan: Home w Home Health Services Barriers to Discharge: Continued Medical Work up   Patient Goals and CMS Choice Patient states their goals for this hospitalization and ongoing recovery are:: Return home CMS Medicare.gov Compare Post Acute Care list provided to:: Patient Choice offered to / list presented to : [facility]  Expected Discharge Plan and Services Expected Discharge Plan: Home w Home Health Services   Discharge Planning Services: CM Consult Post Acute Care Choice: Home Health Living arrangements for the past 2 months: Single Family Home                           HH Arranged: PT,OT HH Agency: [facility] Date HH Agency Contacted: 12/07/20   Representative spoke with at [facility] Agency: Pearson Grippe  Prior Living Arrangements/Services Living arrangements for the past 2 months: Single Family Home Lives with:: Spouse Patient language and need for interpreter reviewed:: Yes Do you feel safe going back to the place where you live?: Yes      Need for Family Participation in Patient Care: Yes (Comment) Care  giver support system in place?: Yes (comment) Current home services: DME Criminal Activity/Legal Involvement Pertinent to Current Situation/Hospitalization: No - Comment as needed  Activities of Daily Living Home Assistive Devices/Equipment: None ADL Screening (condition at time of admission) Patient's cognitive ability adequate to safely complete daily activities?: Yes Is the patient deaf or have difficulty hearing?: No Does the patient have difficulty seeing, even when wearing glasses/contacts?: No Does the patient have difficulty concentrating, remembering, or making decisions?: No Patient able to express need for assistance with ADLs?: Yes Does the patient have difficulty dressing or bathing?: No Independently performs ADLs?: Yes (appropriate for developmental age) Does the patient have difficulty walking or climbing stairs?: No Weakness of Legs: None Weakness of Arms/Hands: Both  Permission Sought/Granted Permission sought to share information with : Facility Contractor granted to share information with : Yes, Verbal Permission Granted     Permission granted to share info w AGENCY: HH  Permission granted to share info w Relationship: Spouse     Emotional Assessment Appearance:: Appears stated age   Affect (typically observed): Appropriate Orientation: : Oriented to Self,Oriented to Place,Oriented to  Time,Oriented to Situation Alcohol / Substance Use: Not Applicable Psych Involvement: No (comment)  Admission diagnosis:  S/P reverse total shoulder arthroplasty, right [Z96.611] Patient Active Problem List   Diagnosis Date Noted  . Right lower lobe pneumonia 12/07/2020  . S/P reverse total shoulder arthroplasty, right 12/06/2020  . Type 2 diabetes mellitus with  complication, without long-term current use of insulin (HCC)   . Acute sinusitis 02/10/2014  . Acute bronchitis 02/10/2014  . ESSENTIAL HYPERTENSION, BENIGN 07/25/2010  .  Mixed hyperlipidemia 05/31/2010  . CAD, NATIVE VESSEL 05/31/2010  . FATIGUE / MALAISE 05/31/2010  . HYPERTENSION, HX OF 05/31/2010   PCP:  Donita Brooks, MD Pharmacy:   [facility] [phone] - [place], [place] - [address] [facility] [address] [place] [address] Phone: [phone] Fax: [phone]  [facility] ORDER) ELECTRONIC - [place], [place] - [address] [place] [address] Phone: [phone] Fax: [phone]     Social Determinants of Health (SDOH) Interventions    Readmission Risk Interventions No flowsheet data found.

## 2020-12-09 DIAGNOSIS — Z96611 Presence of right artificial shoulder joint: Secondary | ICD-10-CM | POA: Diagnosis not present

## 2020-12-09 DIAGNOSIS — J690 Pneumonitis due to inhalation of food and vomit: Secondary | ICD-10-CM | POA: Diagnosis not present

## 2020-12-09 DIAGNOSIS — J9601 Acute respiratory failure with hypoxia: Secondary | ICD-10-CM | POA: Diagnosis not present

## 2020-12-09 LAB — GLUCOSE, CAPILLARY
Glucose-Capillary: 110 mg/dL — ABNORMAL HIGH (ref 70–99)
Glucose-Capillary: 110 mg/dL — ABNORMAL HIGH (ref 70–99)

## 2020-12-09 MED ORDER — METHOCARBAMOL 500 MG PO TABS: 500.0000 mg | ORAL_TABLET | Freq: Four times a day (QID) | ORAL | 0 refills | Status: DC | PRN

## 2020-12-09 MED ORDER — AMOXICILLIN-POT CLAVULANATE 875-125 MG PO TABS: 1.0000 | ORAL_TABLET | Freq: Two times a day (BID) | ORAL | 0 refills | Status: AC

## 2020-12-09 MED ORDER — GABAPENTIN 300 MG PO CAPS: 300.0000 mg | ORAL_CAPSULE | Freq: Three times a day (TID) | ORAL | 0 refills | Status: DC

## 2020-12-09 MED ORDER — OXYCODONE HCL 5 MG PO TABS: 5.0000 mg | ORAL_TABLET | ORAL | 0 refills | Status: DC | PRN

## 2020-12-09 NOTE — Progress Notes (Signed)
Pt is ambulating to the bathroom with assist. Right arm on a sling, right shoulder dressing dry and intact. Pain is controlled. Discharge instructions given to pt and wife. Discharged to home.

## 2020-12-09 NOTE — Plan of Care (Signed)

## 2020-12-09 NOTE — Progress Notes (Signed)
PROGRESS NOTE  Paul Rubio HKV:[id number] DOB: 04-23-1951 DOA: 12/06/2020 PCP: Susy Frizzle, MD   LOS: 1 day   Brief narrative:  Paul Rubio is a 70 year old male with past medical history significant for CAD s/p PCI, T2DM, HTN, HLD who was admitted to the hospital on 12/06/2020 under orthopedic service for right reverse shoulder arthroplasty.  While working with PT, Paul Rubio was noted to be hypoxic with SPO2 down to 77% on room air.  He was placed on 2 L O2 via [place] with improvement.    Denied any shortness of breath but had mild cough with clear phlegm production for a week.  Hospitalist team was consulted for hypoxia.  Assessment/Plan:  Active Problems:   S/P reverse total shoulder arthroplasty, right   Right lower lobe pneumonia  Acute respiratory failure with hypoxia secondary to right lower lobe pneumonia: CT angiogram of the chest was done.  Noted pneumonia.  Possible aspiration.  Also atelectasis.   Advised incentive spirometry.  Ambulate as able.  Temperature max of 101.3 yesterday.  Clinically feeling better.  On IV Unasyn.  Could change to oral Augmentin on discharge.  Paul Rubio has been weaned off oxygen.  Currently on room air.  Covid test on 12/02/2020 was negative.  S/p right shoulder arthroplasty 12/06/2020:  management as per orthopedics.  CAD s/p PCI: Continue aspirin and atorvastatin.  No acute issues at this time  Hypertension: Continue Lopressor and losartan.  Blood pressure seems to be stable  Type 2 diabetes mellitus: Continue Metformin on discharge.    Hyperlipidemia: Continue atorvastatin.  Disposition.  At this time Paul Rubio appears to be clinically stable without need for oxygen supplementation..  Could discharge home with oral Augmentin to complete 5-day course.  Encourage incentive spirometry on discharge. follow-up with his primary care physician after discharge.  DVT prophylaxis: SCD's Start: 12/06/20 1211 Place TED hose Start: 12/06/20  1211   Code Status: Full code  Family Communication: Spoke with the Paul Rubio's spouse at bedside  Status is: Observation  Procedures:  Right shoulder arthroplasty on 12/06/2020  Antibiotics:  . Unasyn 1/5>  Anti-infectives (From admission, onward)   Start     Dose/Rate Route Frequency Ordered Stop   12/09/20 0000  amoxicillin-clavulanate (AUGMENTIN) 875-125 MG tablet        1 tablet Oral 2 times daily 12/09/20 0840 12/23/20 2359   12/07/20 2130  ampicillin-sulbactam (UNASYN) 1.5 g in sodium chloride 0.9 % 100 mL IVPB        1.5 g 200 mL/hr over 30 Minutes Intravenous Every 6 hours 12/07/20 1956     12/06/20 1400  ceFAZolin (ANCEF) IVPB 2g/100 mL premix        2 g 200 mL/hr over 30 Minutes Intravenous Every 8 hours 12/06/20 1211 12/07/20 0636   12/06/20 1015  vancomycin (VANCOCIN) powder  Status:  Discontinued          As needed 12/06/20 1015 12/06/20 1114   12/06/20 0615  ceFAZolin (ANCEF) IVPB 2g/100 mL premix        2 g 200 mL/hr over 30 Minutes Intravenous On call to O.R. 12/06/20 0615 12/06/20 0816     Subjective: Paul Rubio, Paul Rubio was seen and examined at bedside.  Feels much better.  Denies any dyspnea cough chest pain palpitation.    Objective: Vitals:   12/09/20 0400 12/09/20 0809  BP: (!) 152/74 131/67  Pulse: 89 86  Resp: 15 17  Temp: 99.4 F (37.4 C) 100.2 F (37.9 C)  SpO2: 96% 91%  Intake/Output Summary (Last 24 hours) at 12/09/2020 1020 Last data filed at 12/09/2020 0830 Gross per 24 hour  Intake 720 ml  Output 300 ml  Net 420 ml   Filed Weights   12/06/20 0606  Weight: 96.2 kg   Body mass index is 30.42 kg/m.   Physical Exam:  GENERAL: Paul Rubio is alert awake and oriented. Not in obvious distress.  On room air.  HENT: No scleral pallor or icterus. Pupils equally reactive to light. Oral mucosa is moist NECK: is supple, no gross swelling noted. CHEST:.  Diminished breath sounds bilaterally.  No wheezes noted. CVS: S1 and S2 heard, no murmur.  Regular rate and rhythm.  ABDOMEN: Soft, non-tender, bowel sounds are present.  Right shoulder surgery status post sling  EXTREMITIES: No edema. CNS: Cranial nerves are intact. No focal motor deficits. SKIN: warm and dry without rashes.  Data Review: I have personally reviewed the following laboratory data and studies,  CBC: Recent Labs  Lab 12/08/20 0155  WBC 11.9*  HGB 9.5*  HCT 29.4*  MCV 90.2  PLT 260   Basic Metabolic Panel: No results for input(s): NA, K, CL, CO2, GLUCOSE, BUN, CREATININE, CALCIUM, MG, PHOS in the last 168 hours. Liver Function Tests: No results for input(s): AST, ALT, ALKPHOS, BILITOT, PROT, ALBUMIN in the last 168 hours. No results for input(s): LIPASE, AMYLASE in the last 168 hours. No results for input(s): AMMONIA in the last 168 hours. Cardiac Enzymes: No results for input(s): CKTOTAL, CKMB, CKMBINDEX, TROPONINI in the last 168 hours. BNP (last 3 results) No results for input(s): BNP in the last 8760 hours.  ProBNP (last 3 results) No results for input(s): PROBNP in the last 8760 hours.  CBG: Recent Labs  Lab 12/08/20 1205 12/08/20 1646 12/08/20 1959 12/09/20 0436 12/09/20 0813  GLUCAP 118* 134* 140* 110* 110*   Recent Results (from the past 240 hour(s))  Urine culture     Status: None   Collection Time: 11/30/20  1:29 PM   Specimen: Urine, Clean Catch  Result Value Ref Range Status   Specimen Description URINE, CLEAN CATCH  Final   Special Requests NONE  Final   Culture   Final    NO GROWTH Performed at [facility], [address], [place], [place] [address]    Report Status 12/01/2020 FINAL  Final  Surgical pcr screen     Status: None   Collection Time: 11/30/20  3:50 PM   Specimen: Nasal Mucosa; Nasal Swab  Result Value Ref Range Status   MRSA, PCR NEGATIVE NEGATIVE Final   Staphylococcus aureus NEGATIVE NEGATIVE Final    Comment: (NOTE) The Xpert SA Assay (FDA approved for NASAL specimens in patients 52 years of age  and older), is one component of a comprehensive surveillance program. It is not intended to diagnose infection nor to guide or monitor treatment. Performed at [facility], [address] [place], [place] [address]   SARS CORONAVIRUS 2 (TAT 6-24 HRS) Nasopharyngeal Nasopharyngeal Swab     Status: None   Collection Time: 12/02/20  1:25 PM   Specimen: Nasopharyngeal Swab  Result Value Ref Range Status   SARS Coronavirus 2 NEGATIVE NEGATIVE Final    Comment: (NOTE) SARS-CoV-2 target nucleic acids are NOT DETECTED.  The SARS-CoV-2 RNA is generally detectable in upper and lower respiratory specimens during the acute phase of infection. Negative results do not preclude SARS-CoV-2 infection, do not rule out co-infections with other pathogens, and should not be used as  the sole basis for treatment or other Paul Rubio management decisions. Negative results must be combined with clinical observations, Paul Rubio history, and epidemiological information. The expected result is Negative.  Fact Sheet for Patients: HairSlick.no  Fact Sheet for Healthcare Providers: quierodirigir.com  This test is not yet approved or cleared by the [place] FDA and  has been authorized for detection and/or diagnosis of SARS-CoV-2 by FDA under an Emergency Use Authorization (EUA). This EUA will remain  in effect (meaning this test can be used) for the duration of the COVID-19 declaration under Se ction 564(b)(1) of the Act, 21 U.S.C. section 360bbb-3(b)(1), unless the authorization is terminated or revoked sooner.  Performed at [facility], [address], [place], [place] [address]      Studies: CT ANGIO CHEST PE W OR WO CONTRAST  Result Date: 12/07/2020 CLINICAL DATA:  Shortness of breath. Right shoulder replacement 1 day prior EXAM: CT ANGIOGRAPHY CHEST WITH CONTRAST TECHNIQUE: Multidetector CT imaging of the chest was performed using  the standard protocol during bolus administration of intravenous contrast. Multiplanar CT image reconstructions and MIPs were obtained to evaluate the vascular anatomy. CONTRAST:  85mL OMNIPAQUE IOHEXOL 350 MG/ML SOLN COMPARISON:  Chest radiograph December 07, 2020 FINDINGS: Cardiovascular: There is no demonstrable pulmonary embolus. There is no thoracic aortic aneurysm or dissection. Visualized great vessels appear normal. There is slight aortic atherosclerosis. There are foci of coronary artery calcification at multiple sites. There is no pericardial effusion or pericardial thickening. Mediastinum/Nodes: Visualized thyroid appears normal. No evident thoracic adenopathy. No esophageal lesions are appreciable. Lungs/Pleura: There is airspace consolidation consistent with pneumonia with superimposed atelectasis in the right lower lobe, primarily medially and posteriorly. There is atelectatic change in the left lower lobe. Atelectatic changes also noted in the right middle lobe. No appreciable pleural effusions. Upper Abdomen: Gallbladder is absent. Visualized upper abdominal structures otherwise normal. Musculoskeletal: Total shoulder replacement on the right. There is a sizable joint effusion in the right shoulder area with multiple foci of air within the shoulder joint and tracking into the anterior right hemithorax consistent with recent surgery. Edema of the right pectoral muscles is likely of postoperative etiology. No blastic or lytic bone lesions are evident. No acute appearing fracture evident. Review of the MIP images confirms the above findings. IMPRESSION: 1. No demonstrable pulmonary embolus. No thoracic aortic aneurysm or dissection. Mild aortic atherosclerosis. Foci of coronary artery calcification noted. 2. Airspace consolidation consistent with pneumonia, likely with superimposed atelectasis in the right lower lobe. Areas of patchy atelectasis in the left lower lobe and right middle lobe also noted. 3.   No adenopathy. 4. Status post total shoulder replacement on the right with moderate fluid and extensive air in the right shoulder as well as tracking into the anterior right hemithorax consistent with recent surgery. Edema of the pectoralis muscles on the right is likely of recent postoperative etiology. 5.  Absent gallbladder. Aortic Atherosclerosis (ICD10-I70.0). Electronically Signed   By: Bretta Bang III M.D.   On: 12/07/2020 17:46      Joycelyn Das, MD  [facility] 12/09/2020  If 7PM-7AM, please contact night-coverage

## 2020-12-09 NOTE — TOC Transition Note (Signed)
Transition of Care [facility]) - CM/SW Discharge Note   Patient Details  Name: Paul Rubio MRN: [id number] Date of Birth: [birth year]  [facility] CM/SW Contact:  Mearl Latin, LCSW Phone Number: 12/09/2020, 8:58 AM   Clinical Narrative:    CSW made [facility] aware of patient's discharge today. Orders in. No other needs identified.    Final next level of care: Home w Home Health Services Barriers to Discharge: No Barriers Identified   Patient Goals and CMS Choice Patient states their goals for this hospitalization and ongoing recovery are:: Return home CMS Medicare.gov Compare Post Acute Care list provided to:: Patient Choice offered to / list presented to : [facility]  Discharge Placement                Patient to be transferred to facility by: Car Name of family member notified: Sposue Patient and family notified of of transfer: 12/09/20  Discharge Plan and Services   Discharge Planning Services: CM Consult Post Acute Care Choice: Home Health                    HH Arranged: PT,OT HH Agency: [facility] Date [facility] Agency Contacted: 12/07/20   Representative spoke with at [facility] Agency: Pearson Grippe  Social Determinants of Health (SDOH) Interventions     Readmission Risk Interventions No flowsheet data found.

## 2020-12-09 NOTE — Progress Notes (Signed)
Patient stable Progressing well. O2 sats above 90% on room air with ambulation Plan discharge to home today.  Received motion brace this morning Plan on 10 days of oral antibiotics per medical recommendation

## 2020-12-10 DIAGNOSIS — Z7982 Long term (current) use of aspirin: Secondary | ICD-10-CM | POA: Diagnosis not present

## 2020-12-10 DIAGNOSIS — E119 Type 2 diabetes mellitus without complications: Secondary | ICD-10-CM | POA: Diagnosis not present

## 2020-12-10 DIAGNOSIS — I252 Old myocardial infarction: Secondary | ICD-10-CM | POA: Diagnosis not present

## 2020-12-10 DIAGNOSIS — J181 Lobar pneumonia, unspecified organism: Secondary | ICD-10-CM | POA: Diagnosis not present

## 2020-12-10 DIAGNOSIS — Z7984 Long term (current) use of oral hypoglycemic drugs: Secondary | ICD-10-CM | POA: Diagnosis not present

## 2020-12-10 DIAGNOSIS — J9601 Acute respiratory failure with hypoxia: Secondary | ICD-10-CM | POA: Diagnosis not present

## 2020-12-10 DIAGNOSIS — Z9861 Coronary angioplasty status: Secondary | ICD-10-CM | POA: Diagnosis not present

## 2020-12-10 DIAGNOSIS — Z79891 Long term (current) use of opiate analgesic: Secondary | ICD-10-CM | POA: Diagnosis not present

## 2020-12-10 DIAGNOSIS — Z96611 Presence of right artificial shoulder joint: Secondary | ICD-10-CM | POA: Diagnosis not present

## 2020-12-10 DIAGNOSIS — Z87891 Personal history of nicotine dependence: Secondary | ICD-10-CM | POA: Diagnosis not present

## 2020-12-10 DIAGNOSIS — I10 Essential (primary) hypertension: Secondary | ICD-10-CM | POA: Diagnosis not present

## 2020-12-10 DIAGNOSIS — E785 Hyperlipidemia, unspecified: Secondary | ICD-10-CM | POA: Diagnosis not present

## 2020-12-10 DIAGNOSIS — I2510 Atherosclerotic heart disease of native coronary artery without angina pectoris: Secondary | ICD-10-CM | POA: Diagnosis not present

## 2020-12-10 DIAGNOSIS — Z471 Aftercare following joint replacement surgery: Secondary | ICD-10-CM | POA: Diagnosis not present

## 2020-12-10 DIAGNOSIS — Z791 Long term (current) use of non-steroidal anti-inflammatories (NSAID): Secondary | ICD-10-CM | POA: Diagnosis not present

## 2020-12-10 NOTE — Discharge Summary (Signed)
Physician Discharge Summary      Patient ID: Paul Rubio MRN: [id number] DOB/AGE: [birth year] 70 y.o.  Admit date: 12/06/2020 Discharge date: 12/10/2020  Admission Diagnoses:  Active Problems:   S/P reverse total shoulder arthroplasty, right   Right lower lobe pneumonia   Discharge Diagnoses:  Same  Surgeries: Procedure(s): RIGHT REVERSE SHOULDER ARTHROPLASTY on 12/06/2020   Consultants:   Discharged Condition: Stable  Hospital Course: SLADE PIERPOINT is an 70 y.o. male who was admitted 12/06/2020 with a chief complaint of right shoulder pain, and found to have a diagnosis of right rotator cuff arthropathy of the shoulder.  They were brought to the operating room on 12/06/2020 and underwent the above named procedures.  He tolerated procedure well and was mobilized with occupational therapy on postop day #1.  He was noted to have unexplained low O2 saturation which required supplemental oxygen.  Subsequent work-up demonstrated no pulmonary embolism but there was evidence of possible aspiration pneumonia.  Patient was started on IV antibiotics and changed over to p.o. antibiotics.  By the time of discharge he was satting well over 90% on room air and felt good.  He is discharged home in good condition and will follow-up with [place] in 2 weeks.  He is discharged home on a 10-day course of oral antibiotics along with pain medicine and muscle relaxers.  Antibiotics given:  Anti-infectives (From admission, onward)   Start     Dose/Rate Route Frequency Ordered Stop   12/09/20 0000  amoxicillin-clavulanate (AUGMENTIN) 875-125 MG tablet        1 tablet Oral 2 times daily 12/09/20 0840 12/23/20 2359   12/07/20 2130  ampicillin-sulbactam (UNASYN) 1.5 g in sodium chloride 0.9 % 100 mL IVPB  Status:  Discontinued        1.5 g 200 mL/hr over 30 Minutes Intravenous Every 6 hours 12/07/20 1956 12/09/20 1743   12/06/20 1400  ceFAZolin (ANCEF) IVPB 2g/100 mL premix        2 g 200 mL/hr over 30 Minutes  Intravenous Every 8 hours 12/06/20 1211 12/07/20 0636   12/06/20 1015  vancomycin (VANCOCIN) powder  Status:  Discontinued          As needed 12/06/20 1015 12/06/20 1114   12/06/20 0615  ceFAZolin (ANCEF) IVPB 2g/100 mL premix        2 g 200 mL/hr over 30 Minutes Intravenous On call to O.R. 12/06/20 4742 12/06/20 5956    .  Recent vital signs:  Vitals:   12/09/20 0809 12/09/20 1032  BP: 131/67   Pulse: 86   Resp: 17   Temp: 100.2 F (37.9 C) 98.6 F (37 C)  SpO2: 91%     Recent laboratory studies:  Results for orders placed or performed during the hospital encounter of 12/06/20  Glucose, capillary  Result Value Ref Range   Glucose-Capillary 141 (H) 70 - 99 mg/dL  Glucose, capillary  Result Value Ref Range   Glucose-Capillary 157 (H) 70 - 99 mg/dL  Glucose, capillary  Result Value Ref Range   Glucose-Capillary 148 (H) 70 - 99 mg/dL  Glucose, capillary  Result Value Ref Range   Glucose-Capillary 258 (H) 70 - 99 mg/dL  Glucose, capillary  Result Value Ref Range   Glucose-Capillary 144 (H) 70 - 99 mg/dL  Glucose, capillary  Result Value Ref Range   Glucose-Capillary 150 (H) 70 - 99 mg/dL  CBC  Result Value Ref Range   WBC 11.9 (H) 4.0 - 10.5 K/uL   RBC 3.26 (  L) 4.22 - 5.81 MIL/uL   Hemoglobin 9.5 (L) 13.0 - 17.0 g/dL   HCT 69.629.4 (L) 29.539.0 - 28.452.0 %   MCV 90.2 80.0 - 100.0 fL   MCH 29.1 26.0 - 34.0 pg   MCHC 32.3 30.0 - 36.0 g/dL   RDW 13.213.1 44.011.5 - 10.215.5 %   Platelets 260 150 - 400 K/uL   nRBC 0.0 0.0 - 0.2 %  Glucose, capillary  Result Value Ref Range   Glucose-Capillary 122 (H) 70 - 99 mg/dL  Glucose, capillary  Result Value Ref Range   Glucose-Capillary 118 (H) 70 - 99 mg/dL  Glucose, capillary  Result Value Ref Range   Glucose-Capillary 134 (H) 70 - 99 mg/dL  Glucose, capillary  Result Value Ref Range   Glucose-Capillary 140 (H) 70 - 99 mg/dL  Glucose, capillary  Result Value Ref Range   Glucose-Capillary 110 (H) 70 - 99 mg/dL  Glucose, capillary  Result  Value Ref Range   Glucose-Capillary 110 (H) 70 - 99 mg/dL    Discharge Medications:   Allergies as of 12/09/2020   No Known Allergies     Medication List    STOP taking these medications   fluticasone 50 MCG/ACT nasal spray Commonly known as: FLONASE     TAKE these medications   amoxicillin-clavulanate 875-125 MG tablet Commonly known as: Augmentin Take 1 tablet by mouth 2 (two) times daily for 14 days.   APPLE CIDER VINEGAR PO Take 1,200 mg by mouth daily.   ascorbic acid 500 MG tablet Commonly known as: VITAMIN C Take 500 mg by mouth daily.   aspirin EC 81 MG tablet Take 81 mg by mouth daily.   atorvastatin 40 MG tablet Commonly known as: LIPITOR TAKE 1 TABLET(40 MG) BY MOUTH DAILY AT 6 PM What changed: See the new instructions.   cetirizine 10 MG tablet Commonly known as: ZYRTEC Take 10 mg by mouth at bedtime.   cholecalciferol 25 MCG (1000 UNIT) tablet Commonly known as: VITAMIN D Take 1,000 Units by mouth in the morning and at bedtime.   gabapentin 300 MG capsule Commonly known as: NEURONTIN Take 1 capsule (300 mg total) by mouth 3 (three) times daily.   ibuprofen 200 MG tablet Commonly known as: ADVIL Take 400 mg by mouth every 8 (eight) hours as needed (pain.).   losartan 50 MG tablet Commonly known as: COZAAR TAKE 1 TABLET(50 MG) BY MOUTH DAILY What changed: See the new instructions.   metFORMIN 850 MG tablet Commonly known as: GLUCOPHAGE TAKE 1 TABLET BY MOUTH TWICE DAILY THEREAFTER What changed: See the new instructions.   methocarbamol 500 MG tablet Commonly known as: ROBAXIN Take 1 tablet (500 mg total) by mouth every 6 (six) hours as needed for muscle spasms.   metoprolol tartrate 25 MG tablet Commonly known as: LOPRESSOR TAKE 1/2 TABLET(12.5 MG) BY MOUTH TWICE DAILY What changed: See the new instructions.   multivitamin with minerals Tabs tablet Take 1 tablet by mouth daily.   nitroGLYCERIN 0.4 MG SL tablet Commonly known as:  NITROSTAT Place 1 tablet (0.4 mg total) under the tongue every 5 (five) minutes as needed for chest pain.   oxyCODONE 5 MG immediate release tablet Commonly known as: Oxy IR/ROXICODONE Take 1-2 tablets (5-10 mg total) by mouth every 4 (four) hours as needed for moderate pain (pain score 4-6).   Zinc 50 MG Tabs Take 50 mg by mouth daily.       Diagnostic Studies: DG Chest 2 View  Result Date: 12/07/2020  CLINICAL DATA:  Low oxygen saturation, post RIGHT total shoulder arthroplasty yesterday EXAM: CHEST - 2 VIEW COMPARISON:  10/12/2013 FINDINGS: Normal heart size, mediastinal contours, and pulmonary vascularity. Subsegmental atelectasis at mid to lower RIGHT lung. Remaining lungs clear. No acute infiltrate, pleural effusion, or pneumothorax. RIGHT shoulder prosthesis noted. IMPRESSION: RIGHT basilar atelectasis. Electronically Signed   By: Ulyses Southward M.D.   On: 12/07/2020 09:44   CT ANGIO CHEST PE W OR WO CONTRAST  Result Date: 12/07/2020 CLINICAL DATA:  Shortness of breath. Right shoulder replacement 1 day prior EXAM: CT ANGIOGRAPHY CHEST WITH CONTRAST TECHNIQUE: Multidetector CT imaging of the chest was performed using the standard protocol during bolus administration of intravenous contrast. Multiplanar CT image reconstructions and MIPs were obtained to evaluate the vascular anatomy. CONTRAST:  44mL OMNIPAQUE IOHEXOL 350 MG/ML SOLN COMPARISON:  Chest radiograph December 07, 2020 FINDINGS: Cardiovascular: There is no demonstrable pulmonary embolus. There is no thoracic aortic aneurysm or dissection. Visualized great vessels appear normal. There is slight aortic atherosclerosis. There are foci of coronary artery calcification at multiple sites. There is no pericardial effusion or pericardial thickening. Mediastinum/Nodes: Visualized thyroid appears normal. No evident thoracic adenopathy. No esophageal lesions are appreciable. Lungs/Pleura: There is airspace consolidation consistent with pneumonia with  superimposed atelectasis in the right lower lobe, primarily medially and posteriorly. There is atelectatic change in the left lower lobe. Atelectatic changes also noted in the right middle lobe. No appreciable pleural effusions. Upper Abdomen: Gallbladder is absent. Visualized upper abdominal structures otherwise normal. Musculoskeletal: Total shoulder replacement on the right. There is a sizable joint effusion in the right shoulder area with multiple foci of air within the shoulder joint and tracking into the anterior right hemithorax consistent with recent surgery. Edema of the right pectoral muscles is likely of postoperative etiology. No blastic or lytic bone lesions are evident. No acute appearing fracture evident. Review of the MIP images confirms the above findings. IMPRESSION: 1. No demonstrable pulmonary embolus. No thoracic aortic aneurysm or dissection. Mild aortic atherosclerosis. Foci of coronary artery calcification noted. 2. Airspace consolidation consistent with pneumonia, likely with superimposed atelectasis in the right lower lobe. Areas of patchy atelectasis in the left lower lobe and right middle lobe also noted. 3.  No adenopathy. 4. Status post total shoulder replacement on the right with moderate fluid and extensive air in the right shoulder as well as tracking into the anterior right hemithorax consistent with recent surgery. Edema of the pectoralis muscles on the right is likely of recent postoperative etiology. 5.  Absent gallbladder. Aortic Atherosclerosis (ICD10-I70.0). Electronically Signed   By: Bretta Bang III M.D.   On: 12/07/2020 17:46   DG Shoulder Right Port  Result Date: 12/06/2020 CLINICAL DATA:  Status post right shoulder replacement. EXAM: PORTABLE RIGHT SHOULDER COMPARISON:  None. FINDINGS: The right humeral and glenoid components appear to be well situated. No fracture or dislocation is noted. IMPRESSION: Status post right total shoulder arthroplasty. Electronically  Signed   By: Lupita Raider M.D.   On: 12/06/2020 12:05    Disposition: Discharge disposition: 01-Home or Self Care       Discharge Instructions    Call MD / Call 911   Complete by: As directed    If you experience chest pain or shortness of breath, CALL 911 and be transported to the hospital emergency room.  If you develope a fever above 101 F, pus (white drainage) or increased drainage or redness at the wound, or calf pain, call your surgeon's  office.   Constipation Prevention   Complete by: As directed    Drink plenty of fluids.  Prune juice may be helpful.  You may use a stool softener, such as Colace (over the counter) 100 mg twice a day.  Use MiraLax (over the counter) for constipation as needed.   Diet - low sodium heart healthy   Complete by: As directed    Discharge instructions   Complete by: As directed    Use the black brace 1 hour 3 times a day for shoulder range of motion No lifting with right arm Okay to shower dressing is waterproof Return to clinic in 2 weeks for clinical recheck Increase brace degrees daily   Increase activity slowly as tolerated   Complete by: As directed        Follow-up Information    [facility] Follow up.   Specialty: Home Health Services Why: [phone] Home Health PT/OT services arranged. They will contact you about 48 hours after discharge.                Signed: Burnard Bunting 12/10/2020, 3:00 PM

## 2020-12-12 ENCOUNTER — Telehealth: Payer: Self-pay | Admitting: Orthopedic Surgery

## 2020-12-12 DIAGNOSIS — Z96611 Presence of right artificial shoulder joint: Secondary | ICD-10-CM | POA: Diagnosis not present

## 2020-12-12 NOTE — Telephone Encounter (Signed)
Debbie from [facility] called. She would like verbal orders for PT 1wk 1x, 2wk 2x. Her call back number is [phone]

## 2020-12-12 NOTE — Telephone Encounter (Signed)
IC verbal given.  

## 2020-12-13 ENCOUNTER — Other Ambulatory Visit: Payer: Self-pay | Admitting: Family Medicine

## 2020-12-21 ENCOUNTER — Ambulatory Visit (INDEPENDENT_AMBULATORY_CARE_PROVIDER_SITE_OTHER): Payer: Medicare Other

## 2020-12-21 ENCOUNTER — Ambulatory Visit (INDEPENDENT_AMBULATORY_CARE_PROVIDER_SITE_OTHER): Payer: Medicare Other | Admitting: Orthopedic Surgery

## 2020-12-21 DIAGNOSIS — Z96611 Presence of right artificial shoulder joint: Secondary | ICD-10-CM | POA: Diagnosis not present

## 2020-12-24 ENCOUNTER — Encounter: Payer: Self-pay | Admitting: Orthopedic Surgery

## 2020-12-24 NOTE — Progress Notes (Signed)
Post-Op Visit Note   Patient: Paul Rubio           Date of Birth: 1951-06-13           MRN: [id number] Visit Date: 12/21/2020 PCP: Donita Brooks, MD   Assessment & Plan:  Chief Complaint:  Chief Complaint  Patient presents with  . Right Shoulder - Routine Post Op   Visit Diagnoses:  1. History of arthroplasty of right shoulder     Plan: [name] is a 70 year old patient who is now about 2 weeks out reverse shoulder replacement.  On exam deltoid fires.  Passive range of motion is good.  CPM set at 73 degrees.  Radiographs look good.  We will transition him to outpatient physical therapy for passive range of motion exercises.  Discontinue sling.  4-week return for clinical recheck.  Follow-Up Instructions: No follow-ups on file.   Orders:  Orders Placed This Encounter  Procedures  . XR Shoulder Right   No orders of the defined types were placed in this encounter.   Imaging: No results found.  PMFS History: Patient Active Problem List   Diagnosis Date Noted  . Right lower lobe pneumonia 12/07/2020  . S/P reverse total shoulder arthroplasty, right 12/06/2020  . Type 2 diabetes mellitus with complication, without long-term current use of insulin (HCC)   . Acute sinusitis 02/10/2014  . Acute bronchitis 02/10/2014  . ESSENTIAL HYPERTENSION, BENIGN 07/25/2010  . Mixed hyperlipidemia 05/31/2010  . CAD, NATIVE VESSEL 05/31/2010  . FATIGUE / MALAISE 05/31/2010  . HYPERTENSION, HX OF 05/31/2010   Past Medical History:  Diagnosis Date  . Coronary artery disease 2011    2 stents placed  . H/O hiatal hernia   . Hyperlipidemia    borderline; status post stress test x2, the last one 5-6 years ago and without significant abnormality per the patient  . Hypertension    for approximately 5 years  . Myocardial infarction (HCC) 2011  . Pre-diabetes   . Prediabetes     Family History  Problem Relation Age of Onset  . Heart disease Father   . Heart attack Other   .  Sudden death Paternal Uncle        possible MI  . Colon cancer Neg Hx     Past Surgical History:  Procedure Laterality Date  . CHOLECYSTECTOMY    . COLONOSCOPY    . CORONARY ANGIOPLASTY     DES to mid and distal RCA 05/15/10  . REVERSE SHOULDER ARTHROPLASTY Right 12/06/2020   Procedure: RIGHT REVERSE SHOULDER ARTHROPLASTY;  Surgeon: Cammy Copa, MD;  Location: [facility] OR;  Service: Orthopedics;  Laterality: Right;  . Right shoulder rotator cuff arthropathy.  12/06/2020  . ROTATOR CUFF REPAIR Left 10/20/2013   Dr August Saucer  . SHOULDER ARTHROSCOPY WITH OPEN ROTATOR CUFF REPAIR Left 10/20/2013   Procedure: SHOULDER ARTHROSCOPY WITH OPEN ROTATOR CUFF REPAIR;  Surgeon: Cammy Copa, MD;  Location: [facility] OR;  Service: Orthopedics;  Laterality: Left;  Left shoulder arthroscopy, debridement, open biceps tenodesis and rotator cuff repair.   Social History   Occupational History  . Not on file  Tobacco Use  . Smoking status: Former Smoker    Types: Cigarettes    Quit date: 12/03/1968    Years since quitting: 52.0  . Smokeless tobacco: Never Used  Vaping Use  . Vaping Use: Never used  Substance and Sexual Activity  . Alcohol use: No  . Drug use: No  . Sexual activity: Not on  file

## 2020-12-26 DIAGNOSIS — M25511 Pain in right shoulder: Secondary | ICD-10-CM | POA: Diagnosis not present

## 2020-12-28 DIAGNOSIS — M25511 Pain in right shoulder: Secondary | ICD-10-CM | POA: Diagnosis not present

## 2021-01-04 DIAGNOSIS — M25511 Pain in right shoulder: Secondary | ICD-10-CM | POA: Diagnosis not present

## 2021-01-06 ENCOUNTER — Other Ambulatory Visit: Payer: Medicare Other

## 2021-01-06 ENCOUNTER — Other Ambulatory Visit: Payer: Self-pay

## 2021-01-06 DIAGNOSIS — M25511 Pain in right shoulder: Secondary | ICD-10-CM | POA: Diagnosis not present

## 2021-01-06 DIAGNOSIS — E7800 Pure hypercholesterolemia, unspecified: Secondary | ICD-10-CM

## 2021-01-06 DIAGNOSIS — Z125 Encounter for screening for malignant neoplasm of prostate: Secondary | ICD-10-CM | POA: Diagnosis not present

## 2021-01-06 DIAGNOSIS — E119 Type 2 diabetes mellitus without complications: Secondary | ICD-10-CM | POA: Diagnosis not present

## 2021-01-06 DIAGNOSIS — I10 Essential (primary) hypertension: Secondary | ICD-10-CM

## 2021-01-07 LAB — CBC WITH DIFFERENTIAL/PLATELET
Absolute Monocytes: 728 cells/uL (ref 200–950)
Basophils Absolute: 32 cells/uL (ref 0–200)
Basophils Relative: 0.4 %
Eosinophils Absolute: 152 cells/uL (ref 15–500)
Eosinophils Relative: 1.9 %
HCT: 36.9 % — ABNORMAL LOW (ref 38.5–50.0)
Hemoglobin: 11.7 g/dL — ABNORMAL LOW (ref 13.2–17.1)
Lymphs Abs: 2584 cells/uL (ref 850–3900)
MCH: 27.3 pg (ref 27.0–33.0)
MCHC: 31.7 g/dL — ABNORMAL LOW (ref 32.0–36.0)
MCV: 86.2 fL (ref 80.0–100.0)
MPV: 11.3 fL (ref 7.5–12.5)
Monocytes Relative: 9.1 %
Neutro Abs: 4504 cells/uL (ref 1500–7800)
Neutrophils Relative %: 56.3 %
Platelets: 228 10*3/uL (ref 140–400)
RBC: 4.28 10*6/uL (ref 4.20–5.80)
RDW: 12.7 % (ref 11.0–15.0)
Total Lymphocyte: 32.3 %
WBC: 8 10*3/uL (ref 3.8–10.8)

## 2021-01-07 LAB — COMPLETE METABOLIC PANEL WITH GFR
AG Ratio: 1.4 (calc) (ref 1.0–2.5)
ALT: 16 U/L (ref 9–46)
AST: 16 U/L (ref 10–35)
Albumin: 3.8 g/dL (ref 3.6–5.1)
Alkaline phosphatase (APISO): 81 U/L (ref 35–144)
BUN: 24 mg/dL (ref 7–25)
CO2: 31 mmol/L (ref 20–32)
Calcium: 9.3 mg/dL (ref 8.6–10.3)
Chloride: 103 mmol/L (ref 98–110)
Creat: 0.98 mg/dL (ref 0.70–1.18)
GFR, Est African American: 90 mL/min/{1.73_m2} (ref >=60)
GFR, Est Non African American: 77 mL/min/{1.73_m2} (ref >=60)
Globulin: 2.8 g/dL (calc) (ref 1.9–3.7)
Glucose, Bld: 116 mg/dL — ABNORMAL HIGH (ref 65–99)
Potassium: 4.5 mmol/L (ref 3.5–5.3)
Sodium: 142 mmol/L (ref 135–146)
Total Bilirubin: 0.3 mg/dL (ref 0.2–1.2)
Total Protein: 6.6 g/dL (ref 6.1–8.1)

## 2021-01-07 LAB — HEMOGLOBIN A1C
Hgb A1c MFr Bld: 6.5 % of total Hgb — ABNORMAL HIGH (ref <5.7)
Mean Plasma Glucose: 140 mg/dL
eAG (mmol/L): 7.7 mmol/L

## 2021-01-07 LAB — LIPID PANEL
Cholesterol: 109 mg/dL (ref <200)
HDL: 44 mg/dL (ref >=40)
LDL Cholesterol (Calc): 48 mg/dL (calc)
Non-HDL Cholesterol (Calc): 65 mg/dL (calc) (ref <130)
Total CHOL/HDL Ratio: 2.5 (calc) (ref <5.0)
Triglycerides: 83 mg/dL (ref <150)

## 2021-01-07 LAB — PSA: PSA: 1.54 ng/mL (ref <=4.0)

## 2021-01-07 LAB — MICROALBUMIN / CREATININE URINE RATIO
Creatinine, Urine: 248 mg/dL (ref 20–320)
Microalb Creat Ratio: 6 mcg/mg creat (ref <30)
Microalb, Ur: 1.6 mg/dL

## 2021-01-09 DIAGNOSIS — D1801 Hemangioma of skin and subcutaneous tissue: Secondary | ICD-10-CM | POA: Diagnosis not present

## 2021-01-09 DIAGNOSIS — L821 Other seborrheic keratosis: Secondary | ICD-10-CM | POA: Diagnosis not present

## 2021-01-09 DIAGNOSIS — Z85828 Personal history of other malignant neoplasm of skin: Secondary | ICD-10-CM | POA: Diagnosis not present

## 2021-01-09 DIAGNOSIS — D225 Melanocytic nevi of trunk: Secondary | ICD-10-CM | POA: Diagnosis not present

## 2021-01-10 DIAGNOSIS — M25511 Pain in right shoulder: Secondary | ICD-10-CM | POA: Diagnosis not present

## 2021-01-12 ENCOUNTER — Ambulatory Visit (INDEPENDENT_AMBULATORY_CARE_PROVIDER_SITE_OTHER): Payer: Medicare Other | Admitting: Family Medicine

## 2021-01-12 ENCOUNTER — Other Ambulatory Visit: Payer: Self-pay

## 2021-01-12 VITALS — BP 128/82 | HR 84 | Temp 97.9°F | Resp 14 | Ht 70.0 in | Wt 211.0 lb

## 2021-01-12 DIAGNOSIS — E119 Type 2 diabetes mellitus without complications: Secondary | ICD-10-CM

## 2021-01-12 DIAGNOSIS — I10 Essential (primary) hypertension: Secondary | ICD-10-CM | POA: Diagnosis not present

## 2021-01-12 DIAGNOSIS — Z0001 Encounter for general adult medical examination with abnormal findings: Secondary | ICD-10-CM

## 2021-01-12 DIAGNOSIS — M25511 Pain in right shoulder: Secondary | ICD-10-CM | POA: Diagnosis not present

## 2021-01-12 DIAGNOSIS — Z0000 Encounter for general adult medical examination without abnormal findings: Secondary | ICD-10-CM

## 2021-01-12 DIAGNOSIS — Z96611 Presence of right artificial shoulder joint: Secondary | ICD-10-CM

## 2021-01-12 DIAGNOSIS — E7800 Pure hypercholesterolemia, unspecified: Secondary | ICD-10-CM

## 2021-01-12 NOTE — Progress Notes (Signed)
Subjective:    Patient ID: Paul Rubio, male    DOB: [birth year], 70 y.o.   MRN: [id number]  HPI Patient is a very pleasant 70 year old Caucasian male here today for a Medicare wellness visit.  He denies any falls.  He denies any depression.  He does have some mild memory loss.  He states that this is staying relatively stable.  He sometimes has to ask his wife what appointments he has for the day because he has a difficult time keeping them all straight.  He denies any trouble losing money or losing items.  He denies forgetting conversations.  He denies getting lost while driving.  He denies any trouble paying bills or handling his finances.  Review of his shot record shows that he has had all 3 doses of the COVID vaccine, both pneumonia vaccines, Shingrix, Tdap, and a flu shot.  Therefore he is fully vaccinated Immunization History  Administered Date(s) Administered  . Fluad Quad(high Dose 65+) 08/27/2019, 09/14/2020  . Influenza, High Dose Seasonal PF 09/12/2018  . Influenza,inj,Quad PF,6+ Mos 10/21/2013, 09/28/2014, 12/27/2015, 09/27/2016, 10/01/2017  . PFIZER(Purple Top)SARS-COV-2 Vaccination 01/25/2020, 02/15/2020, 09/04/2020  . Pneumococcal Conjugate-13 12/27/2015  . Pneumococcal Polysaccharide-23 12/17/2014  . Td 11/21/2012  . Tdap 11/21/2012  . Zoster Recombinat (Shingrix) 09/06/2019, 11/15/2019   His last colonoscopy was in 2018 and was clear until 2028.  He had a PSA recently that was 1.54 and within normal limits. Lab on 01/06/2021  Component Date Value Ref Range Status  . WBC 01/06/2021 8.0  3.8 - 10.8 Thousand/uL Final  . RBC 01/06/2021 4.28  4.20 - 5.80 Million/uL Final  . Hemoglobin 01/06/2021 11.7* 13.2 - 17.1 g/dL Final  . HCT 08/65/7846 36.9* 38.5 - 50.0 % Final  . MCV 01/06/2021 86.2  80.0 - 100.0 fL Final  . MCH 01/06/2021 27.3  27.0 - 33.0 pg Final  . MCHC 01/06/2021 31.7* 32.0 - 36.0 g/dL Final  . RDW 96/29/5284 12.7  11.0 - 15.0 % Final  . Platelets  01/06/2021 228  140 - 400 Thousand/uL Final  . MPV 01/06/2021 11.3  7.5 - 12.5 fL Final  . Neutro Abs 01/06/2021 4,504  1,500 - 7,800 cells/uL Final  . Lymphs Abs 01/06/2021 2,584  850 - 3,900 cells/uL Final  . Absolute Monocytes 01/06/2021 728  200 - 950 cells/uL Final  . Eosinophils Absolute 01/06/2021 152  15 - 500 cells/uL Final  . Basophils Absolute 01/06/2021 32  0 - 200 cells/uL Final  . Neutrophils Relative % 01/06/2021 56.3  % Final  . Total Lymphocyte 01/06/2021 32.3  % Final  . Monocytes Relative 01/06/2021 9.1  % Final  . Eosinophils Relative 01/06/2021 1.9  % Final  . Basophils Relative 01/06/2021 0.4  % Final  . Glucose, Bld 01/06/2021 116* 65 - 99 mg/dL Final   Comment: .            Fasting reference interval . For someone without known diabetes, a glucose value between 100 and 125 mg/dL is consistent with prediabetes and should be confirmed with a follow-up test. .   . BUN 01/06/2021 24  7 - 25 mg/dL Final  . Creat 13/24/4010 0.98  0.70 - 1.18 mg/dL Final   Comment: For patients >31 years of age, the reference limit for Creatinine is approximately 13% higher for people identified as African-American. .   . GFR, Est Non African American 01/06/2021 77  > OR = 60 mL/min/1.73m2 Final  . GFR, Est African American 01/06/2021 90  >  OR = 60 mL/min/1.373m2 Final  . BUN/Creatinine Ratio 01/06/2021 NOT APPLICABLE  6 - 22 (calc) Final  . Sodium 01/06/2021 142  135 - 146 mmol/L Final  . Potassium 01/06/2021 4.5  3.5 - 5.3 mmol/L Final  . Chloride 01/06/2021 103  98 - 110 mmol/L Final  . CO2 01/06/2021 31  20 - 32 mmol/L Final  . Calcium 01/06/2021 9.3  8.6 - 10.3 mg/dL Final  . Total Protein 01/06/2021 6.6  6.1 - 8.1 g/dL Final  . Albumin 16/10/960402/03/2021 3.8  3.6 - 5.1 g/dL Final  . Globulin 54/09/811902/03/2021 2.8  1.9 - 3.7 g/dL (calc) Final  . AG Ratio 01/06/2021 1.4  1.0 - 2.5 (calc) Final  . Total Bilirubin 01/06/2021 0.3  0.2 - 1.2 mg/dL Final  . Alkaline phosphatase (APISO)  01/06/2021 81  35 - 144 U/L Final  . AST 01/06/2021 16  10 - 35 U/L Final  . ALT 01/06/2021 16  9 - 46 U/L Final  . Hgb A1c MFr Bld 01/06/2021 6.5* <5.7 % of total Hgb Final   Comment: For someone without known diabetes, a hemoglobin A1c value of 6.5% or greater indicates that they may have  diabetes and this should be confirmed with a follow-up  test. . For someone with known diabetes, a value <7% indicates  that their diabetes is well controlled and a value  greater than or equal to 7% indicates suboptimal  control. A1c targets should be individualized based on  duration of diabetes, age, comorbid conditions, and  other considerations. . Currently, no consensus exists regarding use of hemoglobin A1c for diagnosis of diabetes for children. .   . Mean Plasma Glucose 01/06/2021 140  mg/dL Final  . eAG (mmol/L) 14/78/295602/03/2021 7.7  mmol/L Final  . Cholesterol 01/06/2021 109  <200 mg/dL Final  . HDL 21/30/865702/03/2021 44  > OR = 40 mg/dL Final  . Triglycerides 01/06/2021 83  <150 mg/dL Final  . LDL Cholesterol (Calc) 01/06/2021 48  mg/dL (calc) Final   Comment: Reference range: <100 . Desirable range <100 mg/dL for primary prevention;   <70 mg/dL for patients with CHD or diabetic patients  with > or = 2 CHD risk factors. Marland Kitchen. LDL-C is now calculated using the Martin-Hopkins  calculation, which is a validated novel method providing  better accuracy than the Friedewald equation in the  estimation of LDL-C.  Horald PollenMartin SS et al. Lenox AhrJAMA. 8469;629(522013;310(19): 2061-2068  (http://education.[facility].com/faq/FAQ164)   . Total CHOL/HDL Ratio 01/06/2021 2.5  <8.4<5.0 (calc) Final  . Non-HDL Cholesterol (Calc) 01/06/2021 65  <130 mg/dL (calc) Final   Comment: For patients with diabetes plus 1 major ASCVD risk  factor, treating to a non-HDL-C goal of <100 mg/dL  (LDL-C of <13<70 mg/dL) is considered a therapeutic  option.   . Creatinine, Urine 01/06/2021 248  20 - 320 mg/dL Final  . Microalb, Ur 24/40/102702/03/2021 1.6   mg/dL Final   Comment: Reference Range Not established   . Microalb Creat Ratio 01/06/2021 6  <30 mcg/mg creat Final   Comment: . The ADA defines abnormalities in albumin excretion as follows: Marland Kitchen. Albuminuria Category        Result (mcg/mg creatinine) . Normal to Mildly increased   <30 Moderately increased         30-299  Severely increased           > OR = 300 . The ADA recommends that at least two of three specimens collected within a 3-6 month period be abnormal before considering a  patient to be within a diagnostic category.   Marland Kitchen PSA 01/06/2021 1.54  < OR = 4.0 ng/mL Final   Comment: The total PSA value from this assay system is  standardized against the WHO standard. The test  result will be approximately 20% lower when compared  to the equimolar-standardized total PSA (Beckman  Coulter). Comparison of serial PSA results should be  interpreted with this fact in mind. . This test was performed using the Siemens  chemiluminescent method. Values obtained from  different assay methods cannot be used interchangeably. PSA levels, regardless of value, should not be interpreted as absolute evidence of the presence or absence of disease.    Past Medical History:  Diagnosis Date  . Coronary artery disease 2011    2 stents placed  . H/O hiatal hernia   . Hyperlipidemia    borderline; status post stress test x2, the last one 5-6 years ago and without significant abnormality per the patient  . Hypertension    for approximately 5 years  . Myocardial infarction (HCC) 2011  . Pre-diabetes   . Prediabetes    Past Surgical History:  Procedure Laterality Date  . CHOLECYSTECTOMY    . COLONOSCOPY    . CORONARY ANGIOPLASTY     DES to mid and distal RCA 05/15/10  . REVERSE SHOULDER ARTHROPLASTY Right 12/06/2020   Procedure: RIGHT REVERSE SHOULDER ARTHROPLASTY;  Surgeon: Cammy Copa, MD;  Location: [facility] OR;  Service: Orthopedics;  Laterality: Right;  . Right shoulder rotator  cuff arthropathy.  12/06/2020  . ROTATOR CUFF REPAIR Left 10/20/2013   Dr August Saucer  . SHOULDER ARTHROSCOPY WITH OPEN ROTATOR CUFF REPAIR Left 10/20/2013   Procedure: SHOULDER ARTHROSCOPY WITH OPEN ROTATOR CUFF REPAIR;  Surgeon: Cammy Copa, MD;  Location: [facility] OR;  Service: Orthopedics;  Laterality: Left;  Left shoulder arthroscopy, debridement, open biceps tenodesis and rotator cuff repair.   Current Outpatient Medications on File Prior to Visit  Medication Sig Dispense Refill  . APPLE CIDER VINEGAR PO Take 1,200 mg by mouth daily.    Marland Kitchen ascorbic acid (VITAMIN C) 500 MG tablet Take 500 mg by mouth daily.    Marland Kitchen aspirin EC 81 MG tablet Take 81 mg by mouth daily.    Marland Kitchen atorvastatin (LIPITOR) 40 MG tablet TAKE 1 TABLET(40 MG) BY MOUTH DAILY AT 6 PM (Patient taking differently: Take 40 mg by mouth every evening.) 90 tablet 3  . cetirizine (ZYRTEC) 10 MG tablet Take 10 mg by mouth at bedtime.    . cholecalciferol (VITAMIN D) 25 MCG (1000 UNIT) tablet Take 1,000 Units by mouth in the morning and at bedtime.    . gabapentin (NEURONTIN) 300 MG capsule Take 1 capsule (300 mg total) by mouth 3 (three) times daily. 21 capsule 0  . ibuprofen (ADVIL) 200 MG tablet Take 400 mg by mouth every 8 (eight) hours as needed (pain.).    Marland Kitchen losartan (COZAAR) 50 MG tablet TAKE 1 TABLET(50 MG) BY MOUTH DAILY (Patient taking differently: Take 50 mg by mouth daily.) 90 tablet 3  . metFORMIN (GLUCOPHAGE) 850 MG tablet Take 1 tablet (850 mg total) by mouth 2 (two) times daily with a meal. 180 tablet 1  . methocarbamol (ROBAXIN) 500 MG tablet Take 1 tablet (500 mg total) by mouth every 6 (six) hours as needed for muscle spasms. 30 tablet 0  . metoprolol tartrate (LOPRESSOR) 25 MG tablet TAKE 1/2 TABLET(12.5 MG) BY MOUTH TWICE DAILY (Patient taking differently: Take 12.5 mg by mouth 2 (two)  times daily.) 90 tablet 3  . Multiple Vitamin (MULTIVITAMIN WITH MINERALS) TABS tablet Take 1 tablet by mouth daily.    . nitroGLYCERIN  (NITROSTAT) 0.4 MG SL tablet Place 1 tablet (0.4 mg total) under the tongue every 5 (five) minutes as needed for chest pain. 25 tablet 5  . oxyCODONE (OXY IR/ROXICODONE) 5 MG immediate release tablet Take 1-2 tablets (5-10 mg total) by mouth every 4 (four) hours as needed for moderate pain (pain score 4-6). 30 tablet 0  . Zinc 50 MG TABS Take 50 mg by mouth daily.     No current facility-administered medications on file prior to visit.   No Known Allergies Social History   Socioeconomic History  . Marital status: Married    Spouse name: Not on file  . Number of children: 2  . Years of education: Not on file  . Highest education level: Not on file  Occupational History  . Not on file  Tobacco Use  . Smoking status: Former Smoker    Types: Cigarettes    Quit date: 12/03/1968    Years since quitting: 52.1  . Smokeless tobacco: Never Used  Vaping Use  . Vaping Use: Never used  Substance and Sexual Activity  . Alcohol use: No  . Drug use: No  . Sexual activity: Not on file  Other Topics Concern  . Not on file  Social History Narrative   Quit tobacco greater than 20 years ago with approximately 10 pack-year history. Denies alcohol or drug abuse   Social Determinants of Corporate investment banker Strain: Not on file  Food Insecurity: Not on file  Transportation Needs: Not on file  Physical Activity: Not on file  Stress: Not on file  Social Connections: Not on file  Intimate Partner Violence: Not on file   Family History  Problem Relation Age of Onset  . Heart disease Father   . Heart attack Other   . Sudden death Paternal Uncle        possible MI  . Colon cancer Neg Hx      Review of Systems  All other systems reviewed and are negative.      Objective:   Physical Exam Vitals reviewed.  Constitutional:      General: He is not in acute distress.    Appearance: He is well-developed. He is not diaphoretic.  HENT:     Head: Normocephalic and atraumatic.     Right  Ear: External ear normal.     Left Ear: External ear normal.     Nose: Nose normal.     Mouth/Throat:     Pharynx: No oropharyngeal exudate.  Eyes:     General: No scleral icterus.       Right eye: No discharge.        Left eye: No discharge.     Conjunctiva/sclera: Conjunctivae normal.     Pupils: Pupils are equal, round, and reactive to light.  Neck:     Thyroid: No thyromegaly.     Vascular: No JVD.     Trachea: No tracheal deviation.  Cardiovascular:     Rate and Rhythm: Normal rate and regular rhythm.     Heart sounds: Normal heart sounds. No murmur heard. No friction rub. No gallop.   Pulmonary:     Effort: Pulmonary effort is normal. No respiratory distress.     Breath sounds: Normal breath sounds. No stridor. No wheezing or rales.  Chest:     Chest wall: No  tenderness.  Abdominal:     General: Bowel sounds are normal. There is no distension.     Palpations: Abdomen is soft. There is no mass.     Tenderness: There is no abdominal tenderness. There is no guarding or rebound.  Musculoskeletal:        General: No tenderness. Normal range of motion.     Cervical back: Normal range of motion and neck supple.  Lymphadenopathy:     Cervical: No cervical adenopathy.  Skin:    General: Skin is warm.     Findings: Rash present.  Neurological:     Mental Status: He is alert and oriented to person, place, and time.     Cranial Nerves: No cranial nerve deficit.     Motor: No abnormal muscle tone.     Coordination: Coordination normal.     Deep Tendon Reflexes: Reflexes are normal and symmetric.  Psychiatric:        Behavior: Behavior normal.        Thought Content: Thought content normal.        Judgment: Judgment normal.           Assessment & Plan:  General medical exam  Benign essential HTN  Pure hypercholesterolemia  Controlled type 2 diabetes mellitus without complication, without long-term current use of insulin (HCC)  S/P reverse total shoulder  arthroplasty, right  Immunizations are up-to-date.  Colonoscopy is up-to-date.  PSA is up-to-date.  Cholesterol is outstanding.  Hemoglobin A1c is controlled at 6.5.  Memory loss remains stable and monitor.  Therefore we will continue simply to monitor this.  He denies any falls or depression.  My only concern on his lab work has been decline in his hemoglobin.  He was 14 last year.  Prior to surgery and December he was 11.7.  After surgery he dropped to 9.5.  However, he quickly rebounded back to 11.7 here in February.  He states that he is not eating as much beef.  I recommended trying a vitamin B-12 and iron supplement and then rechecking lab work in 3 to 6 months.  His last colonoscopy did show angiodysplasia in the colon so I believe a drop in his hemoglobin could be a combination of surgery, small amounts of GI blood loss due to his Plavix and the angiodysplasia, and iron and B12 deficiency.  If worsening, I would consult GI for an EGD

## 2021-01-17 DIAGNOSIS — M25511 Pain in right shoulder: Secondary | ICD-10-CM | POA: Diagnosis not present

## 2021-01-18 ENCOUNTER — Ambulatory Visit (INDEPENDENT_AMBULATORY_CARE_PROVIDER_SITE_OTHER): Payer: Medicare Other | Admitting: Orthopedic Surgery

## 2021-01-18 ENCOUNTER — Encounter: Payer: Self-pay | Admitting: Orthopedic Surgery

## 2021-01-18 DIAGNOSIS — Z96611 Presence of right artificial shoulder joint: Secondary | ICD-10-CM

## 2021-01-18 NOTE — Progress Notes (Signed)
Post-Op Visit Note   Patient: Paul Rubio           Date of Birth: [birth year]           MRN: [id number] Visit Date: 01/18/2021 PCP: Donita Brooks, MD   Assessment & Plan:  Chief Complaint:  Chief Complaint  Patient presents with  . Right Shoulder - Routine Post Op   Visit Diagnoses:  1. History of arthroplasty of right shoulder     Plan: Patient is a 70 year old male presents s/p right reverse shoulder arthroplasty on 1//22.  Patient states that he is doing well.  He has been released by physical therapy and is only doing a home exercise program at this point.  He feels he is making steady progress every single day.  Not having to take any medication for pain control.  He is mostly focusing on strengthening exercises with his exercise program.  On exam his incision is well-healed with no evidence of infection or dehiscence.  He has 45 degrees external rotation, 75 degrees abduction, 120 degrees forward flexion.  Axillary nerve is intact with deltoid firing.  Subscap with decent strength.  Overall he is doing well.  Cautioned him against any heavy lifting more than 15 pounds.  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  This should be okay considering the device that he uses only weighs about 5 pounds but recommended he follow-up in 6 weeks for clinical recheck with Dr. August Saucer and contact the office sooner if he starts to notice increasing pain or any concerning symptoms.  Patient agreed with this plan.  Follow-up in 6 weeks.  Follow-Up Instructions: No follow-ups on file.   Orders:  No orders of the defined types were placed in this encounter.  No orders of the defined types were placed in this encounter.   Imaging: No results found.  PMFS History: Patient Active Problem List   Diagnosis Date Noted  . Right lower lobe pneumonia 12/07/2020  . S/P reverse total shoulder arthroplasty, right 12/06/2020  . Type 2 diabetes mellitus with  complication, without long-term current use of insulin (HCC)   . Acute sinusitis 02/10/2014  . Acute bronchitis 02/10/2014  . ESSENTIAL HYPERTENSION, BENIGN 07/25/2010  . Mixed hyperlipidemia 05/31/2010  . CAD, NATIVE VESSEL 05/31/2010  . FATIGUE / MALAISE 05/31/2010  . HYPERTENSION, HX OF 05/31/2010   Past Medical History:  Diagnosis Date  . Coronary artery disease 2011    2 stents placed  . H/O hiatal hernia   . Hyperlipidemia    borderline; status post stress test x2, the last one 5-6 years ago and without significant abnormality per the patient  . Hypertension    for approximately 5 years  . Myocardial infarction (HCC) 2011  . Pre-diabetes   . Prediabetes     Family History  Problem Relation Age of Onset  . Heart disease Father   . Heart attack Other   . Sudden death Paternal Uncle        possible MI  . Colon cancer Neg Hx     Past Surgical History:  Procedure Laterality Date  . CHOLECYSTECTOMY    . COLONOSCOPY    . CORONARY ANGIOPLASTY     DES to mid and distal RCA 05/15/10  . REVERSE SHOULDER ARTHROPLASTY Right 12/06/2020   Procedure: RIGHT REVERSE SHOULDER ARTHROPLASTY;  Surgeon: Cammy Copa, MD;  Location: [facility] OR;  Service: Orthopedics;  Laterality: Right;  . Right shoulder  rotator cuff arthropathy.  12/06/2020  . ROTATOR CUFF REPAIR Left 10/20/2013   Dr August Saucer  . SHOULDER ARTHROSCOPY WITH OPEN ROTATOR CUFF REPAIR Left 10/20/2013   Procedure: SHOULDER ARTHROSCOPY WITH OPEN ROTATOR CUFF REPAIR;  Surgeon: Cammy Copa, MD;  Location: [facility] OR;  Service: Orthopedics;  Laterality: Left;  Left shoulder arthroscopy, debridement, open biceps tenodesis and rotator cuff repair.   Social History   Occupational History  . Not on file  Tobacco Use  . Smoking status: Former Smoker    Types: Cigarettes    Quit date: 12/03/1968    Years since quitting: 52.1  . Smokeless tobacco: Never Used  Vaping Use  . Vaping Use: Never used  Substance and Sexual Activity  .  Alcohol use: No  . Drug use: No  . Sexual activity: Not on file

## 2021-03-01 ENCOUNTER — Ambulatory Visit (INDEPENDENT_AMBULATORY_CARE_PROVIDER_SITE_OTHER): Payer: Medicare Other | Admitting: Orthopedic Surgery

## 2021-03-01 DIAGNOSIS — Z96611 Presence of right artificial shoulder joint: Secondary | ICD-10-CM

## 2021-03-01 MED ORDER — AMOXICILLIN 500 MG PO TABS: ORAL_TABLET | ORAL | 0 refills | Status: DC

## 2021-03-02 ENCOUNTER — Encounter: Payer: Self-pay | Admitting: Orthopedic Surgery

## 2021-03-02 NOTE — Progress Notes (Signed)
Post-Op Visit Note   Patient: Paul Rubio           Date of Birth: 06/16/1951           MRN: [id number] Visit Date: 03/01/2021 PCP: Donita Brooks, MD   Assessment & Plan:  Chief Complaint:  Chief Complaint  Patient presents with  . Right Shoulder - Follow-up   Visit Diagnoses:  1. History of arthroplasty of right shoulder     Plan: Patient is a 70 year old male who presents s/p right reverse shoulder arthroplasty 12/06/2020.  He is doing very well and denies any significant difficulties.  Has no complaints.  He sleeps through the night and is able to sleep on his right side.  Takes occasional Advil for pain control but that is it.  He has finished physical therapy.  He has returned to work where he is compliant with the 15 pound lifting restriction of the right shoulder.  On exam he has 50 degrees external rotation, 70 degrees abduction, 130 degrees forward flexion.  Incision has healed well without any evidence of infection or dehiscence.  Axillary nerve is intact with deltoid firing.  Subscap with excellent strength.  Plan for patient follow-up as needed.  He will continue home exercise program and understands he needs to take antibiotics prior to any dental work he has done for two years.  Follow-Up Instructions: No follow-ups on file.   Orders:  No orders of the defined types were placed in this encounter.  Meds ordered this encounter  Medications  . amoxicillin (AMOXIL) 500 MG tablet    Sig: Take 2 g 1 hr prior to dental procedure    Dispense:  10 tablet    Refill:  0    Imaging: No results found.  PMFS History: Patient Active Problem List   Diagnosis Date Noted  . Right lower lobe pneumonia 12/07/2020  . S/P reverse total shoulder arthroplasty, right 12/06/2020  . Type 2 diabetes mellitus with complication, without long-term current use of insulin (HCC)   . Acute sinusitis 02/10/2014  . Acute bronchitis 02/10/2014  . ESSENTIAL HYPERTENSION, BENIGN  07/25/2010  . Mixed hyperlipidemia 05/31/2010  . CAD, NATIVE VESSEL 05/31/2010  . FATIGUE / MALAISE 05/31/2010  . HYPERTENSION, HX OF 05/31/2010   Past Medical History:  Diagnosis Date  . Coronary artery disease 2011    2 stents placed  . H/O hiatal hernia   . Hyperlipidemia    borderline; status post stress test x2, the last one 5-6 years ago and without significant abnormality per the patient  . Hypertension    for approximately 5 years  . Myocardial infarction (HCC) 2011  . Pre-diabetes   . Prediabetes     Family History  Problem Relation Age of Onset  . Heart disease Father   . Heart attack Other   . Sudden death Paternal Uncle        possible MI  . Colon cancer Neg Hx     Past Surgical History:  Procedure Laterality Date  . CHOLECYSTECTOMY    . COLONOSCOPY    . CORONARY ANGIOPLASTY     DES to mid and distal RCA 05/15/10  . REVERSE SHOULDER ARTHROPLASTY Right 12/06/2020   Procedure: RIGHT REVERSE SHOULDER ARTHROPLASTY;  Surgeon: Cammy Copa, MD;  Location: [facility] OR;  Service: Orthopedics;  Laterality: Right;  . Right shoulder rotator cuff arthropathy.  12/06/2020  . ROTATOR CUFF REPAIR Left 10/20/2013   Dr August Saucer  . SHOULDER ARTHROSCOPY WITH OPEN ROTATOR  CUFF REPAIR Left 10/20/2013   Procedure: SHOULDER ARTHROSCOPY WITH OPEN ROTATOR CUFF REPAIR;  Surgeon: Cammy Copa, MD;  Location: [facility] OR;  Service: Orthopedics;  Laterality: Left;  Left shoulder arthroscopy, debridement, open biceps tenodesis and rotator cuff repair.   Social History   Occupational History  . Not on file  Tobacco Use  . Smoking status: Former Smoker    Types: Cigarettes    Quit date: 12/03/1968    Years since quitting: 52.2  . Smokeless tobacco: Never Used  Vaping Use  . Vaping Use: Never used  Substance and Sexual Activity  . Alcohol use: No  . Drug use: No  . Sexual activity: Not on file

## 2021-06-07 ENCOUNTER — Telehealth: Payer: Self-pay | Admitting: *Deleted

## 2021-06-07 MED ORDER — NIRMATRELVIR/RITONAVIR (PAXLOVID)TABLET: 3.0000 | ORAL_TABLET | Freq: Two times a day (BID) | ORAL | 0 refills | Status: AC

## 2021-06-07 NOTE — Telephone Encounter (Signed)
Patient tested positive for COVID on 06/07/2021 with home test.   Sx include slight cough, fever/chills, fatigue.  Advised to continue symptom management with OTC medications: Tylenol/ Ibuprofen for fever/ body aches, Mucinex/ Delsym for cough/ chest congestion, Afrin/Sudafed/nasal saline for sinus pressure/ nasal congestion  GFR 77. Order for Paxlovid sent to pharmacy. Advised possible SE include nausea, diarrhea, loss of taste and hypertension.  If chest pain, shortness of breath, fever >104 that is unresponsive to antipyretics noted, advised to go to ER for evaluation.   Advised that the CDC recommends the following criteria prior to ending isolation in vaccinated persons at least 5 days since symptoms onset, then additional 5 days with masking AND 3 days fever free without antipyretics (Tylenol or Ibuprofen) AND improvement in respiratory symptoms.

## 2021-06-09 ENCOUNTER — Other Ambulatory Visit: Payer: Self-pay | Admitting: Family Medicine

## 2021-07-10 DIAGNOSIS — Z85828 Personal history of other malignant neoplasm of skin: Secondary | ICD-10-CM | POA: Diagnosis not present

## 2021-07-10 DIAGNOSIS — C44519 Basal cell carcinoma of skin of other part of trunk: Secondary | ICD-10-CM | POA: Diagnosis not present

## 2021-07-10 DIAGNOSIS — C44319 Basal cell carcinoma of skin of other parts of face: Secondary | ICD-10-CM | POA: Diagnosis not present

## 2021-07-10 DIAGNOSIS — L570 Actinic keratosis: Secondary | ICD-10-CM | POA: Diagnosis not present

## 2021-07-10 DIAGNOSIS — D485 Neoplasm of uncertain behavior of skin: Secondary | ICD-10-CM | POA: Diagnosis not present

## 2021-08-24 ENCOUNTER — Telehealth: Payer: Medicare Other | Admitting: *Deleted

## 2021-09-02 DIAGNOSIS — S239XXA Sprain of unspecified parts of thorax, initial encounter: Secondary | ICD-10-CM | POA: Diagnosis not present

## 2021-09-26 ENCOUNTER — Telehealth: Payer: Self-pay | Admitting: *Deleted

## 2021-09-26 NOTE — Telephone Encounter (Signed)
error 

## 2021-09-26 NOTE — Telephone Encounter (Signed)
Error message

## 2021-10-29 ENCOUNTER — Other Ambulatory Visit: Payer: Self-pay | Admitting: Physician Assistant

## 2021-10-29 DIAGNOSIS — I2510 Atherosclerotic heart disease of native coronary artery without angina pectoris: Secondary | ICD-10-CM

## 2021-10-30 NOTE — Progress Notes (Signed)
Office Visit    Patient Name: Paul Rubio Date of Encounter: 10/31/2021  PCP:  Donita Brooks, MD   [facility]  Cardiologist:  Tonny Bollman, MD  Advanced Practice Provider:  No care team member to display Electrophysiologist:  None   Chief Complaint    Paul Rubio is a 70 y.o. male with a hx of CAD s/p DES x 2 to mid and distal RCA in 2011, hypertension, hyperlipidemia, DM2 presents today for follow up of coronary disease   Past Medical History    Past Medical History:  Diagnosis Date   Coronary artery disease 2011    2 stents placed   H/O hiatal hernia    Hyperlipidemia    borderline; status post stress test x2, the last one 5-6 years ago and without significant abnormality per the patient   Hypertension    for approximately 5 years   Myocardial infarction [facility]) 2011   Pre-diabetes    Prediabetes    Past Surgical History:  Procedure Laterality Date   CHOLECYSTECTOMY     COLONOSCOPY     CORONARY ANGIOPLASTY     DES to mid and distal RCA 05/15/10   REVERSE SHOULDER ARTHROPLASTY Right 12/06/2020   Procedure: RIGHT REVERSE SHOULDER ARTHROPLASTY;  Surgeon: Cammy Copa, MD;  Location: [facility] OR;  Service: Orthopedics;  Laterality: Right;   Right shoulder rotator cuff arthropathy.  12/06/2020   ROTATOR CUFF REPAIR Left 10/20/2013   Dr August Saucer   SHOULDER ARTHROSCOPY WITH OPEN ROTATOR CUFF REPAIR Left 10/20/2013   Procedure: SHOULDER ARTHROSCOPY WITH OPEN ROTATOR CUFF REPAIR;  Surgeon: Cammy Copa, MD;  Location: [facility] OR;  Service: Orthopedics;  Laterality: Left;  Left shoulder arthroscopy, debridement, open biceps tenodesis and rotator cuff repair.    Allergies  No Known Allergies  History of Present Illness    Paul Rubio is a 70 y.o. male with a hx of CAD s/p DES x 2 to mid and distal RCA in 2011, hypertension, hyperlipidemia, DM2  last seen 10/26/20.  He presented with NSTEMi 2011 and trested with DES x 2 to mid and  distal RCA. He was last seen 10/2020 and doing well from cardiac perspective. Since last seen underwent right reverse shoulder surgery 12/2020.   He presents today for follow up with his wife.  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  Reports he eats mostly at home and does not add much salt to food. Reports no shortness of breath nor dyspnea on exertion. Reports no chest pain, pressure, or tightness. No edema, orthopnea, PND. Reports no palpitations.    EKGs/Labs/Other Studies Reviewed:   The following studies were reviewed today:   EKG:  EKG is  ordered today.  The ekg ordered today demonstrates NSR 70 bpm with no acute ST/T wave changes.   Recent Labs: 01/06/2021: ALT 16; BUN 24; Creat 0.98; Hemoglobin 11.7; Platelets 228; Potassium 4.5; Sodium 142  Recent Lipid Panel    Component Value Date/Time   CHOL 109 01/06/2021 0912   TRIG 83 01/06/2021 0912   HDL 44 01/06/2021 0912   CHOLHDL 2.5 01/06/2021 0912   VLDL 13 12/28/2016 0830   LDLCALC 48 01/06/2021 0912    Home Medications   Current Meds  Medication Sig   amoxicillin (AMOXIL) 500 MG tablet Take 2 g 1 hr prior to dental procedure   APPLE CIDER VINEGAR PO Take 1,200 mg by mouth daily.   ascorbic acid (VITAMIN C) 500 MG tablet Take  500 mg by mouth daily.   aspirin EC 81 MG tablet Take 81 mg by mouth daily.   cetirizine (ZYRTEC) 10 MG tablet Take 10 mg by mouth at bedtime.   cholecalciferol (VITAMIN D) 25 MCG (1000 UNIT) tablet Take 1,000 Units by mouth in the morning and at bedtime.   ibuprofen (ADVIL) 200 MG tablet Take 400 mg by mouth every 8 (eight) hours as needed (pain.).   metFORMIN (GLUCOPHAGE) 850 MG tablet TAKE 1 TABLET(850 MG) BY MOUTH TWICE DAILY WITH A MEAL   Zinc 50 MG TABS Take 50 mg by mouth daily.   [DISCONTINUED] atorvastatin (LIPITOR) 40 MG tablet TAKE 1 TABLET(40 MG) BY MOUTH DAILY AT 6 PM (Patient taking differently: Take 40 mg by mouth every evening.)   [DISCONTINUED] losartan (COZAAR) 50 MG tablet TAKE 1  TABLET(50 MG) BY MOUTH DAILY (Patient taking differently: Take 50 mg by mouth daily.)   [DISCONTINUED] metoprolol tartrate (LOPRESSOR) 25 MG tablet TAKE 1/2 TABLET(12.5 MG) BY MOUTH TWICE DAILY (Patient taking differently: Take 12.5 mg by mouth 2 (two) times daily.)   [DISCONTINUED] nitroGLYCERIN (NITROSTAT) 0.4 MG SL tablet Place 1 tablet (0.4 mg total) under the tongue every 5 (five) minutes as needed for chest pain.     Review of Systems      All other systems reviewed and are otherwise negative except as noted above.  Physical Exam    VS:  BP 110/62   Pulse 70   Ht 5\' 10"  (1.778 m)   Wt 209 lb (94.8 kg)   BMI 29.99 kg/m  , BMI Body mass index is 29.99 kg/m.  Wt Readings from Last 3 Encounters:  10/31/21 209 lb (94.8 kg)  01/12/21 211 lb (95.7 kg)  12/06/20 212 lb (96.2 kg)    GEN: Well nourished, overweight, well developed, in no acute distress. HEENT: normal. Neck: Supple, no JVD, carotid bruits, or masses. Cardiac: RRR, no murmurs, rubs, or gallops. No clubbing, cyanosis, edema.  Radials/PT 2+ and equal bilaterally.  Respiratory:  Respirations regular and unlabored, clear to auscultation bilaterally. GI: Soft, nontender, nondistended. MS: No deformity or atrophy. Skin: Warm and dry, no rash. Neuro:  Strength and sensation are intact. Psych: Normal affect.  Assessment & Plan    CAD s/p DES x 2 to mid and distal RCA 2011 - Stable with no anginal symptoms. No indication for ischemic evaluation.  GDMT includes aspirin, metoprolol, atorvastatin, as needed nitroglycerin. Refills provided. Heart healthy diet and regular cardiovascular exercise encouraged.    HTN - BP well controlled. Continue current antihypertensive regimen including metoprolol 12.5 mg twice daily, losartan 50 mg daily.  Refills provided.  HLD, LDL goal <70 - 01/2021 total cholesterol 109, HDL 44, LDL 48, triglycerides 83.  Cholesterol numbers at goal.  Continue atorvastatin 40 mg daily.  Refill  provided.  DM2 -follows with PCP.  If additional agent needed consider addition of SGLT2 I for cardioprotective benefit.  Disposition: Follow up in 1 year(s) with 01/2021, MD or APP.  Signed, Tonny Bollman, NP 10/31/2021, 12:50 PM [facility]

## 2021-10-31 ENCOUNTER — Ambulatory Visit (HOSPITAL_BASED_OUTPATIENT_CLINIC_OR_DEPARTMENT_OTHER): Payer: Medicare Other | Admitting: Family

## 2021-10-31 ENCOUNTER — Encounter (HOSPITAL_BASED_OUTPATIENT_CLINIC_OR_DEPARTMENT_OTHER): Payer: Self-pay | Admitting: Family

## 2021-10-31 ENCOUNTER — Other Ambulatory Visit: Payer: Self-pay

## 2021-10-31 VITALS — BP 110/62 | HR 70 | Ht 70.0 in | Wt 209.0 lb

## 2021-10-31 DIAGNOSIS — I25118 Atherosclerotic heart disease of native coronary artery with other forms of angina pectoris: Secondary | ICD-10-CM

## 2021-10-31 DIAGNOSIS — I10 Essential (primary) hypertension: Secondary | ICD-10-CM | POA: Diagnosis not present

## 2021-10-31 DIAGNOSIS — E785 Hyperlipidemia, unspecified: Secondary | ICD-10-CM | POA: Diagnosis not present

## 2021-10-31 DIAGNOSIS — Z23 Encounter for immunization: Secondary | ICD-10-CM

## 2021-10-31 MED ORDER — METOPROLOL TARTRATE 25 MG PO TABS: 12.5000 mg | ORAL_TABLET | Freq: Two times a day (BID) | ORAL | 3 refills | Status: DC

## 2021-10-31 MED ORDER — NITROGLYCERIN 0.4 MG SL SUBL: 0.4000 mg | SUBLINGUAL_TABLET | SUBLINGUAL | 5 refills | Status: DC | PRN

## 2021-10-31 MED ORDER — LOSARTAN POTASSIUM 50 MG PO TABS: 50.0000 mg | ORAL_TABLET | Freq: Every day | ORAL | 3 refills | Status: DC

## 2021-10-31 MED ORDER — ATORVASTATIN CALCIUM 40 MG PO TABS: 40.0000 mg | ORAL_TABLET | Freq: Every day | ORAL | 3 refills | Status: DC

## 2021-10-31 NOTE — Patient Instructions (Signed)
Medication Instructions:  Your Physician recommend you continue on your current medication as directed.    *If you need a refill on your cardiac medications before your next appointment, please call your pharmacy*   Lab Work: None ordered today   Testing/Procedures: None ordered today    Follow-Up: At [facility], you and your health needs are our priority.  As part of our continuing mission to provide you with exceptional heart care, we have created designated Provider Care Teams.  These Care Teams include your primary Cardiologist (physician) and Advanced Practice Providers (APPs -  Physician Assistants and Nurse Practitioners) who all work together to provide you with the care you need, when you need it.  We recommend signing up for the patient portal called "MyChart".  Sign up information is provided on this After Visit Summary.  MyChart is used to connect with patients for Virtual Visits (Telemedicine).  Patients are able to view lab/test results, encounter notes, upcoming appointments, etc.  Non-urgent messages can be sent to your provider as well.   To learn more about what you can do with MyChart, go to [email or web address].    Your next appointment:   12 month(s)  The format for your next appointment:   In Person  Provider:   Tonny Bollman, MD  or APP    Other Instructions For coronary artery disease often called "heart disease" we aim for optimal guideline directed medical therapy. We use the "A, B, C"s to help keep [place] on track!  A = Aspirin 81mg  daily B = Beta blocker which helps to relax the heart. This is your Metoprolol. C = Cholesterol control. You take Atorvastatin to help control your cholesterol.  D = Don't forget nitroglycerin! This is an emergency tablet to be used if you have chest pain.

## 2022-01-04 ENCOUNTER — Other Ambulatory Visit: Payer: Self-pay | Admitting: Family Medicine

## 2022-01-15 DIAGNOSIS — L570 Actinic keratosis: Secondary | ICD-10-CM | POA: Diagnosis not present

## 2022-01-15 DIAGNOSIS — D1801 Hemangioma of skin and subcutaneous tissue: Secondary | ICD-10-CM | POA: Diagnosis not present

## 2022-01-15 DIAGNOSIS — L821 Other seborrheic keratosis: Secondary | ICD-10-CM | POA: Diagnosis not present

## 2022-01-15 DIAGNOSIS — Z85828 Personal history of other malignant neoplasm of skin: Secondary | ICD-10-CM | POA: Diagnosis not present

## 2022-03-27 ENCOUNTER — Other Ambulatory Visit: Payer: Medicare Other

## 2022-03-27 DIAGNOSIS — E119 Type 2 diabetes mellitus without complications: Secondary | ICD-10-CM | POA: Diagnosis not present

## 2022-03-27 DIAGNOSIS — I10 Essential (primary) hypertension: Secondary | ICD-10-CM

## 2022-03-27 DIAGNOSIS — E118 Type 2 diabetes mellitus with unspecified complications: Secondary | ICD-10-CM

## 2022-03-27 DIAGNOSIS — E782 Mixed hyperlipidemia: Secondary | ICD-10-CM

## 2022-03-27 DIAGNOSIS — R413 Other amnesia: Secondary | ICD-10-CM | POA: Diagnosis not present

## 2022-03-29 ENCOUNTER — Ambulatory Visit (INDEPENDENT_AMBULATORY_CARE_PROVIDER_SITE_OTHER): Payer: Medicare Other | Admitting: Family Medicine

## 2022-03-29 VITALS — BP 134/84 | HR 72 | Temp 98.4°F | Ht 69.0 in | Wt 209.2 lb

## 2022-03-29 DIAGNOSIS — E7800 Pure hypercholesterolemia, unspecified: Secondary | ICD-10-CM | POA: Diagnosis not present

## 2022-03-29 DIAGNOSIS — E119 Type 2 diabetes mellitus without complications: Secondary | ICD-10-CM | POA: Diagnosis not present

## 2022-03-29 DIAGNOSIS — I10 Essential (primary) hypertension: Secondary | ICD-10-CM | POA: Diagnosis not present

## 2022-03-29 DIAGNOSIS — Z0000 Encounter for general adult medical examination without abnormal findings: Secondary | ICD-10-CM | POA: Diagnosis not present

## 2022-03-29 DIAGNOSIS — I2510 Atherosclerotic heart disease of native coronary artery without angina pectoris: Secondary | ICD-10-CM

## 2022-03-29 DIAGNOSIS — R413 Other amnesia: Secondary | ICD-10-CM

## 2022-03-29 LAB — COMPREHENSIVE METABOLIC PANEL
AG Ratio: 1.3 (calc) (ref 1.0–2.5)
ALT: 17 U/L (ref 9–46)
AST: 17 U/L (ref 10–35)
Albumin: 4 g/dL (ref 3.6–5.1)
Alkaline phosphatase (APISO): 67 U/L (ref 35–144)
BUN: 21 mg/dL (ref 7–25)
CO2: 29 mmol/L (ref 20–32)
Calcium: 9.6 mg/dL (ref 8.6–10.3)
Chloride: 104 mmol/L (ref 98–110)
Creat: 1.06 mg/dL (ref 0.70–1.28)
Globulin: 3.1 g/dL (calc) (ref 1.9–3.7)
Glucose, Bld: 107 mg/dL — ABNORMAL HIGH (ref 65–99)
Potassium: 4.8 mmol/L (ref 3.5–5.3)
Sodium: 140 mmol/L (ref 135–146)
Total Bilirubin: 0.5 mg/dL (ref 0.2–1.2)
Total Protein: 7.1 g/dL (ref 6.1–8.1)

## 2022-03-29 LAB — HEMOGLOBIN A1C
Hgb A1c MFr Bld: 6.6 % of total Hgb — ABNORMAL HIGH (ref <5.7)
Mean Plasma Glucose: 143 mg/dL
eAG (mmol/L): 7.9 mmol/L

## 2022-03-29 LAB — CBC WITH DIFFERENTIAL/PLATELET
Absolute Monocytes: 608 cells/uL (ref 200–950)
Basophils Absolute: 30 cells/uL (ref 0–200)
Basophils Relative: 0.4 %
Eosinophils Absolute: 98 cells/uL (ref 15–500)
Eosinophils Relative: 1.3 %
HCT: 41.9 % (ref 38.5–50.0)
Hemoglobin: 13.7 g/dL (ref 13.2–17.1)
Lymphs Abs: 2288 cells/uL (ref 850–3900)
MCH: 29.2 pg (ref 27.0–33.0)
MCHC: 32.7 g/dL (ref 32.0–36.0)
MCV: 89.3 fL (ref 80.0–100.0)
MPV: 11.2 fL (ref 7.5–12.5)
Monocytes Relative: 8.1 %
Neutro Abs: 4478 cells/uL (ref 1500–7800)
Neutrophils Relative %: 59.7 %
Platelets: 284 10*3/uL (ref 140–400)
RBC: 4.69 10*6/uL (ref 4.20–5.80)
RDW: 12.4 % (ref 11.0–15.0)
Total Lymphocyte: 30.5 %
WBC: 7.5 10*3/uL (ref 3.8–10.8)

## 2022-03-29 LAB — TEST AUTHORIZATION

## 2022-03-29 LAB — LIPID PANEL
Cholesterol: 102 mg/dL (ref <200)
HDL: 43 mg/dL (ref >=40)
LDL Cholesterol (Calc): 45 mg/dL (calc)
Non-HDL Cholesterol (Calc): 59 mg/dL (calc) (ref <130)
Total CHOL/HDL Ratio: 2.4 (calc) (ref <5.0)
Triglycerides: 55 mg/dL (ref <150)

## 2022-03-29 LAB — VITAMIN B12: Vitamin B-12: 340 pg/mL (ref 200–1100)

## 2022-03-29 LAB — PSA: PSA: 1.49 ng/mL (ref <=4.00)

## 2022-03-29 LAB — TSH: TSH: 1.57 mIU/L (ref 0.40–4.50)

## 2022-03-29 MED ORDER — DONEPEZIL HCL 5 MG PO TABS: 5.0000 mg | ORAL_TABLET | Freq: Every day | ORAL | 1 refills | Status: DC

## 2022-03-29 NOTE — Progress Notes (Signed)
? ?Subjective:  ? ? Patient ID: Paul Rubio, male    DOB: 1951-09-06, 71 y.o.   MRN: [id number] ? ?HPI ?Patient is here today for his complete physical exam.  Unfortunately he has progressive memory problems.  He is here with his wife.  Last year it was mundane things.  This year is more significant.  He is having a difficult time remembering conversations.  He and his wife will speak about something and he will have no recollection of the conversation later that day.  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  He has trouble thinking of people's names.  However he denies word finding difficulties.  He denies any falls.  He denies any apraxia.  He denies any balance issues or headaches.  However he has recently lost his taste.  He states that he has a difficult time tasting any protein or meat.  He can only taste salty foods.  Things are not tasting right to him.  He also reports that he is losing items.  In the past he has never had issues losing his keys or losing stones or losing items that he carries in his pocket.  However, he states that he is now laying face down forgetting where he put them.  His Mini-Mental status today is still 30/30 ?Immunization History  ?Administered Date(s) Administered  ? Fluad Quad(high Dose 65+) 08/27/2019, 09/14/2020, 10/31/2021  ? Influenza, High Dose Seasonal PF 09/12/2018  ? Influenza,inj,Quad PF,6+ Mos 10/21/2013, 09/28/2014, 12/27/2015, 09/27/2016, 10/01/2017  ? PFIZER(Purple Top)SARS-COV-2 Vaccination 01/25/2020, 02/15/2020, 09/04/2020  ? Research officer, [employer] Covid-19 Vaccine Bivalent Booster 875yrs & up 12/24/2021  ? Pneumococcal Conjugate-13 12/27/2015  ? Pneumococcal Polysaccharide-23 12/17/2014  ? Td 11/21/2012  ? Tdap 11/21/2012  ? Zoster Recombinat (Shingrix) 09/06/2019, 11/15/2019  ? ?Lab on 03/27/2022  ?Component Date Value Ref Range Status  ? PSA 03/27/2022 1.49  < OR = 4.00 ng/mL Final  ? Comment: The total PSA value from this assay system is   ?standardized against the WHO standard. The test  ?result will be approximately 20% lower when compared  ?to the equimolar-standardized total PSA (Beckman  ?Coulter). Comparison of serial PSA results should be  ?interpreted with this fact in mind. ?. ?This test was performed using the Siemens  ?chemiluminescent method. Values obtained from  ?different assay methods cannot be used ?interchangeably. PSA levels, regardless of ?value, should not be interpreted as absolute ?evidence of the presence or absence of disease. ?  ? Hgb A1c MFr Bld 03/27/2022 6.6 (H)  <5.7 % of total Hgb Final  ? Comment: For someone without known diabetes, a hemoglobin A1c ?value of 6.5% or greater indicates that they may have  ?diabetes and this should be confirmed with a follow-up  ?test. ?. ?For someone with known diabetes, a value <7% indicates  ?that their diabetes is well controlled and a value  ?greater than or equal to 7% indicates suboptimal  ?control. A1c targets should be individualized based on  ?duration of diabetes, age, comorbid conditions, and  ?other considerations. ?. ?Currently, no consensus exists regarding use of ?hemoglobin A1c for diagnosis of diabetes for children. ?. ?  ? Mean Plasma Glucose 03/27/2022 143  mg/dL Final  ? eAG (mmol/L) 40/98/119104/25/2023 7.9  mmol/L Final  ? Cholesterol 03/27/2022 102  <200 mg/dL Final  ? HDL 47/82/956204/25/2023 43  > OR = 40 mg/dL Final  ? Triglycerides 03/27/2022 55  <150 mg/dL Final  ? LDL Cholesterol (Calc) 03/27/2022 45  mg/dL (calc)  Final  ? Comment: Reference range: <100 ?Marland Kitchen ?Desirable range <100 mg/dL for primary prevention;   ?<70 mg/dL for patients with CHD or diabetic patients  ?with > or = 2 CHD risk factors. ?. ?LDL-C is now calculated using the Martin-Hopkins  ?calculation, which is a validated novel method providing  ?better accuracy than the Friedewald equation in the  ?estimation of LDL-C.  ?Horald Pollen et al. Lenox Ahr. 5449;201(00): 2061-2068   ?(http://education.[employer].com/faq/FAQ164) ?  ? Total CHOL/HDL Ratio 03/27/2022 2.4  <7.1 (calc) Final  ? Non-HDL Cholesterol (Calc) 03/27/2022 59  <130 mg/dL (calc) Final  ? Comment: For patients with diabetes plus 1 major ASCVD risk  ?factor, treating to a non-HDL-C goal of <100 mg/dL  ?(LDL-C of <70 mg/dL) is considered a therapeutic  ?option. ?  ? WBC 03/27/2022 7.5  3.8 - 10.8 Thousand/uL Final  ? RBC 03/27/2022 4.69  4.20 - 5.80 Million/uL Final  ? Hemoglobin 03/27/2022 13.7  13.2 - 17.1 g/dL Final  ? HCT 21/97/5883 41.9  38.5 - 50.0 % Final  ? MCV 03/27/2022 89.3  80.0 - 100.0 fL Final  ? MCH 03/27/2022 29.2  27.0 - 33.0 pg Final  ? MCHC 03/27/2022 32.7  32.0 - 36.0 g/dL Final  ? RDW 25/49/8264 12.4  11.0 - 15.0 % Final  ? Platelets 03/27/2022 284  140 - 400 Thousand/uL Final  ? MPV 03/27/2022 11.2  7.5 - 12.5 fL Final  ? Neutro Abs 03/27/2022 4,478  1,500 - 7,800 cells/uL Final  ? Lymphs Abs 03/27/2022 2,288  850 - 3,900 cells/uL Final  ? Absolute Monocytes 03/27/2022 608  200 - 950 cells/uL Final  ? Eosinophils Absolute 03/27/2022 98  15 - 500 cells/uL Final  ? Basophils Absolute 03/27/2022 30  0 - 200 cells/uL Final  ? Neutrophils Relative % 03/27/2022 59.7  % Final  ? Total Lymphocyte 03/27/2022 30.5  % Final  ? Monocytes Relative 03/27/2022 8.1  % Final  ? Eosinophils Relative 03/27/2022 1.3  % Final  ? Basophils Relative 03/27/2022 0.4  % Final  ? Glucose, Bld 03/27/2022 107 (H)  65 - 99 mg/dL Final  ? Comment: . ?           Fasting reference interval ?. ?For someone without known diabetes, a glucose value ?between 100 and 125 mg/dL is consistent with ?prediabetes and should be confirmed with a ?follow-up test. ?. ?  ? BUN 03/27/2022 21  7 - 25 mg/dL Final  ? Creat 15/83/0940 1.06  0.70 - 1.28 mg/dL Final  ? BUN/Creatinine Ratio 03/27/2022 NOT APPLICABLE  6 - 22 (calc) Final  ? Sodium 03/27/2022 140  135 - 146 mmol/L Final  ? Potassium 03/27/2022 4.8  3.5 - 5.3 mmol/L Final  ? Chloride  03/27/2022 104  98 - 110 mmol/L Final  ? CO2 03/27/2022 29  20 - 32 mmol/L Final  ? Calcium 03/27/2022 9.6  8.6 - 10.3 mg/dL Final  ? Total Protein 03/27/2022 7.1  6.1 - 8.1 g/dL Final  ? Albumin 76/80/8811 4.0  3.6 - 5.1 g/dL Final  ? Globulin 02/14/9457 3.1  1.9 - 3.7 g/dL (calc) Final  ? AG Ratio 03/27/2022 1.3  1.0 - 2.5 (calc) Final  ? Total Bilirubin 03/27/2022 0.5  0.2 - 1.2 mg/dL Final  ? Alkaline phosphatase (APISO) 03/27/2022 67  35 - 144 U/L Final  ? AST 03/27/2022 17  10 - 35 U/L Final  ? ALT 03/27/2022 17  9 - 46 U/L Final  ? ?Past Medical History:  ?  Diagnosis Date  ? Coronary artery disease 2011  ?  2 stents placed  ? H/O hiatal hernia   ? Hyperlipidemia   ? borderline; status post stress test x2, the last one 5-6 years ago and without significant abnormality per the patient  ? Hypertension   ? for approximately 5 years  ? Myocardial infarction [facility]) 2011  ? Pre-diabetes   ? Prediabetes   ? ?Past Surgical History:  ?Procedure Laterality Date  ? CHOLECYSTECTOMY    ? COLONOSCOPY    ? CORONARY ANGIOPLASTY    ? DES to mid and distal RCA 05/15/10  ? REVERSE SHOULDER ARTHROPLASTY Right 12/06/2020  ? Procedure: RIGHT REVERSE SHOULDER ARTHROPLASTY;  Surgeon: Cammy Copa, MD;  Location: [facility] OR;  Service: Orthopedics;  Laterality: Right;  ? Right shoulder rotator cuff arthropathy.  12/06/2020  ? ROTATOR CUFF REPAIR Left 10/20/2013  ? Dr August Saucer  ? SHOULDER ARTHROSCOPY WITH OPEN ROTATOR CUFF REPAIR Left 10/20/2013  ? Procedure: SHOULDER ARTHROSCOPY WITH OPEN ROTATOR CUFF REPAIR;  Surgeon: Cammy Copa, MD;  Location: [facility] OR;  Service: Orthopedics;  Laterality: Left;  Left shoulder arthroscopy, debridement, open biceps tenodesis and rotator cuff repair.  ? ?Current Outpatient Medications on File Prior to Visit  ?Medication Sig Dispense Refill  ? amoxicillin (AMOXIL) 500 MG tablet Take 2 g 1 hr prior to dental procedure 10 tablet 0  ? APPLE CIDER VINEGAR PO Take 1,200 mg by mouth daily.    ? ascorbic acid  (VITAMIN C) 500 MG tablet Take 500 mg by mouth daily.    ? aspirin EC 81 MG tablet Take 81 mg by mouth daily.    ? atorvastatin (LIPITOR) 40 MG tablet Take 1 tablet (40 mg total) by mouth daily. 90 tablet 3  ? cetirizine (ZYRTEC) 10 MG tablet Take 10 mg by mouth at bedti

## 2022-04-02 ENCOUNTER — Other Ambulatory Visit: Payer: Self-pay | Admitting: Family Medicine

## 2022-04-03 NOTE — Telephone Encounter (Signed)
Requested Prescriptions  ?Pending Prescriptions Disp Refills  ?? metFORMIN (GLUCOPHAGE) 850 MG tablet [Pharmacy Med Name: METFORMIN $RemoveBeforeD'850MG'[id number]$  TABLETS] 180 tablet 0  ?  Sig: TAKE 1 TABLET(850 MG) BY MOUTH TWICE DAILY WITH A MEAL  ?  ? Endocrinology:  Diabetes - Biguanides Failed - 04/02/2022  3:44 PM  ?  ?  Failed - eGFR in normal range and within 360 days  ?  GFR, Est African American  ?Date Value Ref Range Status  ?01/06/2021 90 > OR = 60 mL/min/1.51m2 Final  ? ?GFR, Est Non African American  ?Date Value Ref Range Status  ?01/06/2021 77 > OR = 60 mL/min/1.47m2 Final  ? ?GFR  ?Date Value Ref Range Status  ?07/21/2013 73.89 >60.00 mL/min Final  ?   ?  ?  Failed - Valid encounter within last 6 months  ?  Recent Outpatient Visits   ?      ? 5 days ago General medical exam  ? [facility] Susy Frizzle, MD  ? 1 year ago General medical exam  ? [facility] Susy Frizzle, MD  ? 1 year ago Controlled type 2 diabetes mellitus without complication, without long-term current use of insulin (No Name)  ? [facility] [name], [name], MD  ? 2 years ago Controlled type 2 diabetes mellitus without complication, without long-term current use of insulin ([place])  ? [facility] [name], [name], MD  ? 3 years ago URI, acute  ? [facility] [name], [name], MD  ?  ?  ? ?  ?  ?  Passed - Cr in normal range and within 360 days  ?  Creat  ?Date Value Ref Range Status  ?03/27/2022 1.06 0.70 - 1.28 mg/dL Final  ? ?Creatinine, Urine  ?Date Value Ref Range Status  ?01/06/2021 248 20 - 320 mg/dL Final  ?   ?  ?  Passed - HBA1C is between 0 and 7.9 and within 180 days  ?  Hgb A1c MFr Bld  ?Date Value Ref Range Status  ?03/27/2022 6.6 (H) <5.7 % of total Hgb Final  ?  Comment:  ?  For someone without known diabetes, a hemoglobin A1c ?value of 6.5% or greater indicates that they may have  ?diabetes and this should be confirmed with a follow-up  ?test. ?. ?For  someone with known diabetes, a value <7% indicates  ?that their diabetes is well controlled and a value  ?greater than or equal to 7% indicates suboptimal  ?control. A1c targets should be individualized based on  ?duration of diabetes, age, comorbid conditions, and  ?other considerations. ?. ?Currently, no consensus exists regarding use of ?hemoglobin A1c for diagnosis of diabetes for children. ?. ?  ?   ?  ?  Passed - B12 Level in normal range and within 720 days  ?  Vitamin B-12  ?Date Value Ref Range Status  ?03/27/2022 340 200 - 1,100 pg/mL Final  ?  Comment:  ?  . ?Please Note: Although the reference range for vitamin ?B12 is [phone] pg/mL, it has been reported that between ?5 and 10% of patients with values between 200 and 400 ?pg/mL may experience neuropsychiatric and hematologic ?abnormalities due to occult B12 deficiency; less than 1% ?of patients with values above 400 pg/mL will have symptoms. ?. ?  ?   ?  ?  Passed - CBC within normal limits and completed in the last 12 months  ?  WBC  ?  Date Value Ref Range Status  ?03/27/2022 7.5 3.8 - 10.8 Thousand/uL Final  ? ?RBC  ?Date Value Ref Range Status  ?03/27/2022 4.69 4.20 - 5.80 Million/uL Final  ? ?Hemoglobin  ?Date Value Ref Range Status  ?03/27/2022 13.7 13.2 - 17.1 g/dL Final  ? ?HCT  ?Date Value Ref Range Status  ?03/27/2022 41.9 38.5 - 50.0 % Final  ? ?MCHC  ?Date Value Ref Range Status  ?03/27/2022 32.7 32.0 - 36.0 g/dL Final  ? ?MCH  ?Date Value Ref Range Status  ?03/27/2022 29.2 27.0 - 33.0 pg Final  ? ?MCV  ?Date Value Ref Range Status  ?03/27/2022 89.3 80.0 - 100.0 fL Final  ? ?No results found for: PLTCOUNTKUC, LABPLAT, [place] ?RDW  ?Date Value Ref Range Status  ?03/27/2022 12.4 11.0 - 15.0 % Final  ? ?  ?  ?  ? ?

## 2022-04-14 ENCOUNTER — Ambulatory Visit
Admission: RE | Admit: 2022-04-14 | Discharge: 2022-04-14 | Disposition: A | Discharge status: Home / Self Care | Payer: Medicare Other | Source: Ambulatory Visit | Attending: Family Medicine | Admitting: Family Medicine

## 2022-04-14 DIAGNOSIS — R413 Other amnesia: Secondary | ICD-10-CM | POA: Diagnosis not present

## 2022-04-14 DIAGNOSIS — I6782 Cerebral ischemia: Secondary | ICD-10-CM | POA: Diagnosis not present

## 2022-04-14 DIAGNOSIS — I6381 Other cerebral infarction due to occlusion or stenosis of small artery: Secondary | ICD-10-CM | POA: Diagnosis not present

## 2022-05-21 IMAGING — CT CT ANGIO CHEST
2 of 4 series · 17 of 46 positions shown · IV contrast (omnipaque)
Comparison: Chest radiograph December 07, 2020

CLINICAL DATA: Shortness of breath. Right shoulder replacement 1
day prior

EXAM:
CT ANGIOGRAPHY CHEST WITH CONTRAST
TECHNIQUE: Multidetector CT imaging of the chest was performed using the
standard protocol during bolus administration of intravenous
contrast. Multiplanar CT image reconstructions and MIPs were
obtained to evaluate the vascular anatomy.
CONTRAST:  75mL OMNIPAQUE IOHEXOL 350 MG/ML SOLN

[Series 6: thins · axial · 0.98mm/px · z∈[+1251,+1483]mm · 14 of 268 slices shown]
[im 18/268  lung]
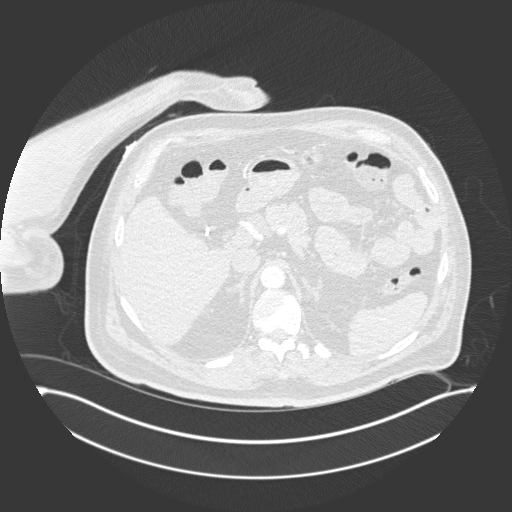
[im 35/268  soft-tissue]
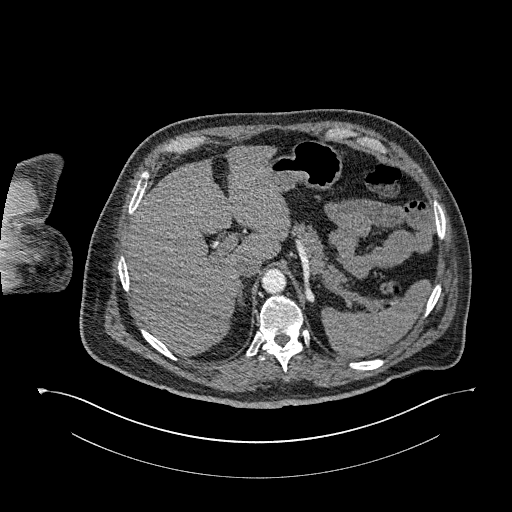
[im 52/268  lung]
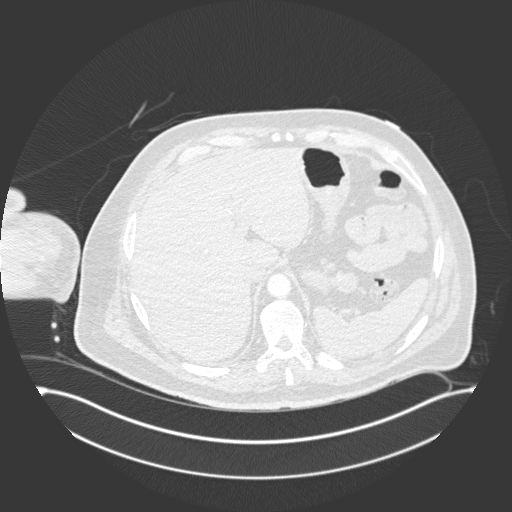
[im 69/268  soft-tissue]
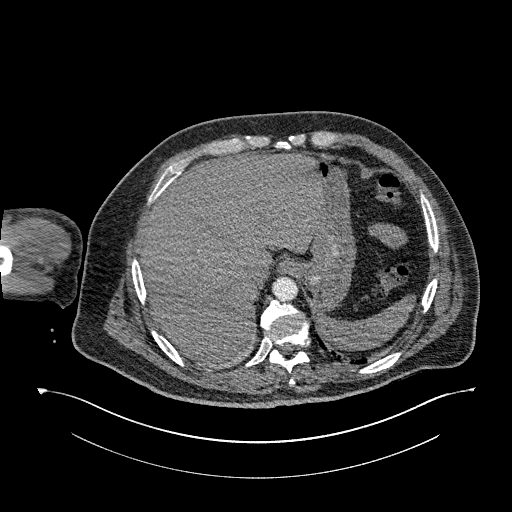
[im 87/268  lung]
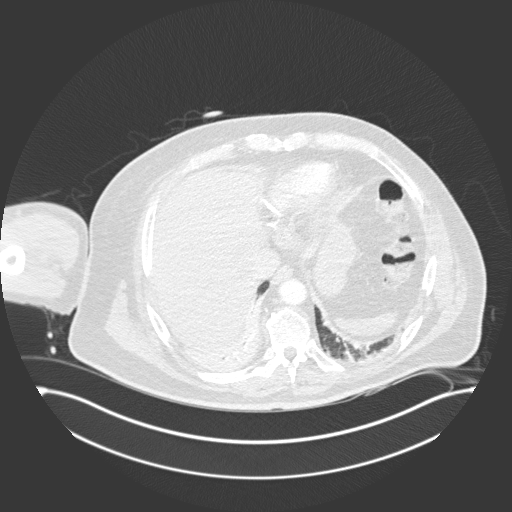
[im 104/268  soft-tissue]
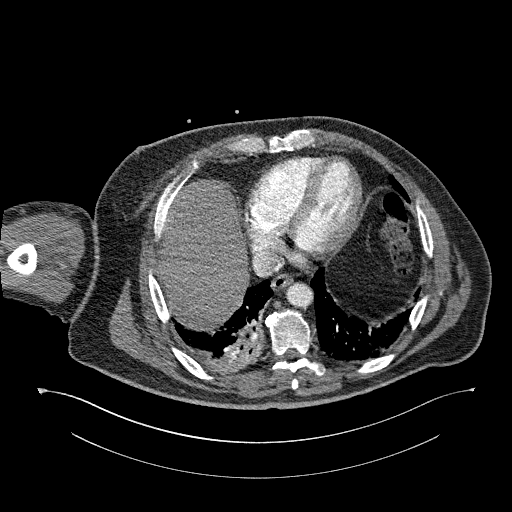
[im 121/268  lung]
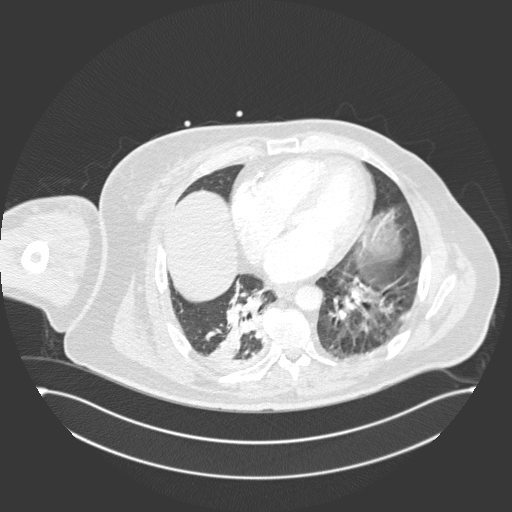
[im 147/268  soft-tissue]
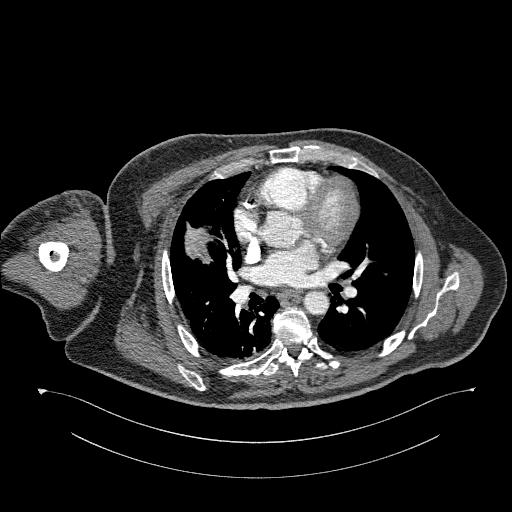
[im 164/268  lung]
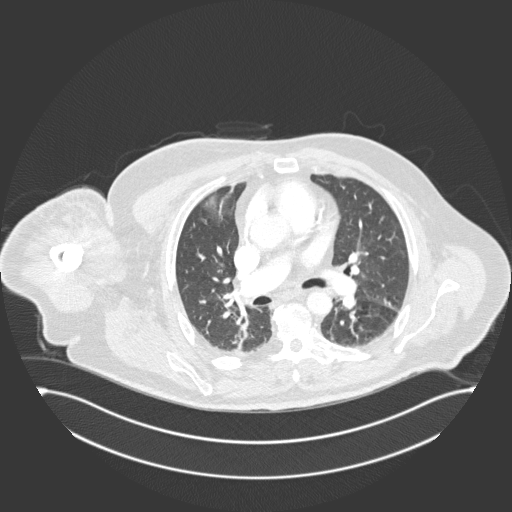
[im 181/268  soft-tissue]
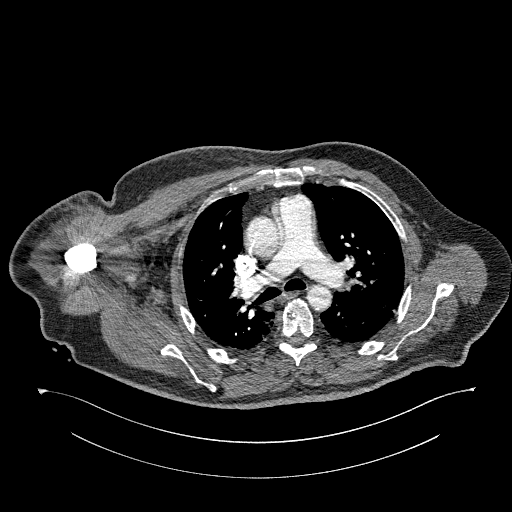
[im 199/268  lung]
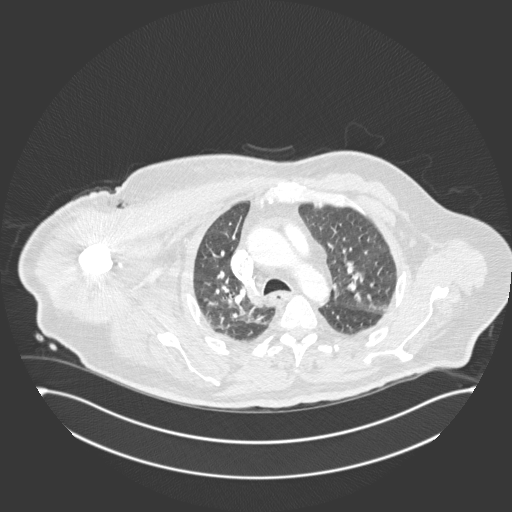
[im 216/268  soft-tissue]
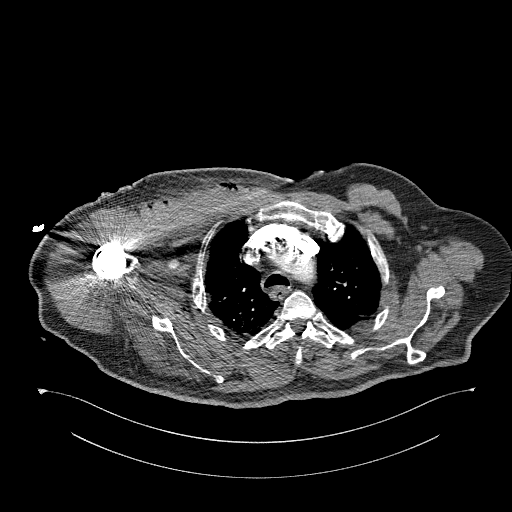
[im 233/268  lung]
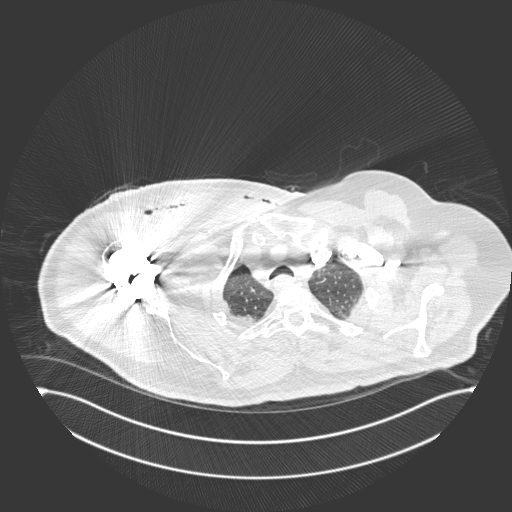
[im 250/268  soft-tissue]
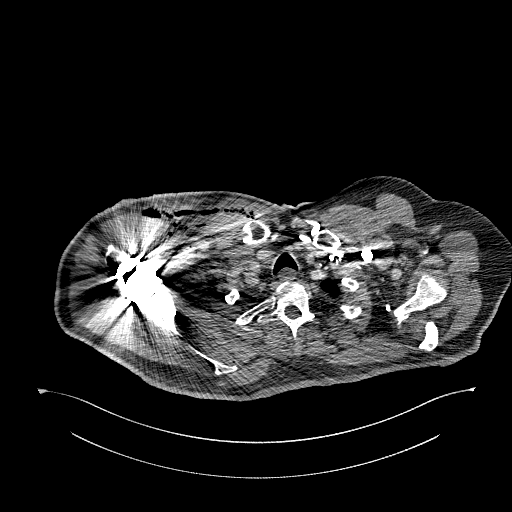

[Series 8: coronal mpr · coronal · 0.59mm/px · 3 of 151 slices shown]
[im 51/151  soft-tissue]
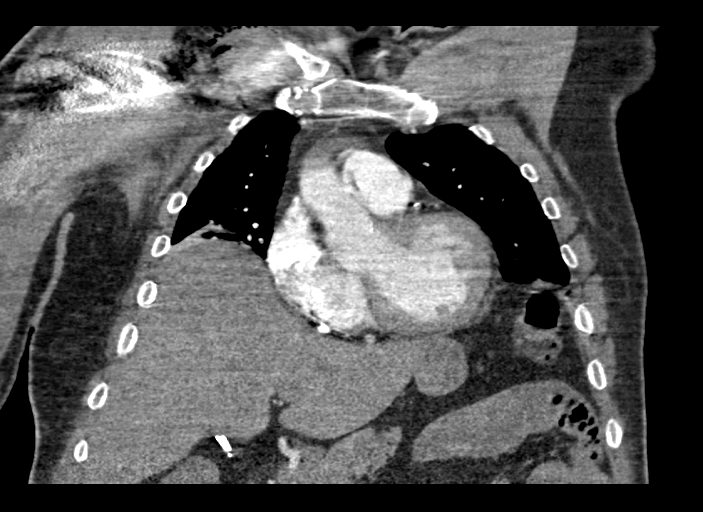
[im 67/151  soft-tissue]
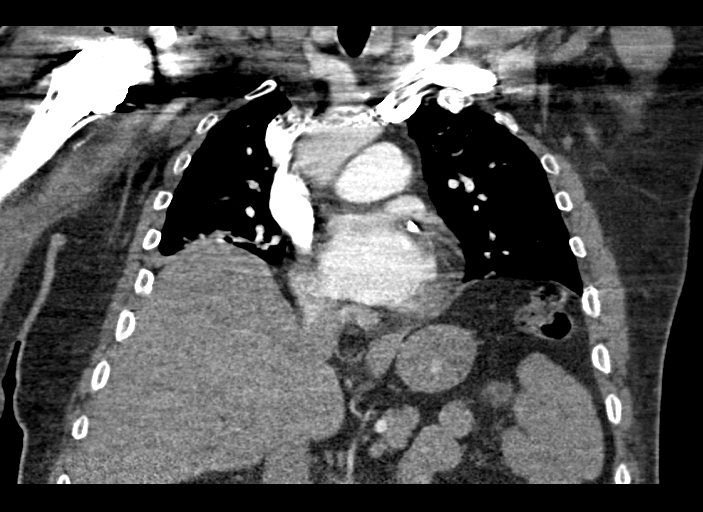
[im 84/151  soft-tissue]
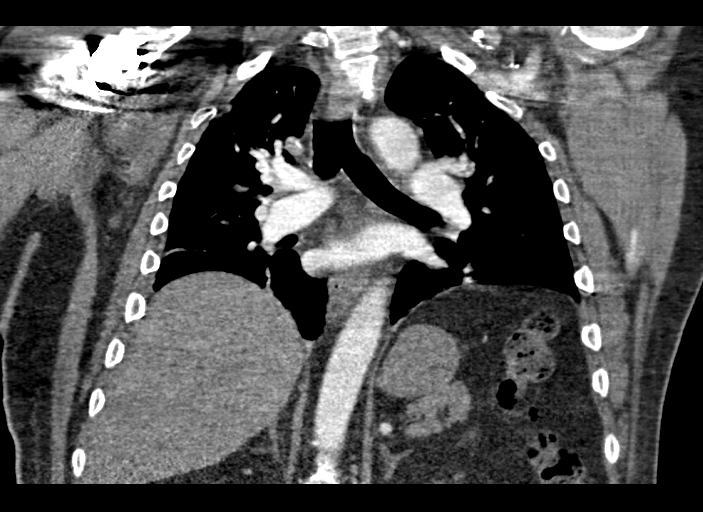

[17 of 46 positions shown; findings below may reference images not displayed]

FINDINGS: Cardiovascular: There is no demonstrable pulmonary embolus. There is
no thoracic aortic aneurysm or dissection. Visualized great vessels
appear normal. There is slight aortic atherosclerosis. There are
foci of coronary artery calcification at multiple sites. There is no
pericardial effusion or pericardial thickening.

Mediastinum/Nodes: Visualized thyroid appears normal. No evident
thoracic adenopathy. No esophageal lesions are appreciable.

Lungs/Pleura: There is airspace consolidation consistent with
pneumonia with superimposed atelectasis in the right lower lobe,
primarily medially and posteriorly. There is atelectatic change in
the left lower lobe. Atelectatic changes also noted in the right
middle lobe. No appreciable pleural effusions.

Upper Abdomen: Gallbladder is absent. Visualized upper abdominal
structures otherwise normal.

Musculoskeletal: Total shoulder replacement on the right. There is a
sizable joint effusion in the right shoulder area with multiple foci
of air within the shoulder joint and tracking into the anterior
right hemithorax consistent with recent surgery. Edema of the right
pectoral muscles is likely of postoperative etiology. No blastic or
lytic bone lesions are evident. No acute appearing fracture evident.

Review of the MIP images confirms the above findings.
IMPRESSION: 1. No demonstrable pulmonary embolus. No thoracic aortic aneurysm or
dissection. Mild aortic atherosclerosis. Foci of coronary artery
calcification noted.

2. Airspace consolidation consistent with pneumonia, likely with
superimposed atelectasis in the right lower lobe. Areas of patchy
atelectasis in the left lower lobe and right middle lobe also noted.

3.  No adenopathy.

4. Status post total shoulder replacement on the right with moderate
fluid and extensive air in the right shoulder as well as tracking
into the anterior right hemithorax consistent with recent surgery.
Edema of the pectoralis muscles on the right is likely of recent
postoperative etiology.

5.  Absent gallbladder.

Aortic Atherosclerosis (M58LU-XME.E).

## 2022-06-29 ENCOUNTER — Ambulatory Visit (INDEPENDENT_AMBULATORY_CARE_PROVIDER_SITE_OTHER): Payer: Medicare Other | Admitting: Family Medicine

## 2022-06-29 VITALS — BP 120/72 | HR 65 | Temp 98.5°F | Ht 70.8 in | Wt 205.0 lb

## 2022-06-29 DIAGNOSIS — R413 Other amnesia: Secondary | ICD-10-CM | POA: Diagnosis not present

## 2022-06-29 MED ORDER — DONEPEZIL HCL 10 MG PO TABS: 10.0000 mg | ORAL_TABLET | Freq: Every day | ORAL | 3 refills | Status: DC

## 2022-06-29 NOTE — Progress Notes (Signed)
Subjective:    Patient ID: Paul Rubio, male    DOB: 1951/01/24, 71 y.o.   MRN: [id number]  HPI  Patient is here today with his wife.  Fortunately, they have seen no progression of his memory loss.  He is tolerating the Aricept 5 mg daily without side effects such as nausea or vomiting or diarrhea.  He denies any trouble sleeping.  MRI revealed chronic microvascular changes as well as a remote lacunar infarct.  Given the stability of his memory loss over the last 3 to 4 months as well as the MRI findings, I suspect that the patient may have vascular dementia rather than Alzheimer's dementia.  We spent the majority of today's visit discussing this and going over treatment options  Past Medical History:  Diagnosis Date   Coronary artery disease 2011    2 stents placed   H/O hiatal hernia    Hyperlipidemia    borderline; status post stress test x2, the last one 5-6 years ago and without significant abnormality per the patient   Hypertension    for approximately 5 years   Myocardial infarction [facility]) 2011   Pre-diabetes    Prediabetes    Past Surgical History:  Procedure Laterality Date   CHOLECYSTECTOMY     COLONOSCOPY     CORONARY ANGIOPLASTY     DES to mid and distal RCA 05/15/10   REVERSE SHOULDER ARTHROPLASTY Right 12/06/2020   Procedure: RIGHT REVERSE SHOULDER ARTHROPLASTY;  Surgeon: Cammy Copa, MD;  Location: [facility] OR;  Service: Orthopedics;  Laterality: Right;   Right shoulder rotator cuff arthropathy.  12/06/2020   ROTATOR CUFF REPAIR Left 10/20/2013   Dr August Saucer   SHOULDER ARTHROSCOPY WITH OPEN ROTATOR CUFF REPAIR Left 10/20/2013   Procedure: SHOULDER ARTHROSCOPY WITH OPEN ROTATOR CUFF REPAIR;  Surgeon: Cammy Copa, MD;  Location: [facility] OR;  Service: Orthopedics;  Laterality: Left;  Left shoulder arthroscopy, debridement, open biceps tenodesis and rotator cuff repair.   Current Outpatient Medications on File Prior to Visit  Medication Sig Dispense Refill    amoxicillin (AMOXIL) 500 MG tablet Take 2 g 1 hr prior to dental procedure 10 tablet 0   APPLE CIDER VINEGAR PO Take 1,200 mg by mouth daily.     ascorbic acid (VITAMIN C) 500 MG tablet Take 500 mg by mouth daily.     aspirin EC 81 MG tablet Take 81 mg by mouth daily.     atorvastatin (LIPITOR) 40 MG tablet Take 1 tablet (40 mg total) by mouth daily. 90 tablet 3   cetirizine (ZYRTEC) 10 MG tablet Take 10 mg by mouth at bedtime.     cholecalciferol (VITAMIN D) 25 MCG (1000 UNIT) tablet Take 1,000 Units by mouth in the morning and at bedtime.     ibuprofen (ADVIL) 200 MG tablet Take 400 mg by mouth every 8 (eight) hours as needed (pain.).     losartan (COZAAR) 50 MG tablet Take 1 tablet (50 mg total) by mouth daily. TAKE 1 TABLET(50 MG) BY MOUTH DAILY Strength: 50 mg 90 tablet 3   metFORMIN (GLUCOPHAGE) 850 MG tablet TAKE 1 TABLET(850 MG) BY MOUTH TWICE DAILY WITH A MEAL 180 tablet 2   metoprolol tartrate (LOPRESSOR) 25 MG tablet Take 0.5 tablets (12.5 mg total) by mouth 2 (two) times daily. 90 tablet 3   nitroGLYCERIN (NITROSTAT) 0.4 MG SL tablet Place 1 tablet (0.4 mg total) under the tongue every 5 (five) minutes as needed for chest pain. 25 tablet 5  oxyCODONE (OXY IR/ROXICODONE) 5 MG immediate release tablet Take 1-2 tablets (5-10 mg total) by mouth every 4 (four) hours as needed for moderate pain (pain score 4-6). 30 tablet 0   Zinc 50 MG TABS Take 50 mg by mouth daily.     No current facility-administered medications on file prior to visit.   No Known Allergies Social History   Socioeconomic History   Marital status: Married    Spouse name: Not on file   Number of children: 2   Years of education: Not on file   Highest education level: Not on file  Occupational History   Not on file  Tobacco Use   Smoking status: Former    Types: Cigarettes    Quit date: 12/03/1968    Years since quitting: 53.6   Smokeless tobacco: Never  Vaping Use   Vaping Use: Never used  Substance and  Sexual Activity   Alcohol use: No   Drug use: No   Sexual activity: Not on file  Other Topics Concern   Not on file  Social History Narrative   Quit tobacco greater than 20 years ago with approximately 10 pack-year history. Denies alcohol or drug abuse   Social Determinants of Radio broadcast assistant Strain: Not on file  Food Insecurity: Not on file  Transportation Needs: Not on file  Physical Activity: Not on file  Stress: Not on file  Social Connections: Not on file  Intimate Partner Violence: Not on file   Family History  Problem Relation Age of Onset   Heart disease Father    Heart attack Other    Sudden death Paternal Uncle        possible MI   Colon cancer Neg Hx      Review of Systems  All other systems reviewed and are negative.      Objective:   Physical Exam Vitals reviewed.  Constitutional:      General: He is not in acute distress.    Appearance: He is well-developed. He is not diaphoretic.  HENT:     Head: Normocephalic and atraumatic.     Right Ear: External ear normal.     Left Ear: External ear normal.     Nose: Nose normal.     Mouth/Throat:     Pharynx: No oropharyngeal exudate.  Eyes:     General: No scleral icterus.       Right eye: No discharge.        Left eye: No discharge.     Conjunctiva/sclera: Conjunctivae normal.     Pupils: Pupils are equal, round, and reactive to light.  Neck:     Thyroid: No thyromegaly.     Vascular: No JVD.     Trachea: No tracheal deviation.  Cardiovascular:     Rate and Rhythm: Normal rate and regular rhythm.     Heart sounds: Normal heart sounds. No murmur heard.    No friction rub. No gallop.  Pulmonary:     Effort: Pulmonary effort is normal. No respiratory distress.     Breath sounds: Normal breath sounds. No stridor. No wheezing or rales.  Chest:     Chest wall: No tenderness.  Abdominal:     General: Bowel sounds are normal. There is no distension.     Palpations: Abdomen is soft. There  is no mass.     Tenderness: There is no abdominal tenderness. There is no guarding or rebound.  Musculoskeletal:  General: No tenderness. Normal range of motion.     Cervical back: Normal range of motion and neck supple.  Lymphadenopathy:     Cervical: No cervical adenopathy.  Skin:    General: Skin is warm.     Findings: Rash present.  Neurological:     Mental Status: He is alert and oriented to person, place, and time.     Cranial Nerves: No cranial nerve deficit.     Motor: No abnormal muscle tone.     Coordination: Coordination normal.     Deep Tendon Reflexes: Reflexes are normal and symmetric.  Psychiatric:        Behavior: Behavior normal.        Thought Content: Thought content normal.        Judgment: Judgment normal.           Assessment & Plan:  Memory loss At this point, the patient has mild cognitive decline.  Thankfully it is stable.  Based on his MRI and his past medical history I suspect that he has vascular dementia.  We have decided to Increase the Aricept to 10 mg a day and then monitor the patient clinically over the next several months for any progression.  If the patient remains stable we will not add Namenda

## 2022-07-01 ENCOUNTER — Encounter: Payer: Self-pay | Admitting: Family Medicine

## 2022-09-28 ENCOUNTER — Ambulatory Visit (INDEPENDENT_AMBULATORY_CARE_PROVIDER_SITE_OTHER): Payer: Medicare Other | Admitting: Family Medicine

## 2022-09-28 ENCOUNTER — Encounter: Payer: Self-pay | Admitting: Family Medicine

## 2022-09-28 VITALS — BP 124/72 | HR 68 | Resp 98 | Ht 70.5 in | Wt 195.0 lb

## 2022-09-28 DIAGNOSIS — E119 Type 2 diabetes mellitus without complications: Secondary | ICD-10-CM | POA: Diagnosis not present

## 2022-09-28 DIAGNOSIS — E7800 Pure hypercholesterolemia, unspecified: Secondary | ICD-10-CM | POA: Diagnosis not present

## 2022-09-28 DIAGNOSIS — Z23 Encounter for immunization: Secondary | ICD-10-CM | POA: Diagnosis not present

## 2022-09-28 MED ORDER — DONEPEZIL HCL 5 MG PO TABS: 5.0000 mg | ORAL_TABLET | Freq: Every day | ORAL | 3 refills | Status: DC

## 2022-09-28 NOTE — Progress Notes (Signed)
Subjective:    Patient ID: Paul Rubio, male    DOB: August 10, 1951, 71 y.o.   MRN: [id number]  HPI Patient is here today to follow-up his diabetes.  He increased his Aricept to 10 mg a day but they noticed significant diarrhea so they reduced it back to 5 mg a day and the diarrhea went away.  He seems to be tolerating the 5 mg without side effects.  His blood pressure today is outstanding at 124/72.  He is here today primarily to check his hemoglobin A1c.  His last A1c was 6 months ago and was excellent at 6.6.  He denies any polyuria polydipsia or blurry vision.  He would like the flu shot today.  Also recommended a COVID booster Past Medical History:  Diagnosis Date   Coronary artery disease 2011    2 stents placed   H/O hiatal hernia    Hyperlipidemia    borderline; status post stress test x2, the last one 5-6 years ago and without significant abnormality per the patient   Hypertension    for approximately 5 years   Myocardial infarction [facility]) 2011   Pre-diabetes    Prediabetes    Past Surgical History:  Procedure Laterality Date   CHOLECYSTECTOMY     COLONOSCOPY     CORONARY ANGIOPLASTY     DES to mid and distal RCA 05/15/10   REVERSE SHOULDER ARTHROPLASTY Right 12/06/2020   Procedure: RIGHT REVERSE SHOULDER ARTHROPLASTY;  Surgeon: Cammy Copa, MD;  Location: [facility] OR;  Service: Orthopedics;  Laterality: Right;   Right shoulder rotator cuff arthropathy.  12/06/2020   ROTATOR CUFF REPAIR Left 10/20/2013   Dr August Saucer   SHOULDER ARTHROSCOPY WITH OPEN ROTATOR CUFF REPAIR Left 10/20/2013   Procedure: SHOULDER ARTHROSCOPY WITH OPEN ROTATOR CUFF REPAIR;  Surgeon: Cammy Copa, MD;  Location: [facility] OR;  Service: Orthopedics;  Laterality: Left;  Left shoulder arthroscopy, debridement, open biceps tenodesis and rotator cuff repair.   Current Outpatient Medications on File Prior to Visit  Medication Sig Dispense Refill   amoxicillin (AMOXIL) 500 MG tablet Take 2 g 1 hr prior to  dental procedure 10 tablet 0   APPLE CIDER VINEGAR PO Take 1,200 mg by mouth daily.     ascorbic acid (VITAMIN C) 500 MG tablet Take 500 mg by mouth daily.     aspirin EC 81 MG tablet Take 81 mg by mouth daily.     atorvastatin (LIPITOR) 40 MG tablet Take 1 tablet (40 mg total) by mouth daily. 90 tablet 3   cetirizine (ZYRTEC) 10 MG tablet Take 10 mg by mouth at bedtime.     cholecalciferol (VITAMIN D) 25 MCG (1000 UNIT) tablet Take 1,000 Units by mouth in the morning and at bedtime.     ibuprofen (ADVIL) 200 MG tablet Take 400 mg by mouth every 8 (eight) hours as needed (pain.).     losartan (COZAAR) 50 MG tablet Take 1 tablet (50 mg total) by mouth daily. TAKE 1 TABLET(50 MG) BY MOUTH DAILY Strength: 50 mg 90 tablet 3   metFORMIN (GLUCOPHAGE) 850 MG tablet TAKE 1 TABLET(850 MG) BY MOUTH TWICE DAILY WITH A MEAL 180 tablet 2   metoprolol tartrate (LOPRESSOR) 25 MG tablet Take 0.5 tablets (12.5 mg total) by mouth 2 (two) times daily. 90 tablet 3   nitroGLYCERIN (NITROSTAT) 0.4 MG SL tablet Place 1 tablet (0.4 mg total) under the tongue every 5 (five) minutes as needed for chest pain. 25 tablet 5  oxyCODONE (OXY IR/ROXICODONE) 5 MG immediate release tablet Take 1-2 tablets (5-10 mg total) by mouth every 4 (four) hours as needed for moderate pain (pain score 4-6). 30 tablet 0   Zinc 50 MG TABS Take 50 mg by mouth daily.     No current facility-administered medications on file prior to visit.   No Known Allergies Social History   Socioeconomic History   Marital status: Married    Spouse name: Not on file   Number of children: 2   Years of education: Not on file   Highest education level: Not on file  Occupational History   Not on file  Tobacco Use   Smoking status: Former    Types: Cigarettes    Quit date: 12/03/1968    Years since quitting: 53.8   Smokeless tobacco: Never  Vaping Use   Vaping Use: Never used  Substance and Sexual Activity   Alcohol use: No   Drug use: No   Sexual  activity: Not on file  Other Topics Concern   Not on file  Social History Narrative   Quit tobacco greater than 20 years ago with approximately 10 pack-year history. Denies alcohol or drug abuse   Social Determinants of Radio broadcast assistant Strain: Not on file  Food Insecurity: Not on file  Transportation Needs: Not on file  Physical Activity: Not on file  Stress: Not on file  Social Connections: Not on file  Intimate Partner Violence: Not on file   Family History  Problem Relation Age of Onset   Heart disease Father    Heart attack Other    Sudden death Paternal Uncle        possible MI   Colon cancer Neg Hx      Review of Systems  All other systems reviewed and are negative.      Objective:   Physical Exam Vitals reviewed.  Constitutional:      General: He is not in acute distress.    Appearance: He is well-developed. He is not diaphoretic.  HENT:     Head: Normocephalic and atraumatic.     Right Ear: External ear normal.     Left Ear: External ear normal.     Nose: Nose normal.     Mouth/Throat:     Pharynx: No oropharyngeal exudate.  Eyes:     General: No scleral icterus.       Right eye: No discharge.        Left eye: No discharge.     Conjunctiva/sclera: Conjunctivae normal.     Pupils: Pupils are equal, round, and reactive to light.  Neck:     Thyroid: No thyromegaly.     Vascular: No JVD.     Trachea: No tracheal deviation.  Cardiovascular:     Rate and Rhythm: Normal rate and regular rhythm.     Heart sounds: Normal heart sounds. No murmur heard.    No friction rub. No gallop.  Pulmonary:     Effort: Pulmonary effort is normal. No respiratory distress.     Breath sounds: Normal breath sounds. No stridor. No wheezing or rales.  Chest:     Chest wall: No tenderness.  Abdominal:     General: Bowel sounds are normal. There is no distension.     Palpations: Abdomen is soft. There is no mass.     Tenderness: There is no abdominal tenderness.  There is no guarding or rebound.  Musculoskeletal:  General: No tenderness. Normal range of motion.     Cervical back: Normal range of motion and neck supple.  Lymphadenopathy:     Cervical: No cervical adenopathy.  Skin:    General: Skin is warm.     Findings: Rash present.  Neurological:     Mental Status: He is alert and oriented to person, place, and time.     Cranial Nerves: No cranial nerve deficit.     Motor: No abnormal muscle tone.     Coordination: Coordination normal.     Deep Tendon Reflexes: Reflexes are normal and symmetric.  Psychiatric:        Behavior: Behavior normal.        Thought Content: Thought content normal.        Judgment: Judgment normal.           Assessment & Plan:  Controlled type 2 diabetes mellitus without complication, without long-term current use of insulin (HCC) - Plan: CBC with Differential/Platelet, COMPLETE METABOLIC PANEL WITH GFR, Lipid panel, Hemoglobin A1c, Protein / Creatinine Ratio, Urine  Pure hypercholesterolemia Continue Aricept 5 mg a day.  The patient received his flu shot.  I recommended a COVID booster.  If his memory loss progresses I would recommend adding Namenda.

## 2022-09-28 NOTE — Addendum Note (Signed)
Addended by: Randal Buba K on: 09/28/2022 02:35 PM   Modules accepted: Orders

## 2022-09-29 LAB — CBC WITH DIFFERENTIAL/PLATELET
Absolute Monocytes: 660 cells/uL (ref 200–950)
Basophils Absolute: 28 cells/uL (ref 0–200)
Basophils Relative: 0.4 %
Eosinophils Absolute: 369 cells/uL (ref 15–500)
Eosinophils Relative: 5.2 %
HCT: 38.7 % (ref 38.5–50.0)
Hemoglobin: 13 g/dL — ABNORMAL LOW (ref 13.2–17.1)
Lymphs Abs: 2059 cells/uL (ref 850–3900)
MCH: 29.7 pg (ref 27.0–33.0)
MCHC: 33.6 g/dL (ref 32.0–36.0)
MCV: 88.6 fL (ref 80.0–100.0)
MPV: 11 fL (ref 7.5–12.5)
Monocytes Relative: 9.3 %
Neutro Abs: 3983 cells/uL (ref 1500–7800)
Neutrophils Relative %: 56.1 %
Platelets: 257 10*3/uL (ref 140–400)
RBC: 4.37 10*6/uL (ref 4.20–5.80)
RDW: 12.2 % (ref 11.0–15.0)
Total Lymphocyte: 29 %
WBC: 7.1 10*3/uL (ref 3.8–10.8)

## 2022-09-29 LAB — COMPLETE METABOLIC PANEL WITH GFR
AG Ratio: 1.6 (calc) (ref 1.0–2.5)
ALT: 23 U/L (ref 9–46)
AST: 20 U/L (ref 10–35)
Albumin: 4.2 g/dL (ref 3.6–5.1)
Alkaline phosphatase (APISO): 75 U/L (ref 35–144)
BUN: 24 mg/dL (ref 7–25)
CO2: 29 mmol/L (ref 20–32)
Calcium: 9.8 mg/dL (ref 8.6–10.3)
Chloride: 105 mmol/L (ref 98–110)
Creat: 1.01 mg/dL (ref 0.70–1.28)
Globulin: 2.7 g/dL (calc) (ref 1.9–3.7)
Glucose, Bld: 102 mg/dL — ABNORMAL HIGH (ref 65–99)
Potassium: 5.4 mmol/L — ABNORMAL HIGH (ref 3.5–5.3)
Sodium: 140 mmol/L (ref 135–146)
Total Bilirubin: 0.4 mg/dL (ref 0.2–1.2)
Total Protein: 6.9 g/dL (ref 6.1–8.1)
eGFR: 79 mL/min/{1.73_m2} (ref >=60)

## 2022-09-29 LAB — PROTEIN / CREATININE RATIO, URINE
Creatinine, Urine: 130 mg/dL (ref 20–320)
Protein/Creat Ratio: 138 mg/g creat (ref 25–148)
Protein/Creatinine Ratio: 0.138 mg/mg creat (ref 0.025–0.148)
Total Protein, Urine: 18 mg/dL (ref 5–25)

## 2022-09-29 LAB — LIPID PANEL
Cholesterol: 93 mg/dL (ref <200)
HDL: 43 mg/dL (ref >=40)
LDL Cholesterol (Calc): 36 mg/dL (calc)
Non-HDL Cholesterol (Calc): 50 mg/dL (calc) (ref <130)
Total CHOL/HDL Ratio: 2.2 (calc) (ref <5.0)
Triglycerides: 63 mg/dL (ref <150)

## 2022-09-29 LAB — HEMOGLOBIN A1C
Hgb A1c MFr Bld: 6.9 % of total Hgb — ABNORMAL HIGH (ref <5.7)
Mean Plasma Glucose: 151 mg/dL
eAG (mmol/L): 8.4 mmol/L

## 2022-10-03 DIAGNOSIS — D485 Neoplasm of uncertain behavior of skin: Secondary | ICD-10-CM | POA: Diagnosis not present

## 2022-10-03 DIAGNOSIS — L821 Other seborrheic keratosis: Secondary | ICD-10-CM | POA: Diagnosis not present

## 2022-10-03 DIAGNOSIS — Z85828 Personal history of other malignant neoplasm of skin: Secondary | ICD-10-CM | POA: Diagnosis not present

## 2022-10-03 DIAGNOSIS — D225 Melanocytic nevi of trunk: Secondary | ICD-10-CM | POA: Diagnosis not present

## 2022-10-03 DIAGNOSIS — C44519 Basal cell carcinoma of skin of other part of trunk: Secondary | ICD-10-CM | POA: Diagnosis not present

## 2022-10-16 ENCOUNTER — Other Ambulatory Visit (HOSPITAL_BASED_OUTPATIENT_CLINIC_OR_DEPARTMENT_OTHER): Payer: Self-pay | Admitting: Family

## 2022-10-16 ENCOUNTER — Other Ambulatory Visit (HOSPITAL_BASED_OUTPATIENT_CLINIC_OR_DEPARTMENT_OTHER): Payer: Self-pay | Admitting: Cardiovascular Disease

## 2022-10-16 DIAGNOSIS — I25118 Atherosclerotic heart disease of native coronary artery with other forms of angina pectoris: Secondary | ICD-10-CM

## 2022-10-16 NOTE — Telephone Encounter (Signed)
Rx(s) sent to pharmacy electronically.  

## 2022-10-16 NOTE — Telephone Encounter (Signed)
Pt also requesting refill of Metoprolol Tartrate 25 mg---12.5 mg BID. Received paper refill form [facility].

## 2022-10-16 NOTE — Telephone Encounter (Signed)
Patient of Dr. Cooper. Please review for refill. Thank you!  

## 2022-10-17 ENCOUNTER — Other Ambulatory Visit (HOSPITAL_BASED_OUTPATIENT_CLINIC_OR_DEPARTMENT_OTHER): Payer: Self-pay | Admitting: Cardiovascular Disease

## 2022-10-17 DIAGNOSIS — I25118 Atherosclerotic heart disease of native coronary artery with other forms of angina pectoris: Secondary | ICD-10-CM

## 2022-10-17 MED ORDER — METOPROLOL TARTRATE 25 MG PO TABS: 12.5000 mg | ORAL_TABLET | Freq: Two times a day (BID) | ORAL | 0 refills | Status: DC

## 2022-10-30 ENCOUNTER — Ambulatory Visit (HOSPITAL_BASED_OUTPATIENT_CLINIC_OR_DEPARTMENT_OTHER): Payer: Medicare Other | Admitting: Family

## 2022-10-30 ENCOUNTER — Encounter (HOSPITAL_BASED_OUTPATIENT_CLINIC_OR_DEPARTMENT_OTHER): Payer: Self-pay | Admitting: Family

## 2022-10-30 VITALS — BP 126/62 | HR 73 | Ht 70.5 in | Wt 210.0 lb

## 2022-10-30 DIAGNOSIS — I25118 Atherosclerotic heart disease of native coronary artery with other forms of angina pectoris: Secondary | ICD-10-CM | POA: Diagnosis not present

## 2022-10-30 DIAGNOSIS — E785 Hyperlipidemia, unspecified: Secondary | ICD-10-CM | POA: Diagnosis not present

## 2022-10-30 DIAGNOSIS — I10 Essential (primary) hypertension: Secondary | ICD-10-CM

## 2022-10-30 MED ORDER — ATORVASTATIN CALCIUM 40 MG PO TABS: 40.0000 mg | ORAL_TABLET | Freq: Every day | ORAL | 3 refills | Status: DC

## 2022-10-30 MED ORDER — NITROGLYCERIN 0.4 MG SL SUBL: 0.4000 mg | SUBLINGUAL_TABLET | SUBLINGUAL | 2 refills | Status: AC | PRN

## 2022-10-30 MED ORDER — METOPROLOL TARTRATE 25 MG PO TABS: 12.5000 mg | ORAL_TABLET | Freq: Two times a day (BID) | ORAL | 3 refills | Status: DC

## 2022-10-30 MED ORDER — LOSARTAN POTASSIUM 50 MG PO TABS: 50.0000 mg | ORAL_TABLET | Freq: Every day | ORAL | 3 refills | Status: DC

## 2022-10-30 NOTE — Progress Notes (Signed)
Office Visit    Patient Name: Paul Rubio Date of Encounter: 10/30/2022  PCP:  Donita Brooks, MD   [facility]  Cardiologist:  Tonny Bollman, MD  Advanced Practice Provider:  No care team member to display Electrophysiologist:  None   Chief Complaint    Paul Rubio is a 71 y.o. male with a hx of CAD s/p DES x 2 to mid and distal RCA in 2011, hypertension, hyperlipidemia, DM2 presents today for follow up of coronary disease   Past Medical History    Past Medical History:  Diagnosis Date   Coronary artery disease 2011    2 stents placed   H/O hiatal hernia    Hyperlipidemia    borderline; status post stress test x2, the last one 5-6 years ago and without significant abnormality per the patient   Hypertension    for approximately 5 years   Myocardial infarction [facility]) 2011   Pre-diabetes    Prediabetes    Past Surgical History:  Procedure Laterality Date   CHOLECYSTECTOMY     COLONOSCOPY     CORONARY ANGIOPLASTY     DES to mid and distal RCA 05/15/10   REVERSE SHOULDER ARTHROPLASTY Right 12/06/2020   Procedure: RIGHT REVERSE SHOULDER ARTHROPLASTY;  Surgeon: Cammy Copa, MD;  Location: [facility] OR;  Service: Orthopedics;  Laterality: Right;   Right shoulder rotator cuff arthropathy.  12/06/2020   ROTATOR CUFF REPAIR Left 10/20/2013   Dr August Saucer   SHOULDER ARTHROSCOPY WITH OPEN ROTATOR CUFF REPAIR Left 10/20/2013   Procedure: SHOULDER ARTHROSCOPY WITH OPEN ROTATOR CUFF REPAIR;  Surgeon: Cammy Copa, MD;  Location: [facility] OR;  Service: Orthopedics;  Laterality: Left;  Left shoulder arthroscopy, debridement, open biceps tenodesis and rotator cuff repair.    Allergies  No Known Allergies  History of Present Illness    Paul Rubio is a 71 y.o. male with a hx of CAD s/p DES x 2 to mid and distal RCA in 2011, mild cognitive decline,  hypertension, hyperlipidemia, DM2  last seen 10/31/21  He presented with NSTEMi 2011 and trested  with DES x 2 to mid and distal RCA. He was last seen 10/2020 and doing well from cardiac perspective. Since last seen underwent right reverse shoulder surgery 12/2020.   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 No changes were made.   Seen by PCP 06/29/22 with mild cognitive decline suspected to be vascular dementia. His Aricept was increased.   He presents today for follow up with his wife.  Doing well since last seen.  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  Eats predominantly at home and does not add much salt to his food. Reports no shortness of breath nor dyspnea on exertion. Reports no chest pain, pressure, or tightness. No edema, orthopnea, PND. Reports no palpitations.  He does note some fatigue which she attributes to high level of activities.  Wakes feeling well rested then around 4 or 5 PM will feel tired after working all day.  Reassurance provided.  EKGs/Labs/Other Studies Reviewed:   The following studies were reviewed today:   EKG:  EKG is  ordered today.  The ekg ordered today demonstrates NSR 73 bpm with no acute ST/T wave changes.   Recent Labs: 03/27/2022: TSH 1.57 09/28/2022: ALT 23; BUN 24; Creat 1.01; Hemoglobin 13.0; Platelets 257; Potassium 5.4; Sodium 140  Recent Lipid Panel  Component Value Date/Time   CHOL 93 09/28/2022 1149   TRIG 63 09/28/2022 1149   HDL 43 09/28/2022 1149   CHOLHDL 2.2 09/28/2022 1149   VLDL 13 12/28/2016 0830   LDLCALC 36 09/28/2022 1149    Home Medications   Current Meds  Medication Sig   amoxicillin (AMOXIL) 500 MG tablet Take 2 g 1 hr prior to dental procedure   APPLE CIDER VINEGAR PO Take 1,200 mg by mouth daily.   ascorbic acid (VITAMIN C) 500 MG tablet Take 500 mg by mouth daily.   aspirin EC 81 MG tablet Take 81 mg by mouth daily.   cetirizine (ZYRTEC) 10 MG tablet Take 10 mg by mouth at bedtime.   cholecalciferol (VITAMIN D) 25 MCG (1000 UNIT)  tablet Take 1,000 Units by mouth in the morning and at bedtime.   donepezil (ARICEPT) 5 MG tablet Take 1 tablet (5 mg total) by mouth at bedtime.   ibuprofen (ADVIL) 200 MG tablet Take 400 mg by mouth every 8 (eight) hours as needed (pain.).   metFORMIN (GLUCOPHAGE) 850 MG tablet TAKE 1 TABLET(850 MG) BY MOUTH TWICE DAILY WITH A MEAL   Zinc 50 MG TABS Take 50 mg by mouth daily.   [DISCONTINUED] atorvastatin (LIPITOR) 40 MG tablet TAKE 1 TABLET(40 MG) BY MOUTH DAILY   [DISCONTINUED] losartan (COZAAR) 50 MG tablet TAKE 1 TABLET BY MOUTH EVERY DAY   [DISCONTINUED] metoprolol tartrate (LOPRESSOR) 25 MG tablet Take 0.5 tablets (12.5 mg total) by mouth 2 (two) times daily.   [DISCONTINUED] nitroGLYCERIN (NITROSTAT) 0.4 MG SL tablet Place 1 tablet (0.4 mg total) under the tongue every 5 (five) minutes as needed for chest pain.     Review of Systems      All other systems reviewed and are otherwise negative except as noted above.  Physical Exam    VS:  BP 126/62   Pulse 73   Ht 5' 10.5" (1.791 m)   Wt 210 lb (95.3 kg)   BMI 29.71 kg/m  , BMI Body mass index is 29.71 kg/m.  Wt Readings from Last 3 Encounters:  10/30/22 210 lb (95.3 kg)  09/28/22 195 lb (88.5 kg)  06/29/22 205 lb (93 kg)    GEN: Well nourished, overweight, well developed, in no acute distress. HEENT: normal. Neck: Supple, no JVD, carotid bruits, or masses. Cardiac: RRR, no murmurs, rubs, or gallops. No clubbing, cyanosis, edema.  Radials/PT 2+ and equal bilaterally.  Respiratory:  Respirations regular and unlabored, clear to auscultation bilaterally. GI: Soft, nontender, nondistended. MS: No deformity or atrophy. Skin: Warm and dry, no rash. Neuro:  Strength and sensation are intact. Psych: Normal affect.  Assessment & Plan    CAD s/p DES x 2 to mid and distal RCA 2011 - Stable with no anginal symptoms. No indication for ischemic evaluation.  GDMT includes aspirin, metoprolol, atorvastatin, as needed nitroglycerin.  Refills provided. Heart healthy diet and regular cardiovascular exercise encouraged.    HTN - BP well controlled. Continue current antihypertensive regimen including metoprolol 12.5 mg twice daily, losartan 50 mg daily.  Refills provided.  HLD, LDL goal <70 - 09/2022 LDL 63  Cholesterol numbers at goal.  Continue atorvastatin 40 mg daily.  He does note some knee pain which is more consistent with musculoskeletal origin.  Low suspicion atorvastatin is contributory and reassurance provided.  DM2 -follows with PCP.  09/28/22 A1c 6.9. If additional agent needed consider addition of SGLT2 I for cardioprotective benefit.  Disposition: Follow up in 1 year(s) with  Tonny Bollman, MD or APP.  Signed, Alver Sorrow, NP 10/30/2022, 12:03 PM [facility]

## 2022-10-30 NOTE — Patient Instructions (Addendum)
Medication Instructions:  Continue your current medications.   *If you need a refill on your cardiac medications before your next appointment, please call your pharmacy*   Lab Work/Testing/Procedures: Your cholesterol numbers in October looked great!  Your EKG today showed normal sinus rhythm which is good.   Follow-Up: At [facility], you and your health needs are our priority.  As part of our continuing mission to provide you with exceptional heart care, we have created designated Provider Care Teams.  These Care Teams include your primary Cardiologist (physician) and Advanced Practice Providers (APPs -  Physician Assistants and Nurse Practitioners) who all work together to provide you with the care you need, when you need it.  We recommend signing up for the patient portal called "MyChart".  Sign up information is provided on this After Visit Summary.  MyChart is used to connect with patients for Virtual Visits (Telemedicine).  Patients are able to view lab/test results, encounter notes, upcoming appointments, etc.  Non-urgent messages can be sent to your provider as well.   To learn more about what you can do with MyChart, go to [email or web address].    Your next appointment:   1 year(s)  The format for your next appointment:   In Person  Provider:   Tonny Bollman, MD  or Alver Sorrow, NP    Other Instructions  Heart Healthy Diet Recommendations: A low-salt diet is recommended. Meats should be grilled, baked, or boiled. Avoid fried foods. Focus on lean protein sources like fish or chicken with vegetables and fruits. The American Heart Association is a Chief Technology Officer!  American Heart Association Diet and Lifeystyle Recommendations   Exercise recommendations: The American Heart Association recommends 150 minutes of moderate intensity exercise weekly. Try 30 minutes of moderate intensity exercise 4-5 times per week. This could include walking, jogging, or  swimming.   Important Information About Sugar

## 2022-12-11 ENCOUNTER — Encounter: Payer: Self-pay | Admitting: Family Medicine

## 2022-12-12 ENCOUNTER — Other Ambulatory Visit: Payer: Self-pay | Admitting: Family Medicine

## 2022-12-12 NOTE — Telephone Encounter (Signed)
Unable to refill per protocol, Rx request is too soon. Last refill 04/03/22 for 90 and 2 refills.  Requested Prescriptions  Pending Prescriptions Disp Refills   metFORMIN (GLUCOPHAGE) 850 MG tablet [Pharmacy Med Name: METFORMIN 850MG  TABLETS] 180 tablet 2    Sig: TAKE 1 TABLET(850 MG) BY MOUTH TWICE DAILY WITH A MEAL     Endocrinology:  Diabetes - Biguanides Failed - 12/12/2022  2:19 PM      Failed - Valid encounter within last 6 months    Recent Outpatient Visits           8 months ago General medical exam   [place] Paul Frizzle, MD   1 year ago General medical exam   Paul Paul Frizzle, MD   2 years ago Controlled type 2 diabetes mellitus without complication, without long-term current use of insulin ([place])   [place] Rubio, [name], MD   2 years ago Controlled type 2 diabetes mellitus without complication, without long-term current use of insulin ([place])   [place] Rubio, [name], MD   3 years ago URI, acute   [place] Rubio, [name], MD              Passed - Cr in normal range and within 360 days    Creat  Date Value Ref Range Status  09/28/2022 1.01 0.70 - 1.28 mg/dL Final   Creatinine, Urine  Date Value Ref Range Status  09/28/2022 130 20 - 320 mg/dL Final         Passed - HBA1C is between 0 and 7.9 and within 180 days    Hgb A1c MFr Bld  Date Value Ref Range Status  09/28/2022 6.9 (H) <5.7 % of total Hgb Final    Comment:    For someone without known diabetes, a hemoglobin A1c value of 6.5% or greater indicates that they may have  diabetes and this should be confirmed with a follow-up  test. . For someone with known diabetes, a value <7% indicates  that their diabetes is well controlled and a value  greater than or equal to 7% indicates suboptimal  control. A1c targets should be individualized based on  duration of diabetes, age, comorbid  conditions, and  other considerations. . Currently, no consensus exists regarding use of hemoglobin A1c for diagnosis of diabetes for children. .          Passed - eGFR in normal range and within 360 days    GFR, Est African American  Date Value Ref Range Status  01/06/2021 90 > OR = 60 mL/min/1.75m2 Final   GFR, Est Non African American  Date Value Ref Range Status  01/06/2021 77 > OR = 60 mL/min/1.29m2 Final   GFR  Date Value Ref Range Status  07/21/2013 73.89 >60.00 mL/min Final   eGFR  Date Value Ref Range Status  09/28/2022 79 > OR = 60 mL/min/1.27m2 Final         Passed - B12 Level in normal range and within 720 days    Vitamin B-12  Date Value Ref Range Status  03/27/2022 340 200 - 1,100 pg/mL Final    Comment:    . Please Note: Although the reference range for vitamin B12 is [phone] pg/mL, it has been reported that between 5 and 10% of patients with values between 200 and 400 pg/mL may experience neuropsychiatric and hematologic abnormalities due to occult B12 deficiency; less  than 1% of patients with values above 400 pg/mL will have symptoms. .          Passed - CBC within normal limits and completed in the last 12 months    WBC  Date Value Ref Range Status  09/28/2022 7.1 3.8 - 10.8 Thousand/uL Final   RBC  Date Value Ref Range Status  09/28/2022 4.37 4.20 - 5.80 Million/uL Final   Hemoglobin  Date Value Ref Range Status  09/28/2022 13.0 (L) 13.2 - 17.1 g/dL Final   HCT  Date Value Ref Range Status  09/28/2022 38.7 38.5 - 50.0 % Final   MCHC  Date Value Ref Range Status  09/28/2022 33.6 32.0 - 36.0 g/dL Final   [facility]  Date Value Ref Range Status  09/28/2022 29.7 27.0 - 33.0 pg Final   MCV  Date Value Ref Range Status  09/28/2022 88.6 80.0 - 100.0 fL Final   No results found for: "PLTCOUNTKUC", "LABPLAT", "POCPLA" RDW  Date Value Ref Range Status  09/28/2022 12.2 11.0 - 15.0 % Final

## 2022-12-14 ENCOUNTER — Other Ambulatory Visit: Payer: Self-pay | Admitting: Family Medicine

## 2022-12-14 ENCOUNTER — Other Ambulatory Visit: Payer: Medicare Other

## 2022-12-14 DIAGNOSIS — T781XXA Other adverse food reactions, not elsewhere classified, initial encounter: Secondary | ICD-10-CM | POA: Diagnosis not present

## 2022-12-14 DIAGNOSIS — W57XXXD Bitten or stung by nonvenomous insect and other nonvenomous arthropods, subsequent encounter: Secondary | ICD-10-CM

## 2022-12-14 DIAGNOSIS — Z91014 Allergy to mammalian meats: Secondary | ICD-10-CM

## 2022-12-17 LAB — ALPHA-GAL PANEL
Allergen, Mutton, f88: <0.1 kU/L
Allergen, Pork, f26: <0.1 kU/L
Beef: <0.1 kU/L
CLASS: 0
CLASS: 0
Class: 0
GALACTOSE-ALPHA-1,3-GALACTOSE IGE*: <0.1 kU/L (ref <0.10)

## 2022-12-17 LAB — INTERPRETATION:

## 2023-01-06 ENCOUNTER — Other Ambulatory Visit: Payer: Self-pay | Admitting: Family Medicine

## 2023-01-07 ENCOUNTER — Other Ambulatory Visit: Payer: Self-pay | Admitting: Family Medicine

## 2023-02-04 ENCOUNTER — Other Ambulatory Visit: Payer: Self-pay | Admitting: Family Medicine

## 2023-02-13 DIAGNOSIS — K080 Exfoliation of teeth due to systemic causes: Secondary | ICD-10-CM | POA: Diagnosis not present

## 2023-02-20 DIAGNOSIS — H5203 Hypermetropia, bilateral: Secondary | ICD-10-CM | POA: Diagnosis not present

## 2023-03-03 ENCOUNTER — Other Ambulatory Visit: Payer: Self-pay | Admitting: Family Medicine

## 2023-03-04 NOTE — Telephone Encounter (Signed)
Requested Prescriptions  Pending Prescriptions Disp Refills   metFORMIN (GLUCOPHAGE) 850 MG tablet [Pharmacy Med Name: METFORMIN 850MG  TABLETS] 180 tablet 0    Sig: TAKE 1 TABLET(850 MG) BY MOUTH TWICE DAILY WITH A MEAL     Endocrinology:  Diabetes - Biguanides Failed - 03/03/2023  6:16 AM      Failed - Valid encounter within last 6 months    Recent Outpatient Visits           11 months ago General medical exam   [place] Susy Frizzle, MD   2 years ago General medical exam   [place] Susy Frizzle, MD   2 years ago Controlled type 2 diabetes mellitus without complication, without long-term current use of insulin ([place])   [place] [name], [name], MD   3 years ago Controlled type 2 diabetes mellitus without complication, without long-term current use of insulin ([place])   [place] [name], [name], MD   4 years ago URI, acute   [place] [name], [name], MD              Passed - Cr in normal range and within 360 days    Creat  Date Value Ref Range Status  09/28/2022 1.01 0.70 - 1.28 mg/dL Final   Creatinine, Urine  Date Value Ref Range Status  09/28/2022 130 20 - 320 mg/dL Final         Passed - HBA1C is between 0 and 7.9 and within 180 days    Hgb A1c MFr Bld  Date Value Ref Range Status  09/28/2022 6.9 (H) <5.7 % of total Hgb Final    Comment:    For someone without known diabetes, a hemoglobin A1c value of 6.5% or greater indicates that they may have  diabetes and this should be confirmed with a follow-up  test. . For someone with known diabetes, a value <7% indicates  that their diabetes is well controlled and a value  greater than or equal to 7% indicates suboptimal  control. A1c targets should be individualized based on  duration of diabetes, age, comorbid conditions, and  other considerations. . Currently, no consensus exists regarding use  of hemoglobin A1c for diagnosis of diabetes for children. .          Passed - eGFR in normal range and within 360 days    GFR, Est African American  Date Value Ref Range Status  01/06/2021 90 > OR = 60 mL/min/1.47m2 Final   GFR, Est Non African American  Date Value Ref Range Status  01/06/2021 77 > OR = 60 mL/min/1.62m2 Final   GFR  Date Value Ref Range Status  07/21/2013 73.89 >60.00 mL/min Final   eGFR  Date Value Ref Range Status  09/28/2022 79 > OR = 60 mL/min/1.71m2 Final         Passed - B12 Level in normal range and within 720 days    Vitamin B-12  Date Value Ref Range Status  03/27/2022 340 200 - 1,100 pg/mL Final    Comment:    . Please Note: Although the reference range for vitamin B12 is [phone] pg/mL, it has been reported that between 5 and 10% of patients with values between 200 and 400 pg/mL may experience neuropsychiatric and hematologic abnormalities due to occult B12 deficiency; less than 1% of patients with values above 400 pg/mL will have symptoms. Marland Kitchen  Passed - CBC within normal limits and completed in the last 12 months    WBC  Date Value Ref Range Status  09/28/2022 7.1 3.8 - 10.8 Thousand/uL Final   RBC  Date Value Ref Range Status  09/28/2022 4.37 4.20 - 5.80 Million/uL Final   Hemoglobin  Date Value Ref Range Status  09/28/2022 13.0 (L) 13.2 - 17.1 g/dL Final   HCT  Date Value Ref Range Status  09/28/2022 38.7 38.5 - 50.0 % Final   MCHC  Date Value Ref Range Status  09/28/2022 33.6 32.0 - 36.0 g/dL Final   [facility]  Date Value Ref Range Status  09/28/2022 29.7 27.0 - 33.0 pg Final   MCV  Date Value Ref Range Status  09/28/2022 88.6 80.0 - 100.0 fL Final   No results found for: "PLTCOUNTKUC", "LABPLAT", "POCPLA" RDW  Date Value Ref Range Status  09/28/2022 12.2 11.0 - 15.0 % Final

## 2023-03-26 ENCOUNTER — Other Ambulatory Visit: Payer: Medicare Other

## 2023-03-26 DIAGNOSIS — Z125 Encounter for screening for malignant neoplasm of prostate: Secondary | ICD-10-CM | POA: Diagnosis not present

## 2023-03-26 DIAGNOSIS — E782 Mixed hyperlipidemia: Secondary | ICD-10-CM | POA: Diagnosis not present

## 2023-03-26 DIAGNOSIS — I10 Essential (primary) hypertension: Secondary | ICD-10-CM | POA: Diagnosis not present

## 2023-03-26 DIAGNOSIS — E118 Type 2 diabetes mellitus with unspecified complications: Secondary | ICD-10-CM

## 2023-03-26 LAB — CBC WITH DIFFERENTIAL/PLATELET
HCT: 38.6 % (ref 38.5–50.0)
Lymphs Abs: 2117 cells/uL (ref 850–3900)
MCH: 29.4 pg (ref 27.0–33.0)
Neutrophils Relative %: 57.2 %
RDW: 12.8 % (ref 11.0–15.0)
Total Lymphocyte: 31.6 %

## 2023-03-27 LAB — CBC WITH DIFFERENTIAL/PLATELET
MCV: 87.3 fL (ref 80.0–100.0)
MPV: 10.9 fL (ref 7.5–12.5)

## 2023-03-27 LAB — MICROALBUMIN / CREATININE URINE RATIO
Creatinine, Urine: 48 mg/dL (ref 20–320)
Microalb Creat Ratio: 44 mg/g creat — ABNORMAL HIGH (ref <30)
Microalb, Ur: 2.1 mg/dL

## 2023-03-27 LAB — LIPID PANEL
Non-HDL Cholesterol (Calc): 45 mg/dL (calc) (ref <130)
Total CHOL/HDL Ratio: 2 (calc) (ref <5.0)

## 2023-03-27 LAB — COMPLETE METABOLIC PANEL WITH GFR
Albumin: 4.2 g/dL (ref 3.6–5.1)
Calcium: 9.4 mg/dL (ref 8.6–10.3)
Creat: 1.06 mg/dL (ref 0.70–1.28)
Total Bilirubin: 0.4 mg/dL (ref 0.2–1.2)
Total Protein: 6.8 g/dL (ref 6.1–8.1)
eGFR: 74 mL/min/{1.73_m2} (ref >=60)

## 2023-03-27 LAB — HEMOGLOBIN A1C: eAG (mmol/L): 8.5 mmol/L

## 2023-04-01 ENCOUNTER — Encounter: Payer: Self-pay | Admitting: Family Medicine

## 2023-04-01 ENCOUNTER — Ambulatory Visit (INDEPENDENT_AMBULATORY_CARE_PROVIDER_SITE_OTHER): Payer: Medicare Other | Admitting: Family Medicine

## 2023-04-01 VITALS — BP 120/72 | HR 83 | Temp 98.8°F | Ht 70.5 in | Wt 208.8 lb

## 2023-04-01 DIAGNOSIS — Z0001 Encounter for general adult medical examination with abnormal findings: Secondary | ICD-10-CM | POA: Diagnosis not present

## 2023-04-01 DIAGNOSIS — Z0000 Encounter for general adult medical examination without abnormal findings: Secondary | ICD-10-CM

## 2023-04-01 DIAGNOSIS — E7800 Pure hypercholesterolemia, unspecified: Secondary | ICD-10-CM | POA: Diagnosis not present

## 2023-04-01 DIAGNOSIS — I10 Essential (primary) hypertension: Secondary | ICD-10-CM

## 2023-04-01 DIAGNOSIS — Z7984 Long term (current) use of oral hypoglycemic drugs: Secondary | ICD-10-CM

## 2023-04-01 DIAGNOSIS — E119 Type 2 diabetes mellitus without complications: Secondary | ICD-10-CM

## 2023-04-01 DIAGNOSIS — R413 Other amnesia: Secondary | ICD-10-CM

## 2023-04-01 DIAGNOSIS — I2510 Atherosclerotic heart disease of native coronary artery without angina pectoris: Secondary | ICD-10-CM

## 2023-04-01 LAB — COMPLETE METABOLIC PANEL WITH GFR
AG Ratio: 1.6 (calc) (ref 1.0–2.5)
ALT: 19 U/L (ref 9–46)
AST: 17 U/L (ref 10–35)
Alkaline phosphatase (APISO): 67 U/L (ref 35–144)
BUN: 21 mg/dL (ref 7–25)
CO2: 26 mmol/L (ref 20–32)
Chloride: 105 mmol/L (ref 98–110)
Globulin: 2.6 g/dL (calc) (ref 1.9–3.7)
Glucose, Bld: 130 mg/dL — ABNORMAL HIGH (ref 65–99)
Potassium: 5 mmol/L (ref 3.5–5.3)
Sodium: 139 mmol/L (ref 135–146)

## 2023-04-01 LAB — CBC WITH DIFFERENTIAL/PLATELET
Absolute Monocytes: 590 cells/uL (ref 200–950)
Basophils Absolute: 20 cells/uL (ref 0–200)
Basophils Relative: 0.3 %
Eosinophils Absolute: 141 cells/uL (ref 15–500)
Eosinophils Relative: 2.1 %
Hemoglobin: 13 g/dL — ABNORMAL LOW (ref 13.2–17.1)
MCHC: 33.7 g/dL (ref 32.0–36.0)
Monocytes Relative: 8.8 %
Neutro Abs: 3832 cells/uL (ref 1500–7800)
Platelets: 253 10*3/uL (ref 140–400)
RBC: 4.42 10*6/uL (ref 4.20–5.80)
WBC: 6.7 10*3/uL (ref 3.8–10.8)

## 2023-04-01 LAB — HEMOGLOBIN A1C
Hgb A1c MFr Bld: 7 % of total Hgb — ABNORMAL HIGH (ref <5.7)
Mean Plasma Glucose: 154 mg/dL

## 2023-04-01 LAB — TEST AUTHORIZATION

## 2023-04-01 LAB — VITAMIN B12: Vitamin B-12: 338 pg/mL (ref 200–1100)

## 2023-04-01 LAB — PSA: PSA: 1.77 ng/mL (ref <=4.00)

## 2023-04-01 LAB — LIPID PANEL
Cholesterol: 89 mg/dL (ref <200)
HDL: 44 mg/dL (ref >=40)
LDL Cholesterol (Calc): 33 mg/dL (calc)
Triglycerides: 42 mg/dL (ref <150)

## 2023-04-01 MED ORDER — ROSUVASTATIN CALCIUM 10 MG PO TABS
10.0000 mg | ORAL_TABLET | Freq: Every day | ORAL | 3 refills | Status: DC
Start: 2023-04-01 — End: 2024-03-31

## 2023-04-01 NOTE — Progress Notes (Signed)
Subjective:    Patient ID: Paul Rubio, male    DOB: [birth year], 72 y.o.   MRN: [id number]  HPI Patient is a very pleasant 72 year old Caucasian gentleman here today with his wife for physical exam.  Over the last couple years, we have been discussing memory loss.  Has had some mild age-related memory loss.  Please see my office visits from last year for further discussion.  He is currently on Aricept 5 mg a day.  Thankfully his wife and the patient both state that they have not seen any significant change in his memory since I last saw him.  Seems to be doing stable.  However his wife does endorse that he loses his temper more easily.  His daughter and his son have also seen that is a personality change.  The patient denies any depression or anxiety.  There are no hallucinations.  He is not sundowning.  However it does appear that he may be losing some of his higher executive functioning as far as being able to control his temper.  His last colonoscopy was in 2019 and is not due again until 2028.  He is due for a prostate exam today.  Immunizations are up-to-date except for COVID-vaccine. Immunization History  Administered Date(s) Administered   Fluad Quad(high Dose 65+) 08/27/2019, 09/14/2020, 10/31/2021, 09/28/2022   Influenza, High Dose Seasonal PF 09/12/2018   Influenza,inj,Quad PF,6+ Mos 10/21/2013, 09/28/2014, 12/27/2015, 09/27/2016, 10/01/2017   PFIZER(Purple Top)SARS-COV-2 Vaccination 01/25/2020, 02/15/2020, 09/04/2020   Pfizer Covid-19 Vaccine Bivalent Booster 37yrs & up 12/24/2021   Pneumococcal Conjugate-13 12/27/2015   Pneumococcal Polysaccharide-23 12/17/2014   Td 11/21/2012   Tdap 11/21/2012   Zoster Recombinat (Shingrix) 09/06/2019, 11/15/2019   Lab on 03/26/2023  Component Date Value Ref Range Status   Vitamin B-12 03/26/2023 338  200 - 1,100 pg/mL Final   Comment: . Please Note: Although the reference range for vitamin B12 is [phone] pg/mL, it has been reported that  between 5 and 10% of patients with values between 200 and 400 pg/mL may experience neuropsychiatric and hematologic abnormalities due to occult B12 deficiency; less than 1% of patients with values above 400 pg/mL will have symptoms. .    Creatinine, Urine 03/26/2023 48  20 - 320 mg/dL Final   Microalb, Ur 40/98/1191 2.1  mg/dL Final   Comment: Reference Range Not established    Microalb Creat Ratio 03/26/2023 44 (H)  <30 mg/g creat Final   Comment: . The ADA defines abnormalities in albumin excretion as follows: Marland Kitchen Albuminuria Category        Result (mg/g creatinine) . Normal to Mildly increased   <30 Moderately increased         30-299  Severely increased           > OR = 300 . The ADA recommends that at least two of three specimens collected within a 3-6 month period be abnormal before considering a patient to be within a diagnostic category.    Cholesterol 03/26/2023 89  <200 mg/dL Final   HDL 47/82/9562 44  > OR = 40 mg/dL Final   Triglycerides 13/07/6577 42  <150 mg/dL Final   LDL Cholesterol (Calc) 03/26/2023 33  mg/dL (calc) Final   Comment: Reference range: <100 . Desirable range <100 mg/dL for primary prevention;   <70 mg/dL for patients with CHD or diabetic patients  with > or = 2 CHD risk factors. Marland Kitchen LDL-C is now calculated using the Martin-Hopkins  calculation, which is a validated novel  method providing  better accuracy than the Friedewald equation in the  estimation of LDL-C.  Horald Pollen et al. Lenox Ahr. 1610;960(45): 2061-2068  (http://education.[facility].com/faq/FAQ164)    Total CHOL/HDL Ratio 03/26/2023 2.0  <4.0 (calc) Final   Non-HDL Cholesterol (Calc) 03/26/2023 45  <130 mg/dL (calc) Final   Comment: For patients with diabetes plus 1 major ASCVD risk  factor, treating to a non-HDL-C goal of <100 mg/dL  (LDL-C of <98 mg/dL) is considered a therapeutic  option.    Hgb A1c MFr Bld 03/26/2023 7.0 (H)  <5.7 % of total Hgb Final   Comment: For  someone without known diabetes, a hemoglobin A1c value of 6.5% or greater indicates that they may have  diabetes and this should be confirmed with a follow-up  test. . For someone with known diabetes, a value <7% indicates  that their diabetes is well controlled and a value  greater than or equal to 7% indicates suboptimal  control. A1c targets should be individualized based on  duration of diabetes, age, comorbid conditions, and  other considerations. . Currently, no consensus exists regarding use of hemoglobin A1c for diagnosis of diabetes for children. .    Mean Plasma Glucose 03/26/2023 154  mg/dL Final   eAG (mmol/L) 11/91/4782 8.5  mmol/L Final   Comment: . This test was performed on the Roche cobas c503 platform. Effective 09/10/22, a change in test platforms from the Abbott Architect to the Roche cobas c503 may have shifted HbA1c results compared to historical results. Based on laboratory validation testing conducted at [facility], the Roche platform relative to the Abbott platform had an average increase in HbA1c value of < or = 0.3%. This difference is within accepted  variability established by the [facility]. Note that not all individuals will have had a shift in their results and direct comparisons between historical and current results for testing conducted on different platforms is not recommended.    Glucose, Bld 03/26/2023 130 (H)  65 - 99 mg/dL Final   Comment: .            Fasting reference interval . For someone without known diabetes, a glucose value >125 mg/dL indicates that they may have diabetes and this should be confirmed with a follow-up test. .    BUN 03/26/2023 21  7 - 25 mg/dL Final   Creat 95/62/1308 1.06  0.70 - 1.28 mg/dL Final   eGFR 65/78/4696 74  > OR = 60 mL/min/1.74m2 Final   BUN/Creatinine Ratio 03/26/2023 SEE NOTE:  6 - 22 (calc) Final   Comment:    Not Reported: BUN and Creatinine are within     reference range. .    Sodium 03/26/2023 139  135 - 146 mmol/L Final   Potassium 03/26/2023 5.0  3.5 - 5.3 mmol/L Final   Chloride 03/26/2023 105  98 - 110 mmol/L Final   CO2 03/26/2023 26  20 - 32 mmol/L Final   Calcium 03/26/2023 9.4  8.6 - 10.3 mg/dL Final   Total Protein 29/52/8413 6.8  6.1 - 8.1 g/dL Final   Albumin 24/40/1027 4.2  3.6 - 5.1 g/dL Final   Globulin 25/36/6440 2.6  1.9 - 3.7 g/dL (calc) Final   AG Ratio 03/26/2023 1.6  1.0 - 2.5 (calc) Final   Total Bilirubin 03/26/2023 0.4  0.2 - 1.2 mg/dL Final   Alkaline phosphatase (APISO) 03/26/2023 67  35 - 144 U/L Final   AST 03/26/2023 17  10 - 35 U/L Final   ALT 03/26/2023  19  9 - 46 U/L Final   WBC 03/26/2023 6.7  3.8 - 10.8 Thousand/uL Final   RBC 03/26/2023 4.42  4.20 - 5.80 Million/uL Final   Hemoglobin 03/26/2023 13.0 (L)  13.2 - 17.1 g/dL Final   HCT 16/09/9603 38.6  38.5 - 50.0 % Final   MCV 03/26/2023 87.3  80.0 - 100.0 fL Final   MCH 03/26/2023 29.4  27.0 - 33.0 pg Final   MCHC 03/26/2023 33.7  32.0 - 36.0 g/dL Final   RDW 54/08/8118 12.8  11.0 - 15.0 % Final   Platelets 03/26/2023 253  140 - 400 Thousand/uL Final   MPV 03/26/2023 10.9  7.5 - 12.5 fL Final   Neutro Abs 03/26/2023 3,832  1,500 - 7,800 cells/uL Final   Lymphs Abs 03/26/2023 2,117  850 - 3,900 cells/uL Final   Absolute Monocytes 03/26/2023 590  200 - 950 cells/uL Final   Eosinophils Absolute 03/26/2023 141  15 - 500 cells/uL Final   Basophils Absolute 03/26/2023 20  0 - 200 cells/uL Final   Neutrophils Relative % 03/26/2023 57.2  % Final   Total Lymphocyte 03/26/2023 31.6  % Final   Monocytes Relative 03/26/2023 8.8  % Final   Eosinophils Relative 03/26/2023 2.1  % Final   Basophils Relative 03/26/2023 0.3  % Final   TEST NAME: 03/26/2023 PSA, TOTAL   Final   TEST CODE: 03/26/2023 [id number]   Final   CLIENT CONTACT: 03/26/2023 Dwana Curd   Final   REPORT ALWAYS MESSAGE SIGNATURE 03/26/2023    Final   Comment: . The laboratory testing on this  patient was verbally requested or confirmed by the ordering physician or his or her authorized representative after contact with an employee of [employer]. Federal regulations require that we maintain on file written authorization for all laboratory testing.  Accordingly we are asking that the ordering physician or his or her authorized representative sign a copy of this report and promptly return it to the client service representative. . . Signature:____________________________________________________ . Please fax this signed page to [phone] or return it via your [employer] courier.    Past Medical History:  Diagnosis Date   Coronary artery disease 2011    2 stents placed   H/O hiatal hernia    Hyperlipidemia    borderline; status post stress test x2, the last one 5-6 years ago and without significant abnormality per the patient   Hypertension    for approximately 5 years   Myocardial infarction [facility]) 2011   Pre-diabetes    Prediabetes    Past Surgical History:  Procedure Laterality Date   CHOLECYSTECTOMY     COLONOSCOPY     CORONARY ANGIOPLASTY     DES to mid and distal RCA 05/15/10   REVERSE SHOULDER ARTHROPLASTY Right 12/06/2020   Procedure: RIGHT REVERSE SHOULDER ARTHROPLASTY;  Surgeon: Cammy Copa, MD;  Location: [facility] OR;  Service: Orthopedics;  Laterality: Right;   Right shoulder rotator cuff arthropathy.  12/06/2020   ROTATOR CUFF REPAIR Left 10/20/2013   Dr August Saucer   SHOULDER ARTHROSCOPY WITH OPEN ROTATOR CUFF REPAIR Left 10/20/2013   Procedure: SHOULDER ARTHROSCOPY WITH OPEN ROTATOR CUFF REPAIR;  Surgeon: Cammy Copa, MD;  Location: [facility] OR;  Service: Orthopedics;  Laterality: Left;  Left shoulder arthroscopy, debridement, open biceps tenodesis and rotator cuff repair.   Current Outpatient Medications on File Prior to Visit  Medication Sig Dispense Refill   APPLE CIDER VINEGAR PO Take 1,200 mg by mouth daily.  ascorbic acid (VITAMIN  C) 500 MG tablet Take 500 mg by mouth daily.     aspirin EC 81 MG tablet Take 81 mg by mouth daily.     cetirizine (ZYRTEC) 10 MG tablet Take 10 mg by mouth at bedtime.     cholecalciferol (VITAMIN D) 25 MCG (1000 UNIT) tablet Take 1,000 Units by mouth in the morning and at bedtime.     donepezil (ARICEPT) 5 MG tablet Take 1 tablet (5 mg total) by mouth at bedtime. 90 tablet 3   ibuprofen (ADVIL) 200 MG tablet Take 400 mg by mouth every 8 (eight) hours as needed (pain.).     losartan (COZAAR) 50 MG tablet Take 1 tablet (50 mg total) by mouth daily. 90 tablet 3   metFORMIN (GLUCOPHAGE) 850 MG tablet TAKE 1 TABLET(850 MG) BY MOUTH TWICE DAILY WITH A MEAL 180 tablet 0   metoprolol tartrate (LOPRESSOR) 25 MG tablet Take 0.5 tablets (12.5 mg total) by mouth 2 (two) times daily. 90 tablet 3   nitroGLYCERIN (NITROSTAT) 0.4 MG SL tablet Place 1 tablet (0.4 mg total) under the tongue every 5 (five) minutes as needed for chest pain. 25 tablet 2   Zinc 50 MG TABS Take 50 mg by mouth daily.     No current facility-administered medications on file prior to visit.   No Known Allergies Social History   Socioeconomic History   Marital status: Married    Spouse name: Not on file   Number of children: 2   Years of education: Not on file   Highest education level: Not on file  Occupational History   Not on file  Tobacco Use   Smoking status: Former    Types: Cigarettes    Quit date: 12/03/1968    Years since quitting: 54.3   Smokeless tobacco: Never  Vaping Use   Vaping Use: Never used  Substance and Sexual Activity   Alcohol use: No   Drug use: No   Sexual activity: Not on file  Other Topics Concern   Not on file  Social History Narrative   Quit tobacco greater than 20 years ago with approximately 10 pack-year history. Denies alcohol or drug abuse   Social Determinants of Corporate investment banker Strain: Not on file  Food Insecurity: Not on file  Transportation Needs: Not on file   Physical Activity: Not on file  Stress: Not on file  Social Connections: Not on file  Intimate Partner Violence: Not on file   Family History  Problem Relation Age of Onset   Heart disease Father    Heart attack Other    Sudden death Paternal Uncle        possible MI   Colon cancer Neg Hx      Review of Systems  All other systems reviewed and are negative.      Objective:   Physical Exam Vitals reviewed.  Constitutional:      General: He is not in acute distress.    Appearance: He is well-developed. He is not diaphoretic.  HENT:     Head: Normocephalic and atraumatic.     Right Ear: External ear normal.     Left Ear: External ear normal.     Nose: Nose normal.     Mouth/Throat:     Pharynx: No oropharyngeal exudate.  Eyes:     General: No scleral icterus.       Right eye: No discharge.        Left eye:  No discharge.     Conjunctiva/sclera: Conjunctivae normal.     Pupils: Pupils are equal, round, and reactive to light.  Neck:     Thyroid: No thyromegaly.     Vascular: No JVD.     Trachea: No tracheal deviation.  Cardiovascular:     Rate and Rhythm: Normal rate and regular rhythm.     Heart sounds: Normal heart sounds. No murmur heard.    No friction rub. No gallop.  Pulmonary:     Effort: Pulmonary effort is normal. No respiratory distress.     Breath sounds: Normal breath sounds. No stridor. No wheezing or rales.  Chest:     Chest wall: No tenderness.  Abdominal:     General: Bowel sounds are normal. There is no distension.     Palpations: Abdomen is soft. There is no mass.     Tenderness: There is no abdominal tenderness. There is no guarding or rebound.  Musculoskeletal:        General: No tenderness. Normal range of motion.     Cervical back: Normal range of motion and neck supple.  Lymphadenopathy:     Cervical: No cervical adenopathy.  Skin:    General: Skin is warm.     Findings: Rash present.  Neurological:     Mental Status: He is alert and  oriented to person, place, and time.     Cranial Nerves: No cranial nerve deficit.     Motor: No abnormal muscle tone.     Coordination: Coordination normal.     Deep Tendon Reflexes: Reflexes are normal and symmetric.  Psychiatric:        Behavior: Behavior normal.        Thought Content: Thought content normal.        Judgment: Judgment normal.           Assessment & Plan:  General medical exam  Pure hypercholesterolemia  Controlled type 2 diabetes mellitus without complication, without long-term current use of insulin (HCC)  Benign essential HTN  Memory loss  Atherosclerosis of native coronary artery of native heart without angina pectoris Patient's memory loss seems stable.  I believe that he likely has some mild vascular dementia given that the memory loss has been stable.  There is a noticeable decline in his short-term memory.  His wife and his children have also seen a decline in his ability to be able to manage his emotions and control his temper which I believe represents a decline in his executive functioning however this is mild and only noticed by his family members.  Otherwise he is doing well and there have been no perceptible change.  Diabetic foot exam was performed today and is significant only for onychomycosis.  Blood pressure is excellent.  Lab work is outstanding.  LDL cholesterol is 33.  Given his memory loss, we did decide to switch to a hydrophilic statin/Crestor 10 mg a day due to potential impact on memory with lipophilic statins.  Plan to recheck blood work in 3 to 6 months.

## 2023-04-03 DIAGNOSIS — D225 Melanocytic nevi of trunk: Secondary | ICD-10-CM | POA: Diagnosis not present

## 2023-04-03 DIAGNOSIS — L570 Actinic keratosis: Secondary | ICD-10-CM | POA: Diagnosis not present

## 2023-04-03 DIAGNOSIS — D2261 Melanocytic nevi of right upper limb, including shoulder: Secondary | ICD-10-CM | POA: Diagnosis not present

## 2023-04-03 DIAGNOSIS — D2262 Melanocytic nevi of left upper limb, including shoulder: Secondary | ICD-10-CM | POA: Diagnosis not present

## 2023-04-03 DIAGNOSIS — Z85828 Personal history of other malignant neoplasm of skin: Secondary | ICD-10-CM | POA: Diagnosis not present

## 2023-04-16 DIAGNOSIS — K080 Exfoliation of teeth due to systemic causes: Secondary | ICD-10-CM | POA: Diagnosis not present

## 2023-06-01 ENCOUNTER — Other Ambulatory Visit: Payer: Self-pay | Admitting: Family Medicine

## 2023-06-03 NOTE — Telephone Encounter (Signed)
Requested Prescriptions  Pending Prescriptions Disp Refills   metFORMIN (GLUCOPHAGE) 850 MG tablet [Pharmacy Med Name: METFORMIN 850MG  TABLETS] 180 tablet 1    Sig: TAKE 1 TABLET(850 MG) BY MOUTH TWICE DAILY WITH A MEAL     Endocrinology:  Diabetes - Biguanides Failed - 06/01/2023  6:14 AM      Failed - Valid encounter within last 6 months    Recent Outpatient Visits           1 year ago General medical exam   [facility] Donita Brooks, MD   2 years ago General medical exam   [facility] Donita Brooks, MD   3 years ago Controlled type 2 diabetes mellitus without complication, without long-term current use of insulin (HCC)   [facility] [name], Priscille Heidelberg, MD   3 years ago Controlled type 2 diabetes mellitus without complication, without long-term current use of insulin (HCC)   [facility] [name], Priscille Heidelberg, MD   4 years ago URI, acute   [facility] [name], Priscille Heidelberg, MD              Passed - Cr in normal range and within 360 days    Creat  Date Value Ref Range Status  03/26/2023 1.06 0.70 - 1.28 mg/dL Final   Creatinine, Urine  Date Value Ref Range Status  03/26/2023 48 20 - 320 mg/dL Final         Passed - HBA1C is between 0 and 7.9 and within 180 days    Hgb A1c MFr Bld  Date Value Ref Range Status  03/26/2023 7.0 (H) <5.7 % of total Hgb Final    Comment:    For someone without known diabetes, a hemoglobin A1c value of 6.5% or greater indicates that they may have  diabetes and this should be confirmed with a follow-up  test. . For someone with known diabetes, a value <7% indicates  that their diabetes is well controlled and a value  greater than or equal to 7% indicates suboptimal  control. A1c targets should be individualized based on  duration of diabetes, age, comorbid conditions, and  other considerations. . Currently, no consensus exists regarding use of hemoglobin  A1c for diagnosis of diabetes for children. .          Passed - eGFR in normal range and within 360 days    GFR, Est African American  Date Value Ref Range Status  01/06/2021 90 > OR = 60 mL/min/1.3m2 Final   GFR, Est Non African American  Date Value Ref Range Status  01/06/2021 77 > OR = 60 mL/min/1.35m2 Final   GFR  Date Value Ref Range Status  07/21/2013 73.89 >60.00 mL/min Final   eGFR  Date Value Ref Range Status  03/26/2023 74 > OR = 60 mL/min/1.35m2 Final         Passed - B12 Level in normal range and within 720 days    Vitamin B-12  Date Value Ref Range Status  03/26/2023 338 200 - 1,100 pg/mL Final    Comment:    . Please Note: Although the reference range for vitamin B12 is [phone] pg/mL, it has been reported that between 5 and 10% of patients with values between 200 and 400 pg/mL may experience neuropsychiatric and hematologic abnormalities due to occult B12 deficiency; less than 1% of patients with values above 400 pg/mL will have symptoms. Marland Kitchen  Passed - CBC within normal limits and completed in the last 12 months    WBC  Date Value Ref Range Status  03/26/2023 6.7 3.8 - 10.8 Thousand/uL Final   RBC  Date Value Ref Range Status  03/26/2023 4.42 4.20 - 5.80 Million/uL Final   Hemoglobin  Date Value Ref Range Status  03/26/2023 13.0 (L) 13.2 - 17.1 g/dL Final   HCT  Date Value Ref Range Status  03/26/2023 38.6 38.5 - 50.0 % Final   MCHC  Date Value Ref Range Status  03/26/2023 33.7 32.0 - 36.0 g/dL Final   [facility]  Date Value Ref Range Status  03/26/2023 29.4 27.0 - 33.0 pg Final   MCV  Date Value Ref Range Status  03/26/2023 87.3 80.0 - 100.0 fL Final   No results found for: "PLTCOUNTKUC", "LABPLAT", "POCPLA" RDW  Date Value Ref Range Status  03/26/2023 12.8 11.0 - 15.0 % Final

## 2023-06-07 ENCOUNTER — Telehealth: Payer: Self-pay

## 2023-06-07 NOTE — Telephone Encounter (Signed)
Pt's wife called in to ask if pt could get a referral for a vein specialist for pt's varicose veins. Pt has a lump near or around the varicose vein and would like to have it looked at. Pt's wife would like a cb from nurse please. Please advise.  Cb#: [phone]

## 2023-06-07 NOTE — Telephone Encounter (Signed)
Pt spoke w/pcp and will call Monday if needs appt.

## 2023-07-01 ENCOUNTER — Ambulatory Visit: Payer: Medicare Other | Admitting: Family Medicine

## 2023-07-08 ENCOUNTER — Other Ambulatory Visit: Payer: Medicare Other

## 2023-07-08 DIAGNOSIS — E119 Type 2 diabetes mellitus without complications: Secondary | ICD-10-CM | POA: Diagnosis not present

## 2023-07-08 LAB — CBC WITH DIFFERENTIAL/PLATELET
Absolute Monocytes: 420 cells/uL (ref 200–950)
Basophils Absolute: 23 cells/uL (ref 0–200)
Basophils Relative: 0.3 %
Eosinophils Absolute: 83 cells/uL (ref 15–500)
Eosinophils Relative: 1.1 %
HCT: 39.3 % (ref 38.5–50.0)
Hemoglobin: 13.3 g/dL (ref 13.2–17.1)
Lymphs Abs: 1583 cells/uL (ref 850–3900)
MCH: 30.2 pg (ref 27.0–33.0)
MCHC: 33.8 g/dL (ref 32.0–36.0)
MCV: 89.3 fL (ref 80.0–100.0)
MPV: 11.2 fL (ref 7.5–12.5)
Monocytes Relative: 5.6 %
Neutro Abs: 5393 cells/uL (ref 1500–7800)
Neutrophils Relative %: 71.9 %
Platelets: 265 10*3/uL (ref 140–400)
RBC: 4.4 10*6/uL (ref 4.20–5.80)
RDW: 12.3 % (ref 11.0–15.0)
Total Lymphocyte: 21.1 %
WBC: 7.5 10*3/uL (ref 3.8–10.8)

## 2023-07-11 ENCOUNTER — Ambulatory Visit: Payer: Medicare Other | Admitting: Family Medicine

## 2023-07-12 ENCOUNTER — Ambulatory Visit (INDEPENDENT_AMBULATORY_CARE_PROVIDER_SITE_OTHER): Payer: Medicare Other | Admitting: Family Medicine

## 2023-07-12 ENCOUNTER — Encounter: Payer: Self-pay | Admitting: Family Medicine

## 2023-07-12 VITALS — BP 126/72 | HR 68 | Temp 98.4°F | Ht 70.5 in | Wt 213.0 lb

## 2023-07-12 DIAGNOSIS — R413 Other amnesia: Secondary | ICD-10-CM

## 2023-07-12 DIAGNOSIS — E119 Type 2 diabetes mellitus without complications: Secondary | ICD-10-CM | POA: Diagnosis not present

## 2023-07-12 DIAGNOSIS — Z7984 Long term (current) use of oral hypoglycemic drugs: Secondary | ICD-10-CM

## 2023-07-12 DIAGNOSIS — I2510 Atherosclerotic heart disease of native coronary artery without angina pectoris: Secondary | ICD-10-CM

## 2023-07-12 MED ORDER — EMPAGLIFLOZIN 10 MG PO TABS: 10.0000 mg | ORAL_TABLET | Freq: Every day | ORAL | 11 refills | Status: DC

## 2023-07-12 NOTE — Progress Notes (Signed)
Wt Readings from Last 3 Encounters:  07/12/23 213 lb (96.6 kg)  04/01/23 208 lb 12.8 oz (94.7 kg)  10/30/22 210 lb (95.3 kg)     Subjective:    Patient ID: Paul Rubio, male    DOB: December 03, 1951, 72 y.o.   MRN: [id number]  HPI At the patient's last office visit, we switched Lipitor to Crestor over potential concerns about his memory impairment.  The patient has mild age-related cognitive decline.  I suspect that he likely has vascular dementia.  At the present time his blood pressure is outstanding.  His LDL cholesterol was 33 at his last visit.  His hemoglobin A1c however is still slightly high at 6.9.  He also has a history of cardiovascular disease.  He is currently managing his sugars with metformin Immunization History  Administered Date(s) Administered   Fluad Quad(high Dose 65+) 08/27/2019, 09/14/2020, 10/31/2021, 09/28/2022   Influenza, High Dose Seasonal PF 09/12/2018   Influenza,inj,Quad PF,6+ Mos 10/21/2013, 09/28/2014, 12/27/2015, 09/27/2016, 10/01/2017   PFIZER(Purple Top)SARS-COV-2 Vaccination 01/25/2020, 02/15/2020, 09/04/2020   Pfizer Covid-19 Vaccine Bivalent Booster 8yrs & up 12/24/2021   Pneumococcal Conjugate-13 12/27/2015   Pneumococcal Polysaccharide-23 12/17/2014   Td 11/21/2012   Tdap 11/21/2012   Zoster Recombinant(Shingrix) 09/06/2019, 11/15/2019   Lab on 07/08/2023  Component Date Value Ref Range Status   WBC 07/08/2023 7.5  3.8 - 10.8 Thousand/uL Final   RBC 07/08/2023 4.40  4.20 - 5.80 Million/uL Final   Hemoglobin 07/08/2023 13.3  13.2 - 17.1 g/dL Final   HCT 44/01/4741 39.3  38.5 - 50.0 % Final   MCV 07/08/2023 89.3  80.0 - 100.0 fL Final   MCH 07/08/2023 30.2  27.0 - 33.0 pg Final   MCHC 07/08/2023 33.8  32.0 - 36.0 g/dL Final   RDW 59/56/3875 12.3  11.0 - 15.0 % Final   Platelets 07/08/2023 265  140 - 400 Thousand/uL Final   MPV 07/08/2023 11.2  7.5 - 12.5 fL Final   Neutro Abs 07/08/2023 5,393  1,500 - 7,800 cells/uL Final   Lymphs Abs  07/08/2023 1,583  850 - 3,900 cells/uL Final   Absolute Monocytes 07/08/2023 420  200 - 950 cells/uL Final   Eosinophils Absolute 07/08/2023 83  15 - 500 cells/uL Final   Basophils Absolute 07/08/2023 23  0 - 200 cells/uL Final   Neutrophils Relative % 07/08/2023 71.9  % Final   Total Lymphocyte 07/08/2023 21.1  % Final   Monocytes Relative 07/08/2023 5.6  % Final   Eosinophils Relative 07/08/2023 1.1  % Final   Basophils Relative 07/08/2023 0.3  % Final   Glucose, Bld 07/08/2023 142 (H)  65 - 99 mg/dL Final   Comment: .            Fasting reference interval . For someone without known diabetes, a glucose value >125 mg/dL indicates that they may have diabetes and this should be confirmed with a follow-up test. .    BUN 07/08/2023 24  7 - 25 mg/dL Final   Creat 64/33/2951 1.15  0.70 - 1.28 mg/dL Final   eGFR 88/41/6606 67  > OR = 60 mL/min/1.20m2 Final   BUN/Creatinine Ratio 07/08/2023 SEE NOTE:  6 - 22 (calc) Final   Comment:    Not Reported: BUN and Creatinine are within    reference range. .    Sodium 07/08/2023 140  135 - 146 mmol/L Final   Potassium 07/08/2023 4.5  3.5 - 5.3 mmol/L Final   Chloride 07/08/2023 105  98 - 110  mmol/L Final   CO2 07/08/2023 28  20 - 32 mmol/L Final   Calcium 07/08/2023 9.5  8.6 - 10.3 mg/dL Final   Total Protein 16/09/9603 6.9  6.1 - 8.1 g/dL Final   Albumin 54/08/8118 4.2  3.6 - 5.1 g/dL Final   Globulin 14/78/2956 2.7  1.9 - 3.7 g/dL (calc) Final   AG Ratio 07/08/2023 1.6  1.0 - 2.5 (calc) Final   Total Bilirubin 07/08/2023 0.3  0.2 - 1.2 mg/dL Final   Alkaline phosphatase (APISO) 07/08/2023 60  35 - 144 U/L Final   AST 07/08/2023 16  10 - 35 U/L Final   ALT 07/08/2023 15  9 - 46 U/L Final   Hgb A1c MFr Bld 07/08/2023 6.9 (H)  <5.7 % of total Hgb Final   Comment: For someone without known diabetes, a hemoglobin A1c value of 6.5% or greater indicates that they may have  diabetes and this should be confirmed with a follow-up  test. . For  someone with known diabetes, a value <7% indicates  that their diabetes is well controlled and a value  greater than or equal to 7% indicates suboptimal  control. A1c targets should be individualized based on  duration of diabetes, age, comorbid conditions, and  other considerations. . Currently, no consensus exists regarding use of hemoglobin A1c for diagnosis of diabetes for children. .    Mean Plasma Glucose 07/08/2023 151  mg/dL Final   eAG (mmol/L) 21/30/8657 8.4  mmol/L Final   Comment: . This test was performed on the Roche cobas c503 platform. Effective 09/10/22, a change in test platforms from the Abbott Architect to the Roche cobas c503 may have shifted HbA1c results compared to historical results. Based on laboratory validation testing conducted at [facility], the Roche platform relative to the Abbott platform had an average increase in HbA1c value of < or = 0.3%. This difference is within accepted  variability established by the [facility]. Note that not all individuals will have had a shift in their results and direct comparisons between historical and current results for testing conducted on different platforms is not recommended.    Past Medical History:  Diagnosis Date   Coronary artery disease 2011    2 stents placed   H/O hiatal hernia    Hyperlipidemia    borderline; status post stress test x2, the last one 5-6 years ago and without significant abnormality per the patient   Hypertension    for approximately 5 years   Myocardial infarction [facility]) 2011   Pre-diabetes    Prediabetes    Past Surgical History:  Procedure Laterality Date   CHOLECYSTECTOMY     COLONOSCOPY     CORONARY ANGIOPLASTY     DES to mid and distal RCA 05/15/10   REVERSE SHOULDER ARTHROPLASTY Right 12/06/2020   Procedure: RIGHT REVERSE SHOULDER ARTHROPLASTY;  Surgeon: Cammy Copa, MD;  Location: [facility] OR;  Service: Orthopedics;  Laterality: Right;    Right shoulder rotator cuff arthropathy.  12/06/2020   ROTATOR CUFF REPAIR Left 10/20/2013   Dr August Saucer   SHOULDER ARTHROSCOPY WITH OPEN ROTATOR CUFF REPAIR Left 10/20/2013   Procedure: SHOULDER ARTHROSCOPY WITH OPEN ROTATOR CUFF REPAIR;  Surgeon: Cammy Copa, MD;  Location: [facility] OR;  Service: Orthopedics;  Laterality: Left;  Left shoulder arthroscopy, debridement, open biceps tenodesis and rotator cuff repair.   Current Outpatient Medications on File Prior to Visit  Medication Sig Dispense Refill   APPLE CIDER VINEGAR PO Take 1,200 mg by  mouth daily.     ascorbic acid (VITAMIN C) 500 MG tablet Take 500 mg by mouth daily.     aspirin EC 81 MG tablet Take 81 mg by mouth daily.     cetirizine (ZYRTEC) 10 MG tablet Take 10 mg by mouth at bedtime.     cholecalciferol (VITAMIN D) 25 MCG (1000 UNIT) tablet Take 1,000 Units by mouth in the morning and at bedtime.     donepezil (ARICEPT) 5 MG tablet Take 1 tablet (5 mg total) by mouth at bedtime. 90 tablet 3   ibuprofen (ADVIL) 200 MG tablet Take 400 mg by mouth every 8 (eight) hours as needed (pain.).     losartan (COZAAR) 50 MG tablet Take 1 tablet (50 mg total) by mouth daily. 90 tablet 3   metFORMIN (GLUCOPHAGE) 850 MG tablet TAKE 1 TABLET(850 MG) BY MOUTH TWICE DAILY WITH A MEAL 180 tablet 1   metoprolol tartrate (LOPRESSOR) 25 MG tablet Take 0.5 tablets (12.5 mg total) by mouth 2 (two) times daily. 90 tablet 3   nitroGLYCERIN (NITROSTAT) 0.4 MG SL tablet Place 1 tablet (0.4 mg total) under the tongue every 5 (five) minutes as needed for chest pain. 25 tablet 2   rosuvastatin (CRESTOR) 10 MG tablet Take 1 tablet (10 mg total) by mouth daily. 90 tablet 3   Zinc 50 MG TABS Take 50 mg by mouth daily.     No current facility-administered medications on file prior to visit.   No Known Allergies Social History   Socioeconomic History   Marital status: Married    Spouse name: Not on file   Number of children: 2   Years of education: Not on  file   Highest education level: Not on file  Occupational History   Not on file  Tobacco Use   Smoking status: Former    Current packs/day: 0.00    Types: Cigarettes    Quit date: 12/03/1968    Years since quitting: 54.6   Smokeless tobacco: Never  Vaping Use   Vaping status: Never Used  Substance and Sexual Activity   Alcohol use: No   Drug use: No   Sexual activity: Not on file  Other Topics Concern   Not on file  Social History Narrative   Quit tobacco greater than 20 years ago with approximately 10 pack-year history. Denies alcohol or drug abuse   Social Determinants of Corporate investment banker Strain: Not on file  Food Insecurity: Not on file  Transportation Needs: Not on file  Physical Activity: Not on file  Stress: Not on file  Social Connections: Not on file  Intimate Partner Violence: Not on file   Family History  Problem Relation Age of Onset   Heart disease Father    Heart attack Other    Sudden death Paternal Uncle        possible MI   Colon cancer Neg Hx      Review of Systems  All other systems reviewed and are negative.      Objective:   Physical Exam Vitals reviewed.  Constitutional:      General: He is not in acute distress.    Appearance: He is well-developed. He is not diaphoretic.  HENT:     Head: Normocephalic and atraumatic.     Right Ear: External ear normal.     Left Ear: External ear normal.     Nose: Nose normal.     Mouth/Throat:     Pharynx: No oropharyngeal  exudate.  Eyes:     General: No scleral icterus.       Right eye: No discharge.        Left eye: No discharge.     Conjunctiva/sclera: Conjunctivae normal.     Pupils: Pupils are equal, round, and reactive to light.  Neck:     Thyroid: No thyromegaly.     Vascular: No JVD.     Trachea: No tracheal deviation.  Cardiovascular:     Rate and Rhythm: Normal rate and regular rhythm.     Heart sounds: Normal heart sounds. No murmur heard.    No friction rub. No gallop.   Pulmonary:     Effort: Pulmonary effort is normal. No respiratory distress.     Breath sounds: Normal breath sounds. No stridor. No wheezing or rales.  Chest:     Chest wall: No tenderness.  Abdominal:     General: Bowel sounds are normal. There is no distension.     Palpations: Abdomen is soft. There is no mass.     Tenderness: There is no abdominal tenderness. There is no guarding or rebound.  Musculoskeletal:        General: No tenderness. Normal range of motion.     Cervical back: Normal range of motion and neck supple.  Lymphadenopathy:     Cervical: No cervical adenopathy.  Skin:    General: Skin is warm.     Findings: Rash present.  Neurological:     Mental Status: He is alert and oriented to person, place, and time.     Cranial Nerves: No cranial nerve deficit.     Motor: No abnormal muscle tone.     Coordination: Coordination normal.     Deep Tendon Reflexes: Reflexes are normal and symmetric.  Psychiatric:        Behavior: Behavior normal.        Thought Content: Thought content normal.        Judgment: Judgment normal.           Assessment & Plan:  Memory loss  Atherosclerosis of native coronary artery of native heart without angina pectoris  Controlled type 2 diabetes mellitus without complication, without long-term current use of insulin (HCC) Patient's memory loss remains stable.  I suspect vascular dementia.  We discussed managing risk factors.  Blood pressure and cholesterol are outstanding.  Will try to get A1c less than 6.5.  Would also like the patient be on Jardiance for the cardiovascular protective benefit.  Begin Jardiance 10 mg a day and recheck in 6 months

## 2023-07-15 ENCOUNTER — Other Ambulatory Visit (INDEPENDENT_AMBULATORY_CARE_PROVIDER_SITE_OTHER): Payer: Medicare Other

## 2023-07-15 ENCOUNTER — Ambulatory Visit: Payer: Medicare Other | Admitting: Surgical

## 2023-07-15 DIAGNOSIS — M79642 Pain in left hand: Secondary | ICD-10-CM

## 2023-07-19 ENCOUNTER — Encounter: Payer: Self-pay | Admitting: Surgical

## 2023-07-19 NOTE — Progress Notes (Signed)
Office Visit Note   Patient: Paul Rubio           Date of Birth: 1951-04-29           MRN: [id number] Visit Date: 07/15/2023 Requested by: Donita Brooks, MD [address] [place],  [place] [address] PCP: Donita Brooks, MD  Subjective: Chief Complaint  Patient presents with   Left Hand - Pain    DOI: 07/14/23    HPI: Paul Rubio is a 72 y.o. male who presents to the office reporting left hand pain.  Localizes pain to the second and third MCP joints.  Pain began last night after he had his hand pinned against a metal pole by one of his young callus.  He is right-hand dominant.  Localizes pain to the dorsal aspect of the MCP joints.  Has difficulty making a full fist.  Was able to sleep last night without any issue.  Pain is overall controlled.  Had a lot of swelling last night and that is significantly improved today though does persist.  No mechanical symptoms in the hand.  No wrist pain.  No other complaints..                ROS: All systems reviewed are negative as they relate to the chief complaint within the history of present illness.  Patient denies fevers or chills.  Assessment & Plan: Visit Diagnoses:  1. Pain in left hand     Plan: Patient is a 72 year old male who presents for evaluation of left hand pain.  Pain began last night after having his hand pinned against a metal pole by a cow.  Had increased swelling last night that has improved today.  No ecchymosis on exam today.  No evidence of fracture based on today's radiographs.  We discussed options such as bracing versus further advanced imaging.  With lack of significant symptoms, plan for wrist brace and 10-day return with likely repeat radiographs at that time if there are any concerns.  Follow-up with Dr. August Saucer in 10 days.  Follow-Up Instructions: No follow-ups on file.   Orders:  Orders Placed This Encounter  Procedures   XR Hand Complete Left   No orders of the defined types were placed in  this encounter.     Procedures: No procedures performed   Clinical Data: No additional findings.  Objective: Vital Signs: There were no vitals taken for this visit.  Physical Exam:  Constitutional: Patient appears well-developed HEENT:  Head: Normocephalic Eyes:EOM are normal Neck: Normal range of motion Cardiovascular: Normal rate Pulmonary/chest: Effort normal Neurologic: Patient is alert Skin: Skin is warm Psychiatric: Patient has normal mood and affect  Ortho Exam: Ortho exam demonstrates left hand with palpable radial pulse rated 2+.  Intact EPL, FPL, finger abduction.  Unable to make full composite fist.  There is moderate swelling through the second and third MCP joints with mild tenderness in this area.  There is no ecchymosis.  There are very small abrasions and lacerations not requiring any intervention.  No crepitus in this area.  No loss of knuckle contour.  Specialty Comments:  No specialty comments available.  Imaging: No results found.   PMFS History: Patient Active Problem List   Diagnosis Date Noted   Right lower lobe pneumonia 12/07/2020   S/P reverse total shoulder arthroplasty, right 12/06/2020   Type 2 diabetes mellitus with complication, without long-term current use of insulin (HCC)    Acute sinusitis 02/10/2014  Acute bronchitis 02/10/2014   ESSENTIAL HYPERTENSION, BENIGN 07/25/2010   Mixed hyperlipidemia 05/31/2010   CAD, NATIVE VESSEL 05/31/2010   FATIGUE / MALAISE 05/31/2010   HYPERTENSION, HX OF 05/31/2010   Past Medical History:  Diagnosis Date   Coronary artery disease 2011    2 stents placed   H/O hiatal hernia    Hyperlipidemia    borderline; status post stress test x2, the last one 5-6 years ago and without significant abnormality per the patient   Hypertension    for approximately 5 years   Myocardial infarction [facility]) 2011   Pre-diabetes    Prediabetes     Family History  Problem Relation Age of Onset   Heart disease  Father    Heart attack Other    Sudden death Paternal Uncle        possible MI   Colon cancer Neg Hx     Past Surgical History:  Procedure Laterality Date   CHOLECYSTECTOMY     COLONOSCOPY     CORONARY ANGIOPLASTY     DES to mid and distal RCA 05/15/10   REVERSE SHOULDER ARTHROPLASTY Right 12/06/2020   Procedure: RIGHT REVERSE SHOULDER ARTHROPLASTY;  Surgeon: Cammy Copa, MD;  Location: [facility] OR;  Service: Orthopedics;  Laterality: Right;   Right shoulder rotator cuff arthropathy.  12/06/2020   ROTATOR CUFF REPAIR Left 10/20/2013   Dr August Saucer   SHOULDER ARTHROSCOPY WITH OPEN ROTATOR CUFF REPAIR Left 10/20/2013   Procedure: SHOULDER ARTHROSCOPY WITH OPEN ROTATOR CUFF REPAIR;  Surgeon: Cammy Copa, MD;  Location: [facility] OR;  Service: Orthopedics;  Laterality: Left;  Left shoulder arthroscopy, debridement, open biceps tenodesis and rotator cuff repair.   Social History   Occupational History   Not on file  Tobacco Use   Smoking status: Former    Current packs/day: 0.00    Types: Cigarettes    Quit date: 12/03/1968    Years since quitting: 54.6   Smokeless tobacco: Never  Vaping Use   Vaping status: Never Used  Substance and Sexual Activity   Alcohol use: No   Drug use: No   Sexual activity: Not on file

## 2023-07-29 ENCOUNTER — Ambulatory Visit: Payer: Medicare Other | Admitting: Surgical

## 2023-08-30 ENCOUNTER — Other Ambulatory Visit: Payer: Self-pay | Admitting: Family Medicine

## 2023-08-30 ENCOUNTER — Other Ambulatory Visit (HOSPITAL_BASED_OUTPATIENT_CLINIC_OR_DEPARTMENT_OTHER): Payer: Self-pay | Admitting: Family

## 2023-08-30 DIAGNOSIS — I25118 Atherosclerotic heart disease of native coronary artery with other forms of angina pectoris: Secondary | ICD-10-CM

## 2023-08-30 NOTE — Telephone Encounter (Signed)
Requested Prescriptions  Pending Prescriptions Disp Refills   donepezil (ARICEPT) 5 MG tablet [Pharmacy Med Name: DONEPEZIL 5MG  TABLETS] 90 tablet 1    Sig: TAKE 1 TABLET(5 MG) BY MOUTH AT BEDTIME     Neurology:  Alzheimer's Agents Failed - 08/30/2023  6:14 AM      Failed - Valid encounter within last 6 months    Recent Outpatient Visits           1 year ago General medical exam   [facility] Donita Brooks, MD   2 years ago General medical exam   [facility] Donita Brooks, MD   3 years ago Controlled type 2 diabetes mellitus without complication, without long-term current use of insulin (HCC)   [facility] Donita Brooks, MD   3 years ago Controlled type 2 diabetes mellitus without complication, without long-term current use of insulin (HCC)   [facility] [name], Priscille Heidelberg, MD   4 years ago URI, acute   [facility] [name], Priscille Heidelberg, MD       Future Appointments             In 1 month Dan Humphreys, Storm Frisk, NP [facility], [place]   In 4 months [name], Priscille Heidelberg, MD [facility]

## 2023-09-02 DIAGNOSIS — H40023 Open angle with borderline findings, high risk, bilateral: Secondary | ICD-10-CM | POA: Diagnosis not present

## 2023-09-25 ENCOUNTER — Ambulatory Visit: Payer: Medicare Other

## 2023-09-25 DIAGNOSIS — Z23 Encounter for immunization: Secondary | ICD-10-CM

## 2023-09-25 NOTE — Progress Notes (Signed)
Pt came in today for high dose flu vaccine. Vaccine give on l-upper arm. Pt tol injection well w/no c/o. Pt left ambulatory w/no c/o.

## 2023-10-01 DIAGNOSIS — H401112 Primary open-angle glaucoma, right eye, moderate stage: Secondary | ICD-10-CM | POA: Diagnosis not present

## 2023-10-02 DIAGNOSIS — K080 Exfoliation of teeth due to systemic causes: Secondary | ICD-10-CM | POA: Diagnosis not present

## 2023-10-09 DIAGNOSIS — C4442 Squamous cell carcinoma of skin of scalp and neck: Secondary | ICD-10-CM | POA: Diagnosis not present

## 2023-10-09 DIAGNOSIS — D225 Melanocytic nevi of trunk: Secondary | ICD-10-CM | POA: Diagnosis not present

## 2023-10-09 DIAGNOSIS — L821 Other seborrheic keratosis: Secondary | ICD-10-CM | POA: Diagnosis not present

## 2023-10-09 DIAGNOSIS — L570 Actinic keratosis: Secondary | ICD-10-CM | POA: Diagnosis not present

## 2023-10-09 DIAGNOSIS — Z85828 Personal history of other malignant neoplasm of skin: Secondary | ICD-10-CM | POA: Diagnosis not present

## 2023-10-09 DIAGNOSIS — L814 Other melanin hyperpigmentation: Secondary | ICD-10-CM | POA: Diagnosis not present

## 2023-10-21 ENCOUNTER — Encounter: Payer: Self-pay | Admitting: Family Medicine

## 2023-10-21 DIAGNOSIS — C4442 Squamous cell carcinoma of skin of scalp and neck: Secondary | ICD-10-CM | POA: Diagnosis not present

## 2023-10-21 NOTE — Telephone Encounter (Signed)
 Care team updated and letter sent for eye exam notes.

## 2023-10-28 ENCOUNTER — Ambulatory Visit (HOSPITAL_BASED_OUTPATIENT_CLINIC_OR_DEPARTMENT_OTHER): Payer: Medicare Other | Admitting: Family

## 2023-10-28 ENCOUNTER — Encounter (HOSPITAL_BASED_OUTPATIENT_CLINIC_OR_DEPARTMENT_OTHER): Payer: Self-pay | Admitting: Family

## 2023-10-28 VITALS — BP 120/80 | HR 65 | Ht 70.0 in | Wt 213.8 lb

## 2023-10-28 DIAGNOSIS — I10 Essential (primary) hypertension: Secondary | ICD-10-CM | POA: Diagnosis not present

## 2023-10-28 DIAGNOSIS — R9431 Abnormal electrocardiogram [ECG] [EKG]: Secondary | ICD-10-CM

## 2023-10-28 DIAGNOSIS — I25118 Atherosclerotic heart disease of native coronary artery with other forms of angina pectoris: Secondary | ICD-10-CM

## 2023-10-28 DIAGNOSIS — E785 Hyperlipidemia, unspecified: Secondary | ICD-10-CM | POA: Diagnosis not present

## 2023-10-28 DIAGNOSIS — I2510 Atherosclerotic heart disease of native coronary artery without angina pectoris: Secondary | ICD-10-CM

## 2023-10-28 NOTE — Patient Instructions (Signed)
Medication Instructions:  Your physician recommends that you continue on your current medications as directed. Please refer to the Current Medication list given to you today.  *If you need a refill on your cardiac medications before your next appointment, please call your pharmacy*   Testing/Procedures:  How to Prepare for Your Cardiac PET/CT Stress Test:  1. Please do not take these medications before your test:   Medications that may interfere with the cardiac pharmacological stress agent (ex. nitrates - including erectile dysfunction medications, isosorbide mononitrate- [please start to hold this medication the day before the test], tamulosin or beta-blockers) the day of the exam. (Erectile dysfunction medication should be held for at least 72 hrs prior to test) Theophylline containing medications for 12 hours. Dipyridamole 48 hours prior to the test. Your remaining medications may be taken with water.  2. Nothing to eat or drink, except water, 3 hours prior to arrival time.   NO caffeine/decaffeinated products, or chocolate 12 hours prior to arrival.  3. NO perfume, cologne or lotion on chest or abdomen area.          - FEMALES - Please avoid wearing dresses to this appointment.  4. Total time is 1 to 2 hours; you may want to bring reading material for the waiting time.  5. Please report to Radiology at the [facility] Main Entrance 30 minutes early for your test.  [address] [place], [place] [address]  6. Please report to Radiology at [facility] Main Entrance, medical mall, 30 mins prior to your test.  [address]  [place], [place]  [phone]  Diabetic Preparation:  Hold oral medications. You may take NPH and Lantus insulin. Do not take Humalog or Humulin R (Regular Insulin) the day of your test. Check blood sugars prior to leaving the house. If able to eat breakfast prior to 3 hour fasting, you may take all medications,  including your insulin, Do not worry if you miss your breakfast dose of insulin - start at your next meal. Patients who wear a continuous glucose monitor MUST remove the device prior to scanning.  IF YOU THINK YOU MAY BE PREGNANT, OR ARE NURSING PLEASE INFORM THE TECHNOLOGIST.  In preparation for your appointment, medication and supplies will be purchased.  Appointment availability is limited, so if you need to cancel or reschedule, please call the Radiology Department at [phone] [facility]) OR [phone] [facility])  24 hours in advance to avoid a cancellation fee of $100.00  What to Expect After you Arrive:  Once you arrive and check in for your appointment, you will be taken to a preparation room within the Radiology Department.  A technologist or Nurse will obtain your medical history, verify that you are correctly prepped for the exam, and explain the procedure.  Afterwards,  an IV will be started in your arm and electrodes will be placed on your skin for EKG monitoring during the stress portion of the exam. Then you will be escorted to the PET/CT scanner.  There, staff will get you positioned on the scanner and obtain a blood pressure and EKG.  During the exam, you will continue to be connected to the EKG and blood pressure machines.  A small, safe amount of a radioactive tracer will be injected in your IV to obtain a series of pictures of your heart along with an injection of a stress agent.    After your Exam:  It is recommended that you eat a  meal and drink a caffeinated beverage to counter act any effects of the stress agent.  Drink plenty of fluids for the remainder of the day and urinate frequently for the first couple of hours after the exam.  Your doctor will inform you of your test results within 7-10 business days.  For more information and frequently asked questions, please visit our website : [email or web address]  For questions about your test or how to prepare  for your test, please call: Cardiac Imaging Nurse Navigators Office: [phone]   Follow-Up: At [facility], you and your health needs are our priority.  As part of our continuing mission to provide you with exceptional heart care, we have created designated Provider Care Teams.  These Care Teams include your primary Cardiologist (physician) and Advanced Practice Providers (APPs -  Physician Assistants and Nurse Practitioners) who all work together to provide you with the care you need, when you need it.  We recommend signing up for the patient portal called "MyChart".  Sign up information is provided on this After Visit Summary.  MyChart is used to connect with patients for Virtual Visits (Telemedicine).  Patients are able to view lab/test results, encounter notes, upcoming appointments, etc.  Non-urgent messages can be sent to your provider as well.   To learn more about what you can do with MyChart, go to [email or web address].    Your next appointment:   2 month(s)  Provider:   Dr. Excell Seltzer or Gillian Shields, NP

## 2023-10-28 NOTE — Progress Notes (Unsigned)
Cardiology Office Note:  .   Date:  10/30/2023  ID:  Paul Rubio, DOB Jun 26, 1951, MRN [id number] PCP: Donita Brooks, MD  [facility] Providers Cardiologist:  Tonny Bollman, MD Cardiology APP:  Alver Sorrow, NP    History of Present Illness: .   Paul Rubio is a 72 y.o. male  with a hx of CAD s/p DES x 2 to mid and distal RCA in 2011, mild cognitive decline,  hypertension, hyperlipidemia, DM2.   He presented with NSTEMi 2011 and trested with DES x 2 to mid and distal RCA. He was last seen 10/2020 and doing well from cardiac perspective. Since last seen underwent right reverse shoulder surgery 12/2020.    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 No changes were made.    Seen by PCP 06/29/22 with mild cognitive decline suspected to be vascular dementia. His Aricept was increased. PCP has switched Atorvastatin to Rosuvastatin due to memory concerns.     He presents today for follow up with his wife.  Doing well since last seen.  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  Eats predominantly at home and does not add much salt to his food. Reports no shortness of breath nor dyspnea on exertion. Reports no chest pain, pressure, or tightness. No edema, orthopnea, PND. Reports no palpitations.  Energy level will decrease some by 7 PM.  Endorses sleeping swell. No swelling, lightheadedness, dizziness.   ROS: Please see the history of present illness.    All other systems reviewed and are negative.   Studies Reviewed: Marland Kitchen   EKG Interpretation Date/Time:  Monday October 28 2023 15:10:42 EST Ventricular Rate:  65 PR Interval:  112 QRS Duration:  96 QT Interval:  404 QTC Calculation: 420 R Axis:   13  Text Interpretation: Normal sinus rhythm TWI now present in lead III, aVF. T wave inversion now evident in Lateral leads TWI has been previously present in lead III but not other leads on independent review. Confirmed by Gillian Shields  ([id number]) on 10/28/2023 3:20:57 PM        Risk Assessment/Calculations:            Physical Exam:   VS:  BP 120/80   Pulse 65   Ht 5\' 10"  (1.778 m)   Wt 213 lb 12.8 oz (97 kg)   SpO2 93%   BMI 30.68 kg/m    Wt Readings from Last 3 Encounters:  10/28/23 213 lb 12.8 oz (97 kg)  07/12/23 213 lb (96.6 kg)  04/01/23 208 lb 12.8 oz (94.7 kg)    GEN: Well nourished, well developed in no acute distress NECK: No JVD; No carotid bruits CARDIAC: RRR, no murmurs, rubs, gallops RESPIRATORY:  Clear to auscultation without rales, wheezing or rhonchi  ABDOMEN: Soft, non-tender, non-distended EXTREMITIES:  No edema; No deformity   ASSESSMENT AND PLAN: .    CAD s/p DES x 2 to mid and distal RCA 2011 - EKG today new TWI. Reports no chest pain, dyspnea and is active on his farm. Does note some fatigue by end of day. Given his history and family history plan for cardiac PET.  GDMT includes aspirin, metoprolol, Rosuvastatin, as needed nitroglycerin. Heart healthy diet and regular cardiovascular exercise encouraged.     HTN - BP well controlled. Continue current antihypertensive regimen.     HLD, LDL goal <70 - 03/2023 LDL 33. Cholesterol numbers at goal.  Continue Rosuvastatin 10mg  daily.  DM2 -follows with PCP.  07/2023 A1c 6.9. Appreciate inclusion of SGLT2 I for cardioprotective benefit.      Informed Consent   Shared Decision Making/Informed Consent The risks [chest pain, shortness of breath, cardiac arrhythmias, dizziness, blood pressure fluctuations, myocardial infarction, stroke/transient ischemic attack, nausea, vomiting, allergic reaction, radiation exposure, metallic taste sensation and life-threatening complications (estimated to be 1 in 10,000)], benefits (risk stratification, diagnosing coronary artery disease, treatment guidance) and alternatives of a cardiac PET stress test were discussed in detail with Paul Rubio and he agrees to proceed.     Dispo: follow up in 2  months  Signed, Alver Sorrow, NP

## 2023-11-26 ENCOUNTER — Other Ambulatory Visit (HOSPITAL_BASED_OUTPATIENT_CLINIC_OR_DEPARTMENT_OTHER): Payer: Self-pay | Admitting: Family

## 2023-11-26 ENCOUNTER — Other Ambulatory Visit: Payer: Self-pay | Admitting: Family Medicine

## 2023-11-26 DIAGNOSIS — I25118 Atherosclerotic heart disease of native coronary artery with other forms of angina pectoris: Secondary | ICD-10-CM

## 2023-12-06 ENCOUNTER — Encounter (HOSPITAL_COMMUNITY): Payer: Self-pay

## 2023-12-09 ENCOUNTER — Telehealth (HOSPITAL_COMMUNITY): Payer: Self-pay | Admitting: *Deleted

## 2023-12-09 NOTE — Telephone Encounter (Signed)
 Returning patient's wife's call about his upcoming cardiac imaging study; pt's wife verbalizes understanding of appt date/time, parking situation and where to check in, pre-test NPO status; name and call back number provided for further questions should they arise  Chantal Requena RN Navigator Cardiac Imaging [facility] [phone] office [phone] cell  Patient's wife is aware that the patient is to avoid caffeine 12 hours prior to his cardiac PET scan.

## 2023-12-10 ENCOUNTER — Encounter (HOSPITAL_COMMUNITY)
Admission: RE | Admit: 2023-12-10 | Discharge: 2023-12-10 | Disposition: A | Discharge status: Home / Self Care | Payer: Medicare Other | Source: Ambulatory Visit | Attending: Family | Admitting: Family

## 2023-12-10 DIAGNOSIS — I25118 Atherosclerotic heart disease of native coronary artery with other forms of angina pectoris: Secondary | ICD-10-CM | POA: Diagnosis not present

## 2023-12-10 LAB — NM PET CT CARDIAC PERFUSION MULTI W/ABSOLUTE BLOODFLOW
LV dias vol: 82 mL (ref 62–150)
MBFR: 2.34
Nuc Rest EF: 63 %
Nuc Stress EF: 66 %
Peak HR: 93 {beats}/min
Rest HR: 64 {beats}/min
Rest MBF: 0.67 ml/g/min
Rest Nuclear Isotope Dose: 25.2 mCi
Rest perfusion cavity size (mL): 82 mL
ST Depression (mm): 0 mm
Stress MBF: 1.57 ml/g/min
Stress Nuclear Isotope Dose: 25.2 mCi
Stress perfusion cavity size (mL): 91 mL
TID: 1.14

## 2023-12-10 MED ORDER — REGADENOSON 0.4 MG/5ML IV SOLN
INTRAVENOUS | Status: AC
  Filled 2023-12-10: qty 5

## 2023-12-10 MED ORDER — REGADENOSON 0.4 MG/5ML IV SOLN
0.4000 mg | Freq: Once | INTRAVENOUS | Status: AC
  Administered 2023-12-10: 0.4 mg via INTRAVENOUS

## 2023-12-10 MED ORDER — RUBIDIUM RB82 GENERATOR (RUBYFILL)
25.1900 | PACK | Freq: Once | INTRAVENOUS | Status: AC
  Administered 2023-12-10: 25.19 via INTRAVENOUS

## 2023-12-10 MED ORDER — RUBIDIUM RB82 GENERATOR (RUBYFILL)
25.2200 | PACK | Freq: Once | INTRAVENOUS | Status: AC
  Administered 2023-12-10: 25.22 via INTRAVENOUS

## 2023-12-16 ENCOUNTER — Other Ambulatory Visit: Payer: Self-pay | Admitting: Family Medicine

## 2023-12-18 NOTE — Telephone Encounter (Signed)
 Reordered 04/01/23 #90 3 RF- pt will needs lab work and has OV 03/2024  Requested Prescriptions  Refused Prescriptions Disp Refills   rosuvastatin  (CRESTOR ) 10 MG tablet [Pharmacy Med Name: ROSUVASTATIN  10MG  TABLETS] 90 tablet 3    Sig: TAKE 1 TABLET(10 MG) BY MOUTH DAILY     Cardiovascular:  Antilipid - Statins 2 Failed - 12/18/2023  8:05 AM      Failed - Valid encounter within last 12 months    Recent Outpatient Visits           1 year ago General medical exam   [facility] Austine Lefort, MD   2 years ago General medical exam   [facility] Austine Lefort, MD   3 years ago Controlled type 2 diabetes mellitus without complication, without long-term current use of insulin  (HCC)   [facility] Austine Lefort, MD   3 years ago Controlled type 2 diabetes mellitus without complication, without long-term current use of insulin  (HCC)   [facility] [name], Cisco Crest, MD   4 years ago URI, acute   [facility] [name], Cisco Crest, MD       Future Appointments             In 3 weeks [name], Cisco Crest, MD [facility], [facility]   In 1 month Clearnce Curia, NP [facility]            Failed - Lipid Panel in normal range within the last 12 months    Cholesterol  Date Value Ref Range Status  03/26/2023 89 <200 mg/dL Final   LDL Cholesterol (Calc)  Date Value Ref Range Status  03/26/2023 33 mg/dL (calc) Final    Comment:    Reference range: <100 . Desirable range <100 mg/dL for primary prevention;   <70 mg/dL for patients with CHD or diabetic patients  with > or = 2 CHD risk factors. Aaron Aas LDL-C is now calculated using the Martin-Hopkins  calculation, which is a validated novel method providing  better accuracy than the Friedewald equation in the  estimation of LDL-C.  Melinda Sprawls et al. Erroll Heard. 8295;621(30): 2061-2068   (http://education.[employer].com/faq/FAQ164)    HDL  Date Value Ref Range Status  03/26/2023 44 > OR = 40 mg/dL Final   Triglycerides  Date Value Ref Range Status  03/26/2023 42 <150 mg/dL Final         Passed - Cr in normal range and within 360 days    Creat  Date Value Ref Range Status  07/08/2023 1.15 0.70 - 1.28 mg/dL Final   Creatinine, Urine  Date Value Ref Range Status  03/26/2023 48 20 - 320 mg/dL Final         Passed - Patient is not pregnant

## 2024-01-03 ENCOUNTER — Other Ambulatory Visit: Payer: Medicare Other

## 2024-01-03 DIAGNOSIS — E7800 Pure hypercholesterolemia, unspecified: Secondary | ICD-10-CM | POA: Diagnosis not present

## 2024-01-03 DIAGNOSIS — E119 Type 2 diabetes mellitus without complications: Secondary | ICD-10-CM

## 2024-01-03 DIAGNOSIS — I2510 Atherosclerotic heart disease of native coronary artery without angina pectoris: Secondary | ICD-10-CM | POA: Diagnosis not present

## 2024-01-03 DIAGNOSIS — I10 Essential (primary) hypertension: Secondary | ICD-10-CM | POA: Diagnosis not present

## 2024-01-04 LAB — CBC WITH DIFFERENTIAL/PLATELET
Absolute Lymphocytes: 1493 {cells}/uL (ref 850–3900)
Absolute Monocytes: 838 {cells}/uL (ref 200–950)
Basophils Absolute: 11 {cells}/uL (ref 0–200)
Basophils Relative: 0.2 %
Eosinophils Absolute: 108 {cells}/uL (ref 15–500)
Eosinophils Relative: 1.9 %
HCT: 41.7 % (ref 38.5–50.0)
Hemoglobin: 13.7 g/dL (ref 13.2–17.1)
MCH: 29.5 pg (ref 27.0–33.0)
MCHC: 32.9 g/dL (ref 32.0–36.0)
MCV: 89.9 fL (ref 80.0–100.0)
MPV: 10.8 fL (ref 7.5–12.5)
Monocytes Relative: 14.7 %
Neutro Abs: 3249 {cells}/uL (ref 1500–7800)
Neutrophils Relative %: 57 %
Platelets: 251 10*3/uL (ref 140–400)
RBC: 4.64 10*6/uL (ref 4.20–5.80)
RDW: 12.4 % (ref 11.0–15.0)
Total Lymphocyte: 26.2 %
WBC: 5.7 10*3/uL (ref 3.8–10.8)

## 2024-01-04 LAB — COMPLETE METABOLIC PANEL WITH GFR
AG Ratio: 1.5 (calc) (ref 1.0–2.5)
ALT: 21 U/L (ref 9–46)
AST: 20 U/L (ref 10–35)
Albumin: 4.2 g/dL (ref 3.6–5.1)
Alkaline phosphatase (APISO): 60 U/L (ref 35–144)
BUN: 20 mg/dL (ref 7–25)
CO2: 28 mmol/L (ref 20–32)
Calcium: 9.4 mg/dL (ref 8.6–10.3)
Chloride: 104 mmol/L (ref 98–110)
Creat: 1.16 mg/dL (ref 0.70–1.28)
Globulin: 2.8 g/dL (ref 1.9–3.7)
Glucose, Bld: 122 mg/dL — ABNORMAL HIGH (ref 65–99)
Potassium: 4.9 mmol/L (ref 3.5–5.3)
Sodium: 140 mmol/L (ref 135–146)
Total Bilirubin: 0.4 mg/dL (ref 0.2–1.2)
Total Protein: 7 g/dL (ref 6.1–8.1)
eGFR: 66 mL/min/{1.73_m2} (ref >=60)

## 2024-01-04 LAB — HEMOGLOBIN A1C
Hgb A1c MFr Bld: 7.2 %{Hb} — ABNORMAL HIGH (ref <5.7)
Mean Plasma Glucose: 160 mg/dL
eAG (mmol/L): 8.9 mmol/L

## 2024-01-04 LAB — LIPID PANEL
Cholesterol: 112 mg/dL (ref <200)
HDL: 44 mg/dL (ref >=40)
LDL Cholesterol (Calc): 53 mg/dL
Non-HDL Cholesterol (Calc): 68 mg/dL (ref <130)
Total CHOL/HDL Ratio: 2.5 (calc) (ref <5.0)
Triglycerides: 71 mg/dL (ref <150)

## 2024-01-08 ENCOUNTER — Other Ambulatory Visit: Payer: Medicare Other

## 2024-01-09 DIAGNOSIS — H5203 Hypermetropia, bilateral: Secondary | ICD-10-CM | POA: Diagnosis not present

## 2024-01-09 LAB — HM DIABETES EYE EXAM

## 2024-01-13 ENCOUNTER — Encounter: Payer: Self-pay | Admitting: Family Medicine

## 2024-01-13 ENCOUNTER — Ambulatory Visit (INDEPENDENT_AMBULATORY_CARE_PROVIDER_SITE_OTHER): Payer: Medicare Other | Admitting: Family Medicine

## 2024-01-13 VITALS — BP 120/64 | HR 74 | Temp 99.2°F | Ht 70.0 in | Wt 207.0 lb

## 2024-01-13 DIAGNOSIS — R413 Other amnesia: Secondary | ICD-10-CM | POA: Diagnosis not present

## 2024-01-13 DIAGNOSIS — E119 Type 2 diabetes mellitus without complications: Secondary | ICD-10-CM

## 2024-01-13 DIAGNOSIS — R5383 Other fatigue: Secondary | ICD-10-CM

## 2024-01-13 DIAGNOSIS — E7800 Pure hypercholesterolemia, unspecified: Secondary | ICD-10-CM | POA: Diagnosis not present

## 2024-01-13 DIAGNOSIS — Z7984 Long term (current) use of oral hypoglycemic drugs: Secondary | ICD-10-CM

## 2024-01-13 MED ORDER — DONEPEZIL HCL 10 MG PO TABS: 10.0000 mg | ORAL_TABLET | Freq: Every day | ORAL | 3 refills | Status: DC

## 2024-01-13 NOTE — Progress Notes (Signed)
 Wt Readings from Last 3 Encounters:  01/13/24 207 lb (93.9 kg)  10/28/23 213 lb 12.8 oz (97 kg)  07/12/23 213 lb (96.6 kg)     Subjective:    Patient ID: Paul Rubio, male    DOB: 27-Oct-1951, 73 y.o.   MRN: [id number]  HPI At the patient's last visit, I added Jardiance  to metformin  because his hemoglobin A1c was elevated at 6.9 and his recent lab work shows that his hemoglobin A1c has risen to 7.2.  The remainder of his labs look excellent.  Please see below.  However the patient seems very tired today.  He is working more than 12 hours a day and is down.  Both the patient and his wife attribute his fatigue to this.  However the patient also is very hesitant to answer any question.  He primarily defers to his wife to answer questions.  I am concerned that this may be a defense mechanism related to his underlying memory loss.  His wife states that his memory loss seems to be worsening.  For instance the patient forgot his jacket while leaving the room today to go get his labs drawn.  His wife states that he has misplaced his wallet this morning.  He often forgets to take his medication.  Patient denies any depression. Immunization History  Administered Date(s) Administered   Fluad Quad(high Dose 65+) 08/27/2019, 09/14/2020, 10/31/2021, 09/28/2022   Fluad Trivalent(High Dose 65+) 09/25/2023   Influenza, High Dose Seasonal PF 09/12/2018   Influenza,inj,Quad PF,6+ Mos 10/21/2013, 09/28/2014, 12/27/2015, 09/27/2016, 10/01/2017   PFIZER(Purple Top)SARS-COV-2 Vaccination 01/25/2020, 02/15/2020, 09/04/2020   Pfizer Covid-19 Vaccine Bivalent Booster 46yrs & up 12/24/2021   Pneumococcal Conjugate-13 12/27/2015   Pneumococcal Polysaccharide-23 12/17/2014   Td 11/21/2012   Tdap 11/21/2012   Zoster Recombinant(Shingrix) 09/06/2019, 11/15/2019   Lab on 01/03/2024  Component Date Value Ref Range Status   WBC 01/03/2024 5.7  3.8 - 10.8 Thousand/uL Final   RBC 01/03/2024 4.64  4.20 - 5.80 Million/uL  Final   Hemoglobin 01/03/2024 13.7  13.2 - 17.1 g/dL Final   HCT 40/98/1191 41.7  38.5 - 50.0 % Final   MCV 01/03/2024 89.9  80.0 - 100.0 fL Final   MCH 01/03/2024 29.5  27.0 - 33.0 pg Final   MCHC 01/03/2024 32.9  32.0 - 36.0 g/dL Final   Comment: For adults, a slight decrease in the calculated MCHC value (in the range of 30 to 32 g/dL) is most likely not clinically significant; however, it should be interpreted with caution in correlation with other red cell parameters and the patient's clinical condition.    RDW 01/03/2024 12.4  11.0 - 15.0 % Final   Platelets 01/03/2024 251  140 - 400 Thousand/uL Final   MPV 01/03/2024 10.8  7.5 - 12.5 fL Final   Neutro Abs 01/03/2024 3,249  1,500 - 7,800 cells/uL Final   Absolute Lymphocytes 01/03/2024 1,493  850 - 3,900 cells/uL Final   Absolute Monocytes 01/03/2024 838  200 - 950 cells/uL Final   Eosinophils Absolute 01/03/2024 108  15 - 500 cells/uL Final   Basophils Absolute 01/03/2024 11  0 - 200 cells/uL Final   Neutrophils Relative % 01/03/2024 57  % Final   Total Lymphocyte 01/03/2024 26.2  % Final   Monocytes Relative 01/03/2024 14.7  % Final   Eosinophils Relative 01/03/2024 1.9  % Final   Basophils Relative 01/03/2024 0.2  % Final   Glucose, Bld 01/03/2024 122 (H)  65 - 99 mg/dL Final  Comment: .            Fasting reference interval . For someone without known diabetes, a glucose value between 100 and 125 mg/dL is consistent with prediabetes and should be confirmed with a follow-up test. .    BUN 01/03/2024 20  7 - 25 mg/dL Final   Creat 29/52/8413 1.16  0.70 - 1.28 mg/dL Final   eGFR 24/40/1027 66  > OR = 60 mL/min/1.80m2 Final   BUN/Creatinine Ratio 01/03/2024 SEE NOTE:  6 - 22 (calc) Final   Comment:    Not Reported: BUN and Creatinine are within    reference range. .    Sodium 01/03/2024 140  135 - 146 mmol/L Final   Potassium 01/03/2024 4.9  3.5 - 5.3 mmol/L Final   Chloride 01/03/2024 104  98 - 110 mmol/L Final    CO2 01/03/2024 28  20 - 32 mmol/L Final   Calcium  01/03/2024 9.4  8.6 - 10.3 mg/dL Final   Total Protein 25/36/6440 7.0  6.1 - 8.1 g/dL Final   Albumin 34/74/2595 4.2  3.6 - 5.1 g/dL Final   Globulin 63/87/5643 2.8  1.9 - 3.7 g/dL (calc) Final   AG Ratio 01/03/2024 1.5  1.0 - 2.5 (calc) Final   Total Bilirubin 01/03/2024 0.4  0.2 - 1.2 mg/dL Final   Alkaline phosphatase (APISO) 01/03/2024 60  35 - 144 U/L Final   AST 01/03/2024 20  10 - 35 U/L Final   ALT 01/03/2024 21  9 - 46 U/L Final   Cholesterol 01/03/2024 112  <200 mg/dL Final   HDL 32/95/1884 44  > OR = 40 mg/dL Final   Triglycerides 16/60/6301 71  <150 mg/dL Final   LDL Cholesterol (Calc) 01/03/2024 53  mg/dL (calc) Final   Comment: Reference range: <100 . Desirable range <100 mg/dL for primary prevention;   <70 mg/dL for patients with CHD or diabetic patients  with > or = 2 CHD risk factors. Aaron Aas LDL-C is now calculated using the Martin-Hopkins  calculation, which is a validated novel method providing  better accuracy than the Friedewald equation in the  estimation of LDL-C.  Melinda Sprawls et al. Erroll Heard. 6010;932(35): 2061-2068  (http://education.[facility].com/faq/FAQ164)    Total CHOL/HDL Ratio 01/03/2024 2.5  <5.7 (calc) Final   Non-HDL Cholesterol (Calc) 01/03/2024 68  <130 mg/dL (calc) Final   Comment: For patients with diabetes plus 1 major ASCVD risk  factor, treating to a non-HDL-C goal of <100 mg/dL  (LDL-C of <32 mg/dL) is considered a therapeutic  option.    Hgb A1c MFr Bld 01/03/2024 7.2 (H)  <5.7 % of total Hgb Final   Comment: For someone without known diabetes, a hemoglobin A1c value of 6.5% or greater indicates that they may have  diabetes and this should be confirmed with a follow-up  test. . For someone with known diabetes, a value <7% indicates  that their diabetes is well controlled and a value  greater than or equal to 7% indicates suboptimal  control. A1c targets should be individualized based on   duration of diabetes, age, comorbid conditions, and  other considerations. . Currently, no consensus exists regarding use of hemoglobin A1c for diagnosis of diabetes for children. .    Mean Plasma Glucose 01/03/2024 160  mg/dL Final   eAG (mmol/L) 20/25/4270 8.9  mmol/L Final   Past Medical History:  Diagnosis Date   Coronary artery disease 2011    2 stents placed   H/O hiatal hernia    Hyperlipidemia  borderline; status post stress test x2, the last one 5-6 years ago and without significant abnormality per the patient   Hypertension    for approximately 5 years   Myocardial infarction [facility]) 2011   Pre-diabetes    Prediabetes    Past Surgical History:  Procedure Laterality Date   CHOLECYSTECTOMY     COLONOSCOPY     CORONARY ANGIOPLASTY     DES to mid and distal RCA 05/15/10   REVERSE SHOULDER ARTHROPLASTY Right 12/06/2020   Procedure: RIGHT REVERSE SHOULDER ARTHROPLASTY;  Surgeon: Jasmine Mesi, MD;  Location: [facility] OR;  Service: Orthopedics;  Laterality: Right;   Right shoulder rotator cuff arthropathy.  12/06/2020   ROTATOR CUFF REPAIR Left 10/20/2013   Dr Rozelle Corning   SHOULDER ARTHROSCOPY WITH OPEN ROTATOR CUFF REPAIR Left 10/20/2013   Procedure: SHOULDER ARTHROSCOPY WITH OPEN ROTATOR CUFF REPAIR;  Surgeon: Jasmine Mesi, MD;  Location: [facility] OR;  Service: Orthopedics;  Laterality: Left;  Left shoulder arthroscopy, debridement, open biceps tenodesis and rotator cuff repair.   Current Outpatient Medications on File Prior to Visit  Medication Sig Dispense Refill   ascorbic acid (VITAMIN C) 500 MG tablet Take 500 mg by mouth daily.     aspirin  EC 81 MG tablet Take 81 mg by mouth daily.     cetirizine (ZYRTEC) 10 MG tablet Take 10 mg by mouth at bedtime.     cholecalciferol (VITAMIN D ) 25 MCG (1000 UNIT) tablet Take 1,000 Units by mouth daily.     empagliflozin  (JARDIANCE ) 10 MG TABS tablet Take 1 tablet (10 mg total) by mouth daily before breakfast. 30 tablet 11    ibuprofen (ADVIL) 200 MG tablet Take 400 mg by mouth every 8 (eight) hours as needed (pain.).     latanoprost (XALATAN) 0.005 % ophthalmic solution Place 1 drop into both eyes at bedtime.     losartan  (COZAAR ) 50 MG tablet TAKE 1 TABLET(50 MG) BY MOUTH DAILY 90 tablet 3   metFORMIN  (GLUCOPHAGE ) 850 MG tablet TAKE 1 TABLET(850 MG) BY MOUTH TWICE DAILY WITH A MEAL 180 tablet 1   metoprolol  tartrate (LOPRESSOR ) 25 MG tablet Take 0.5 tablets (12.5 mg total) by mouth 2 (two) times daily. 90 tablet 3   nitroGLYCERIN  (NITROSTAT ) 0.4 MG SL tablet Place 1 tablet (0.4 mg total) under the tongue every 5 (five) minutes as needed for chest pain. 25 tablet 2   rosuvastatin  (CRESTOR ) 10 MG tablet Take 1 tablet (10 mg total) by mouth daily. 90 tablet 3   Zinc  50 MG TABS Take 50 mg by mouth daily.     APPLE CIDER VINEGAR PO Take 1,200 mg by mouth daily. (Patient not taking: Reported on 01/13/2024)     No current facility-administered medications on file prior to visit.   No Known Allergies Social History   Socioeconomic History   Marital status: Married    Spouse name: Not on file   Number of children: 2   Years of education: Not on file   Highest education level: Some college, no degree  Occupational History   Not on file  Tobacco Use   Smoking status: Former    Current packs/day: 0.00    Types: Cigarettes    Quit date: 12/03/1968    Years since quitting: 55.1   Smokeless tobacco: Never  Vaping Use   Vaping status: Never Used  Substance and Sexual Activity   Alcohol use: No   Drug use: No   Sexual activity: Not on file  Other Topics Concern  Not on file  Social History Narrative   Quit tobacco greater than 20 years ago with approximately 10 pack-year history. Denies alcohol or drug abuse   Social Drivers of Corporate investment banker Strain: Low Risk  (01/10/2024)   Overall Financial Resource Strain (CARDIA)    Difficulty of Paying Living Expenses: Not hard at all  Food Insecurity: No Food  Insecurity (01/10/2024)   Hunger Vital Sign    Worried About Running Out of Food in the Last Year: Never true    Ran Out of Food in the Last Year: Never true  Transportation Needs: No Transportation Needs (01/10/2024)   PRAPARE - Administrator, Civil Service (Medical): No    Lack of Transportation (Non-Medical): No  Physical Activity: Unknown (01/10/2024)   Exercise Vital Sign    Days of Exercise per Week: 0 days    Minutes of Exercise per Session: Not on file  Stress: No Stress Concern Present (01/10/2024)   [employer] of Occupational Health - Occupational Stress Questionnaire    Feeling of Stress : Not at all  Social Connections: Moderately Isolated (01/10/2024)   Social Connection and Isolation Panel [NHANES]    Frequency of Communication with Friends and Family: More than three times a week    Frequency of Social Gatherings with Friends and Family: More than three times a week    Attends Religious Services: Never    Database administrator or Organizations: No    Attends Engineer, structural: Not on file    Marital Status: Married  Catering manager Violence: Not on file   Family History  Problem Relation Age of Onset   Heart disease Father    Heart attack Other    Sudden death Paternal Uncle        possible MI   Colon cancer Neg Hx      Review of Systems  All other systems reviewed and are negative.      Objective:   Physical Exam Vitals reviewed.  Constitutional:      General: He is not in acute distress.    Appearance: He is well-developed. He is not diaphoretic.  HENT:     Head: Normocephalic and atraumatic.     Right Ear: External ear normal.     Left Ear: External ear normal.     Nose: Nose normal.     Mouth/Throat:     Pharynx: No oropharyngeal exudate.  Eyes:     General: No scleral icterus.       Right eye: No discharge.        Left eye: No discharge.     Conjunctiva/sclera: Conjunctivae normal.     Pupils: Pupils are equal,  round, and reactive to light.  Neck:     Thyroid : No thyromegaly.     Vascular: No JVD.     Trachea: No tracheal deviation.  Cardiovascular:     Rate and Rhythm: Normal rate and regular rhythm.     Heart sounds: Normal heart sounds. No murmur heard.    No friction rub. No gallop.  Pulmonary:     Effort: Pulmonary effort is normal. No respiratory distress.     Breath sounds: Normal breath sounds. No stridor. No wheezing or rales.  Chest:     Chest wall: No tenderness.  Abdominal:     General: Bowel sounds are normal. There is no distension.     Palpations: Abdomen is soft. There is no mass.  Tenderness: There is no abdominal tenderness. There is no guarding or rebound.  Musculoskeletal:        General: No tenderness. Normal range of motion.     Cervical back: Normal range of motion and neck supple.  Lymphadenopathy:     Cervical: No cervical adenopathy.  Skin:    General: Skin is warm.     Findings: Rash present.  Neurological:     Mental Status: He is alert and oriented to person, place, and time.     Cranial Nerves: No cranial nerve deficit.     Motor: No abnormal muscle tone.     Coordination: Coordination normal.     Deep Tendon Reflexes: Reflexes are normal and symmetric.  Psychiatric:        Behavior: Behavior normal.        Thought Content: Thought content normal.        Judgment: Judgment normal.      Lab on 01/03/2024  Component Date Value Ref Range Status   WBC 01/03/2024 5.7  3.8 - 10.8 Thousand/uL Final   RBC 01/03/2024 4.64  4.20 - 5.80 Million/uL Final   Hemoglobin 01/03/2024 13.7  13.2 - 17.1 g/dL Final   HCT 16/09/9603 41.7  38.5 - 50.0 % Final   MCV 01/03/2024 89.9  80.0 - 100.0 fL Final   MCH 01/03/2024 29.5  27.0 - 33.0 pg Final   MCHC 01/03/2024 32.9  32.0 - 36.0 g/dL Final   Comment: For adults, a slight decrease in the calculated MCHC value (in the range of 30 to 32 g/dL) is most likely not clinically significant; however, it should  be interpreted with caution in correlation with other red cell parameters and the patient's clinical condition.    RDW 01/03/2024 12.4  11.0 - 15.0 % Final   Platelets 01/03/2024 251  140 - 400 Thousand/uL Final   MPV 01/03/2024 10.8  7.5 - 12.5 fL Final   Neutro Abs 01/03/2024 3,249  1,500 - 7,800 cells/uL Final   Absolute Lymphocytes 01/03/2024 1,493  850 - 3,900 cells/uL Final   Absolute Monocytes 01/03/2024 838  200 - 950 cells/uL Final   Eosinophils Absolute 01/03/2024 108  15 - 500 cells/uL Final   Basophils Absolute 01/03/2024 11  0 - 200 cells/uL Final   Neutrophils Relative % 01/03/2024 57  % Final   Total Lymphocyte 01/03/2024 26.2  % Final   Monocytes Relative 01/03/2024 14.7  % Final   Eosinophils Relative 01/03/2024 1.9  % Final   Basophils Relative 01/03/2024 0.2  % Final   Glucose, Bld 01/03/2024 122 (H)  65 - 99 mg/dL Final   Comment: .            Fasting reference interval . For someone without known diabetes, a glucose value between 100 and 125 mg/dL is consistent with prediabetes and should be confirmed with a follow-up test. .    BUN 01/03/2024 20  7 - 25 mg/dL Final   Creat 54/08/8118 1.16  0.70 - 1.28 mg/dL Final   eGFR 14/78/2956 66  > OR = 60 mL/min/1.35m2 Final   BUN/Creatinine Ratio 01/03/2024 SEE NOTE:  6 - 22 (calc) Final   Comment:    Not Reported: BUN and Creatinine are within    reference range. .    Sodium 01/03/2024 140  135 - 146 mmol/L Final   Potassium 01/03/2024 4.9  3.5 - 5.3 mmol/L Final   Chloride 01/03/2024 104  98 - 110 mmol/L Final   CO2 01/03/2024 28  20 - 32 mmol/L Final   Calcium  01/03/2024 9.4  8.6 - 10.3 mg/dL Final   Total Protein 46/96/2952 7.0  6.1 - 8.1 g/dL Final   Albumin 84/13/2440 4.2  3.6 - 5.1 g/dL Final   Globulin 09/29/2535 2.8  1.9 - 3.7 g/dL (calc) Final   AG Ratio 01/03/2024 1.5  1.0 - 2.5 (calc) Final   Total Bilirubin 01/03/2024 0.4  0.2 - 1.2 mg/dL Final   Alkaline phosphatase (APISO) 01/03/2024 60  35 -  144 U/L Final   AST 01/03/2024 20  10 - 35 U/L Final   ALT 01/03/2024 21  9 - 46 U/L Final   Cholesterol 01/03/2024 112  <200 mg/dL Final   HDL 64/40/3474 44  > OR = 40 mg/dL Final   Triglycerides 25/95/6387 71  <150 mg/dL Final   LDL Cholesterol (Calc) 01/03/2024 53  mg/dL (calc) Final   Comment: Reference range: <100 . Desirable range <100 mg/dL for primary prevention;   <70 mg/dL for patients with CHD or diabetic patients  with > or = 2 CHD risk factors. Aaron Aas LDL-C is now calculated using the Martin-Hopkins  calculation, which is a validated novel method providing  better accuracy than the Friedewald equation in the  estimation of LDL-C.  Melinda Sprawls et al. Erroll Heard. 5643;329(51): 2061-2068  (http://education.[facility].com/faq/FAQ164)    Total CHOL/HDL Ratio 01/03/2024 2.5  <8.8 (calc) Final   Non-HDL Cholesterol (Calc) 01/03/2024 68  <130 mg/dL (calc) Final   Comment: For patients with diabetes plus 1 major ASCVD risk  factor, treating to a non-HDL-C goal of <100 mg/dL  (LDL-C of <41 mg/dL) is considered a therapeutic  option.    Hgb A1c MFr Bld 01/03/2024 7.2 (H)  <5.7 % of total Hgb Final   Comment: For someone without known diabetes, a hemoglobin A1c value of 6.5% or greater indicates that they may have  diabetes and this should be confirmed with a follow-up  test. . For someone with known diabetes, a value <7% indicates  that their diabetes is well controlled and a value  greater than or equal to 7% indicates suboptimal  control. A1c targets should be individualized based on  duration of diabetes, age, comorbid conditions, and  other considerations. . Currently, no consensus exists regarding use of hemoglobin A1c for diagnosis of diabetes for children. .    Mean Plasma Glucose 01/03/2024 160  mg/dL Final   eAG (mmol/L) 66/05/3015 8.9  mmol/L Final        Assessment & Plan:  Other fatigue - Plan: Testosterone  Total,Free,Bio, Males  Memory loss  Controlled  type 2 diabetes mellitus without complication, without long-term current use of insulin  (HCC)  Pure hypercholesterolemia Patient reports fatigue.  We have recently checked his thyroid  and his B12 and they are normal.  His CBC is normal.  I will check a testosterone  level to see if this could be contributing.  However based on his behavior today and his answers to questions, I am concerned that his memory loss is worsening.  I have admitted increasing Aricept  10 mg a day.  Blood sugar is excellent.  Cholesterol is outstanding.  I will not adjust his diabetes medication at the present time.  Instead I recommended that he try to eat more protein and reduce his carb intake

## 2024-01-14 LAB — TESTOSTERONE TOTAL,FREE,BIO, MALES
Albumin: 4.2 g/dL (ref 3.6–5.1)
Sex Hormone Binding: 22 nmol/L (ref 22–77)
Testosterone: 150 ng/dL — ABNORMAL LOW (ref 250–827)

## 2024-01-15 ENCOUNTER — Encounter: Payer: Self-pay | Admitting: Family Medicine

## 2024-01-17 NOTE — Telephone Encounter (Signed)
Copied from CRM [id number]. Topic: General - Call Back - No Documentation >> Jan 17, 2024  1:07 PM Ivette P wrote: Reason for CRM: Pt called in to speak to Marquese Burkland about recent Mychart message about results. Pt has further questions. Attempted to reach CAL 2 times, no answer. Please call pt back [phone]

## 2024-01-20 ENCOUNTER — Telehealth: Payer: Self-pay | Admitting: Family Medicine

## 2024-01-20 ENCOUNTER — Other Ambulatory Visit: Payer: Self-pay | Admitting: Family Medicine

## 2024-01-20 MED ORDER — TESTOSTERONE 1.62 % TD GEL: 2.0000 | Freq: Every day | TRANSDERMAL | 3 refills | Status: DC

## 2024-01-20 NOTE — Telephone Encounter (Signed)
 Paul Rubio

## 2024-01-21 NOTE — Telephone Encounter (Signed)
 Attempted to call pt's wife to give pcp's msg (I sent in testosterone gel 2 pumps daily applied to bare skin and rubbed in) no answer, LVM for pt or wife to return call.

## 2024-01-22 NOTE — Telephone Encounter (Signed)
 Spoke w/pt's wife. Wife is aware that PA-Testosterone gel in processes and will let them know as soon as we hear from insurance re: approval

## 2024-01-23 NOTE — Telephone Encounter (Signed)
 PA-Testosterone 1.62% gel has been sent to plan

## 2024-01-24 DIAGNOSIS — E291 Testicular hypofunction: Secondary | ICD-10-CM | POA: Insufficient documentation

## 2024-01-24 NOTE — Telephone Encounter (Unsigned)
 Copied from CRM [id number]. Topic: General - Other >> Jan 24, 2024 10:36 AM Gildardo Pounds wrote: Reason for CRM: Patient's wife, [name], wants a return call regarding message left on his MyChart today about testosterone medication. Callback number is [phone]

## 2024-01-24 NOTE — Telephone Encounter (Signed)
 FYI:  Outcome  Denied on February 20 by [employer] [place] [facility] 2017 Denied. We denied coverage for this drug because Testosterone gel is not being prescribed for an FDA labeled or medically accepted use. A medically accepted use is approved by the FDA or supported by the [facility] Formulary Service Drug Information and the DRUGDEX Information System. In this case, Testosterone gel is not being prescribed in accordance with an FDA labeled use or use accepted by the Medicare approved drug compendia. Drug Testosterone 1.62% gel ePA cloud logo

## 2024-01-27 ENCOUNTER — Telehealth: Payer: Self-pay

## 2024-01-27 NOTE — Telephone Encounter (Signed)
 Copied from CRM [id number]. Topic: Clinical - Prescription Issue >> Jan 27, 2024 10:53 AM Bobbye Morton wrote: Reason for CRM: Earl Many in to speak to nurse about a Prior Authorization approval for a Testosterone 1.62 % GEL [[id number]]  medication for pt. Attempted to call CAL two times, no answer the first. Second time was put on hold. [employer] disconnected. Was not able to get callback.

## 2024-01-28 ENCOUNTER — Encounter (HOSPITAL_BASED_OUTPATIENT_CLINIC_OR_DEPARTMENT_OTHER): Payer: Self-pay | Admitting: Family

## 2024-01-28 ENCOUNTER — Ambulatory Visit (HOSPITAL_BASED_OUTPATIENT_CLINIC_OR_DEPARTMENT_OTHER): Payer: Medicare Other | Admitting: Family

## 2024-01-28 VITALS — BP 116/70 | HR 67 | Ht 70.0 in | Wt 204.5 lb

## 2024-01-28 DIAGNOSIS — E785 Hyperlipidemia, unspecified: Secondary | ICD-10-CM | POA: Diagnosis not present

## 2024-01-28 DIAGNOSIS — I10 Essential (primary) hypertension: Secondary | ICD-10-CM

## 2024-01-28 DIAGNOSIS — I25118 Atherosclerotic heart disease of native coronary artery with other forms of angina pectoris: Secondary | ICD-10-CM | POA: Diagnosis not present

## 2024-01-28 NOTE — Progress Notes (Signed)
 Cardiology Office Note:  .   Date:  01/28/2024  ID:  Paul Rubio, DOB 03/15/1951, MRN [id number] PCP: Donita Brooks, MD  [facility] Providers Cardiologist:  Tonny Bollman, MD Cardiology APP:  Alver Sorrow, NP    History of Present Illness: .   Paul Rubio is a 73 y.o. male  with a hx of CAD s/p DES x 2 to mid and distal RCA in 2011, mild cognitive decline,  hypertension, hyperlipidemia, DM2.   He presented with NSTEMi 2011 and trested with DES x 2 to mid and distal RCA. He was last seen 10/2020 and doing well from cardiac perspective. Since last seen underwent right reverse shoulder surgery 12/2020.    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 No changes were made.    Seen by PCP 06/29/22 with mild cognitive decline suspected to be vascular dementia. His Aricept was increased. PCP has switched Atorvastatin to Rosuvastatin due to memory concerns.    At visit 10/2023 noted fatigue by end of day. New TWI noted on EKG. Subsequent cardiac PET 12/10/23 with LVEF 63%, no ischemia, low risk study.    He presents today for follow up with his wife.  Doing well since last seen.  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  Eats predominantly at home and does not add much salt to his food. Reviewed PET in detail, reassured by results. 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 Recently started testosterone replacement therapy and hopeful this will improve his energy level.   ROS: Please see the history of present illness.    All other systems reviewed and are negative.   Studies Reviewed: .        Cardiac Studies & Procedures   ______________________________________________________________________________________________   STRESS TESTS  NM PET CT CARDIAC PERFUSION MULTI W/ABSOLUTE BLOODFLOW 12/10/2023  Narrative   LV perfusion is normal. There is no evidence of ischemia. There is no evidence of  infarction.   Rest left ventricular function is normal. Rest EF: 63%. Stress left ventricular function is normal. Stress EF: 66%. End diastolic cavity size is normal.   Myocardial blood flow was computed to be 0.56ml/g/min at rest and 1.86ml/g/min at stress. Global myocardial blood flow reserve was 2.34 and was normal.   Coronary calcium assessment not performed due to prior revascularization.   The study is normal. The study is low risk.  Electronically signed by Lennie Odor, MD __________________________________________________________________________  CLINICAL DATA:  This over-read does not include interpretation of cardiac or coronary anatomy or pathology. The Cardiac PET CT interpretation by the cardiologist is attached.  COMPARISON:  12/07/2020  FINDINGS: Cardiovascular: Scattered aortic atherosclerosis. Normal heart size. Three-vessel coronary artery calcifications. No pericardial effusion.  Limited Mediastinum/Nodes: No enlarged mediastinal, hilar, or axillary lymph nodes. Trachea and esophagus demonstrate no significant findings.  Limited Lungs/Pleura: Unchanged benign nodule of the dependent left lower lobe measuring 0.8 cm, requiring no further follow-up or characterization (series 4, image 63). No pleural effusion or pneumothorax.  Upper Abdomen: No acute abnormality.  Musculoskeletal: No chest wall abnormality. No acute osseous findings.  IMPRESSION: 1. No acute CT findings of the chest. 2. Coronary artery disease.  Aortic Atherosclerosis (ICD10-I70.0).   Electronically Signed By: Jearld Lesch M.D. On: 12/10/2023 10:27            ______________________________________________________________________________________________      Risk Assessment/Calculations:  Physical Exam:   VS:  BP 116/70   Pulse 67   Ht 5\' 10"  (1.778 m)   Wt 204 lb 8 oz (92.8 kg)   SpO2 98%   BMI 29.34 kg/m    Wt Readings from Last 3 Encounters:  01/28/24  204 lb 8 oz (92.8 kg)  01/13/24 207 lb (93.9 kg)  10/28/23 213 lb 12.8 oz (97 kg)    GEN: Well nourished, well developed in no acute distress NECK: No JVD; No carotid bruits CARDIAC: RRR, no murmurs, rubs, gallops RESPIRATORY:  Clear to auscultation without rales, wheezing or rhonchi  ABDOMEN: Soft, non-tender, non-distended EXTREMITIES:  No edema; No deformity   ASSESSMENT AND PLAN: .    CAD s/p DES x 2 to mid and distal RCA 2011 - Cardiac PET 12/2023 low risk study.  GDMT includes aspirin, metoprolol, Rosuvastatin, as needed nitroglycerin. Heart healthy diet and regular cardiovascular exercise encouraged.     HTN - BP well controlled. Continue current antihypertensive regimen.     HLD, LDL goal <70 - 03/2023 LDL 33. Cholesterol numbers at goal.  Continue Rosuvastatin 10mg  daily.    DM2 -follows with PCP.  07/2023 A1c 6.9. Appreciate inclusion of SGLT2 I for cardioprotective benefit.         Dispo: follow up in 1 year  Signed, Alver Sorrow, NP

## 2024-01-28 NOTE — Patient Instructions (Signed)
 Medication Instructions:  Your physician recommends that you continue on your current medications as directed. Please refer to the Current Medication list given to you today.   Follow-Up: At [facility], you and your health needs are our priority.  As part of our continuing mission to provide you with exceptional heart care, we have created designated Provider Care Teams.  These Care Teams include your primary Cardiologist (physician) and Advanced Practice Providers (APPs -  Physician Assistants and Nurse Practitioners) who all work together to provide you with the care you need, when you need it.  We recommend signing up for the patient portal called "MyChart".  Sign up information is provided on this After Visit Summary.  MyChart is used to connect with patients for Virtual Visits (Telemedicine).  Patients are able to view lab/test results, encounter notes, upcoming appointments, etc.  Non-urgent messages can be sent to your provider as well.   To learn more about what you can do with MyChart, go to [email or web address].    Your next appointment:   1 year with Dr. Excell Seltzer or Gillian Shields, NP

## 2024-02-01 ENCOUNTER — Encounter (HOSPITAL_BASED_OUTPATIENT_CLINIC_OR_DEPARTMENT_OTHER): Payer: Self-pay | Admitting: Family

## 2024-02-21 ENCOUNTER — Other Ambulatory Visit: Payer: Self-pay | Admitting: Family Medicine

## 2024-02-21 NOTE — Telephone Encounter (Signed)
 Duplicate request. Last ordered by PCP on 01/13/24 with additional refills. Requested Prescriptions  Pending Prescriptions Disp Refills   donepezil (ARICEPT) 5 MG tablet [Pharmacy Med Name: DONEPEZIL 5MG  TABLETS] 90 tablet 1    Sig: TAKE 1 TABLET(5 MG) BY MOUTH AT BEDTIME     Neurology:  Alzheimer's Agents Failed - 02/21/2024  4:37 PM      Failed - Valid encounter within last 6 months    Recent Outpatient Visits           1 year ago General medical exam   [facility] Donita Brooks, MD   3 years ago General medical exam   [facility] Donita Brooks, MD   3 years ago Controlled type 2 diabetes mellitus without complication, without long-term current use of insulin (HCC)   [facility] Donita Brooks, MD   4 years ago Controlled type 2 diabetes mellitus without complication, without long-term current use of insulin (HCC)   [facility] [name], Priscille Heidelberg, MD   5 years ago URI, acute   [facility] [name], Priscille Heidelberg, MD

## 2024-03-03 ENCOUNTER — Encounter: Payer: Self-pay | Admitting: Family Medicine

## 2024-03-03 ENCOUNTER — Other Ambulatory Visit: Payer: Self-pay | Admitting: Family Medicine

## 2024-03-03 DIAGNOSIS — R413 Other amnesia: Secondary | ICD-10-CM

## 2024-03-29 ENCOUNTER — Other Ambulatory Visit: Payer: Self-pay | Admitting: Family Medicine

## 2024-03-31 ENCOUNTER — Telehealth: Payer: Self-pay

## 2024-03-31 NOTE — Telephone Encounter (Signed)
 Copied from CRM [id number]. Topic: Clinical - Medication Question >> Mar 31, 2024  8:15 AM [name] wrote: Reason for CRM: Paul Rubio, wife, okay to speak with, called regarding medication that was sent in by the pharmacy. Medication is rosuvastatin  (CRESTOR ) 10 MG tablet [Pharmacy Med Name: ROSUVASTATIN  10MG  TABLETS]. Informed her that it does take 3 business days for refill.

## 2024-04-02 ENCOUNTER — Encounter: Payer: Medicare Other | Admitting: Family Medicine

## 2024-04-06 ENCOUNTER — Ambulatory Visit: Admitting: Family Medicine

## 2024-04-06 ENCOUNTER — Encounter: Payer: Self-pay | Admitting: Family Medicine

## 2024-04-06 VITALS — BP 126/62 | HR 66 | Temp 98.6°F | Ht 70.0 in | Wt 205.0 lb

## 2024-04-06 DIAGNOSIS — E119 Type 2 diabetes mellitus without complications: Secondary | ICD-10-CM

## 2024-04-06 DIAGNOSIS — I2510 Atherosclerotic heart disease of native coronary artery without angina pectoris: Secondary | ICD-10-CM | POA: Diagnosis not present

## 2024-04-06 DIAGNOSIS — I10 Essential (primary) hypertension: Secondary | ICD-10-CM | POA: Diagnosis not present

## 2024-04-06 DIAGNOSIS — R7989 Other specified abnormal findings of blood chemistry: Secondary | ICD-10-CM | POA: Diagnosis not present

## 2024-04-06 DIAGNOSIS — Z0001 Encounter for general adult medical examination with abnormal findings: Secondary | ICD-10-CM

## 2024-04-06 DIAGNOSIS — E291 Testicular hypofunction: Secondary | ICD-10-CM | POA: Diagnosis not present

## 2024-04-06 DIAGNOSIS — Z0000 Encounter for general adult medical examination without abnormal findings: Secondary | ICD-10-CM

## 2024-04-06 DIAGNOSIS — Z23 Encounter for immunization: Secondary | ICD-10-CM

## 2024-04-06 DIAGNOSIS — F03A0 Unspecified dementia, mild, without behavioral disturbance, psychotic disturbance, mood disturbance, and anxiety: Secondary | ICD-10-CM

## 2024-04-06 NOTE — Progress Notes (Signed)
 Subjective:    Patient ID: Paul Rubio, male    DOB: 1951/07/14, 73 y.o.   MRN: [id number]  HPI Patient is a very pleasant 73 year old Caucasian gentleman here today with his wife for physical exam.  His last colonoscopy was in 2019 and is not due again until 2028.  At the patient's last office visit, he was complaining of fatigue.  We checked a testosterone  level and found it to be low at 150.  Patient has been on testosterone  replacement now for the last 3 months.  He is seeing no benefit with regards to fatigue.  He would like to recheck a testosterone  level to see if he is absorbing the topical testosterone  cream.  He is also due for prostate cancer screening with PSA.  He continues to suffer memory loss.  His wife feels that is becoming progressive and gradually worsening over time.  He is currently on Aricept  10 mg a day.  He has not tried Namenda.  He has an appointment to see neurology in June for second opinion.  Wife has noticed that he is losing his temper and becoming frustrated more easily.  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  Denies depression or falls. Immunization History  Administered Date(s) Administered   Fluad Quad(high Dose 65+) 08/27/2019, 09/14/2020, 10/31/2021, 09/28/2022   Fluad Trivalent(High Dose 65+) 09/25/2023   Influenza, High Dose Seasonal PF 09/12/2018   Influenza,inj,Quad PF,6+ Mos 10/21/2013, 09/28/2014, 12/27/2015, 09/27/2016, 10/01/2017   PFIZER(Purple Top)SARS-COV-2 Vaccination 01/25/2020, 02/15/2020, 09/04/2020   Pfizer Covid-19 Vaccine Bivalent Booster 62yrs & up 12/24/2021   Pneumococcal Conjugate-13 12/27/2015   Pneumococcal Polysaccharide-23 12/17/2014   Td 11/21/2012   Tdap 11/21/2012   Zoster Recombinant(Shingrix) 09/06/2019, 11/15/2019    Past Medical History:  Diagnosis Date   Coronary artery disease 2011    2 stents placed   H/O hiatal hernia    Hyperlipidemia    borderline; status post stress test x2, the last one 5-6 years ago and  without significant abnormality per the patient   Hypertension    for approximately 5 years   Myocardial infarction [facility]) 2011   Pre-diabetes    Prediabetes    Past Surgical History:  Procedure Laterality Date   CHOLECYSTECTOMY     COLONOSCOPY     CORONARY ANGIOPLASTY     DES to mid and distal RCA 05/15/10   REVERSE SHOULDER ARTHROPLASTY Right 12/06/2020   Procedure: RIGHT REVERSE SHOULDER ARTHROPLASTY;  Surgeon: Jasmine Mesi, MD;  Location: [facility] OR;  Service: Orthopedics;  Laterality: Right;   Right shoulder rotator cuff arthropathy.  12/06/2020   ROTATOR CUFF REPAIR Left 10/20/2013   Dr Rozelle Corning   SHOULDER ARTHROSCOPY WITH OPEN ROTATOR CUFF REPAIR Left 10/20/2013   Procedure: SHOULDER ARTHROSCOPY WITH OPEN ROTATOR CUFF REPAIR;  Surgeon: Jasmine Mesi, MD;  Location: [facility] OR;  Service: Orthopedics;  Laterality: Left;  Left shoulder arthroscopy, debridement, open biceps tenodesis and rotator cuff repair.   Current Outpatient Medications on File Prior to Visit  Medication Sig Dispense Refill   Testosterone  1.62 % GEL Place 2 Pump onto the skin daily. (Patient not taking: Reported on 01/28/2024) 75 g 3   APPLE CIDER VINEGAR PO Take 1,200 mg by mouth daily. (Patient not taking: Reported on 01/13/2024)     ascorbic acid (VITAMIN C) 500 MG tablet Take 500 mg by mouth daily.     aspirin  EC 81 MG tablet Take 81 mg by mouth daily.     cetirizine (ZYRTEC) 10 MG  tablet Take 10 mg by mouth at bedtime.     cholecalciferol (VITAMIN D ) 25 MCG (1000 UNIT) tablet Take 1,000 Units by mouth daily.     donepezil  (ARICEPT ) 10 MG tablet Take 1 tablet (10 mg total) by mouth at bedtime. 90 tablet 3   empagliflozin  (JARDIANCE ) 10 MG TABS tablet Take 1 tablet (10 mg total) by mouth daily before breakfast. 30 tablet 11   ibuprofen (ADVIL) 200 MG tablet Take 400 mg by mouth every 8 (eight) hours as needed (pain.).     latanoprost (XALATAN) 0.005 % ophthalmic solution Place 1 drop into both eyes at bedtime.      losartan  (COZAAR ) 50 MG tablet TAKE 1 TABLET(50 MG) BY MOUTH DAILY 90 tablet 3   metFORMIN  (GLUCOPHAGE ) 850 MG tablet TAKE 1 TABLET(850 MG) BY MOUTH TWICE DAILY WITH A MEAL 180 tablet 1   metoprolol  tartrate (LOPRESSOR ) 25 MG tablet Take 0.5 tablets (12.5 mg total) by mouth 2 (two) times daily. 90 tablet 3   nitroGLYCERIN  (NITROSTAT ) 0.4 MG SL tablet Place 1 tablet (0.4 mg total) under the tongue every 5 (five) minutes as needed for chest pain. 25 tablet 2   Omega-3 Fatty Acids (FISH OIL PO) Take by mouth.     rosuvastatin  (CRESTOR ) 10 MG tablet TAKE 1 TABLET(10 MG) BY MOUTH DAILY 90 tablet 3   Zinc  50 MG TABS Take 50 mg by mouth daily.     No current facility-administered medications on file prior to visit.   No Known Allergies Social History   Socioeconomic History   Marital status: Married    Spouse name: Not on file   Number of children: 2   Years of education: Not on file   Highest education level: Some college, no degree  Occupational History   Not on file  Tobacco Use   Smoking status: Former    Current packs/day: 0.00    Types: Cigarettes    Quit date: 12/03/1968    Years since quitting: 55.3   Smokeless tobacco: Never  Vaping Use   Vaping status: Never Used  Substance and Sexual Activity   Alcohol use: No   Drug use: No   Sexual activity: Not on file  Other Topics Concern   Not on file  Social History Narrative   Quit tobacco greater than 20 years ago with approximately 10 pack-year history. Denies alcohol or drug abuse   Social Drivers of Corporate investment banker Strain: Low Risk  (01/10/2024)   Overall Financial Resource Strain (CARDIA)    Difficulty of Paying Living Expenses: Not hard at all  Food Insecurity: No Food Insecurity (01/10/2024)   Hunger Vital Sign    Worried About Running Out of Food in the Last Year: Never true    Ran Out of Food in the Last Year: Never true  Transportation Needs: No Transportation Needs (01/10/2024)   PRAPARE - Therapist, art (Medical): No    Lack of Transportation (Non-Medical): No  Physical Activity: Unknown (01/10/2024)   Exercise Vital Sign    Days of Exercise per Week: 0 days    Minutes of Exercise per Session: Not on file  Stress: No Stress Concern Present (01/10/2024)   [employer] of Occupational Health - Occupational Stress Questionnaire    Feeling of Stress : Not at all  Social Connections: Moderately Isolated (01/10/2024)   Social Connection and Isolation Panel [NHANES]    Frequency of Communication with Friends and Family: More than three times  a week    Frequency of Social Gatherings with Friends and Family: More than three times a week    Attends Religious Services: Never    Database administrator or Organizations: No    Attends Engineer, structural: Not on file    Marital Status: Married  Catering manager Violence: Not on file   Family History  Problem Relation Age of Onset   Heart disease Father    Heart attack Other    Sudden death Paternal Uncle        possible MI   Colon cancer Neg Hx      Review of Systems  All other systems reviewed and are negative.      Objective:   Physical Exam Vitals reviewed.  Constitutional:      General: He is not in acute distress.    Appearance: He is well-developed. He is not diaphoretic.  HENT:     Head: Normocephalic and atraumatic.     Right Ear: External ear normal.     Left Ear: External ear normal.     Nose: Nose normal.     Mouth/Throat:     Pharynx: No oropharyngeal exudate.  Eyes:     General: No scleral icterus.       Right eye: No discharge.        Left eye: No discharge.     Conjunctiva/sclera: Conjunctivae normal.     Pupils: Pupils are equal, round, and reactive to light.  Neck:     Thyroid : No thyromegaly.     Vascular: No JVD.     Trachea: No tracheal deviation.  Cardiovascular:     Rate and Rhythm: Normal rate and regular rhythm.     Heart sounds: Normal heart sounds. No murmur  heard.    No friction rub. No gallop.  Pulmonary:     Effort: Pulmonary effort is normal. No respiratory distress.     Breath sounds: Normal breath sounds. No stridor. No wheezing or rales.  Chest:     Chest wall: No tenderness.  Abdominal:     General: Bowel sounds are normal. There is no distension.     Palpations: Abdomen is soft. There is no mass.     Tenderness: There is no abdominal tenderness. There is no guarding or rebound.  Musculoskeletal:        General: No tenderness. Normal range of motion.     Cervical back: Normal range of motion and neck supple.  Lymphadenopathy:     Cervical: No cervical adenopathy.  Skin:    General: Skin is warm.     Findings: Rash present.  Neurological:     Mental Status: He is alert and oriented to person, place, and time.     Cranial Nerves: No cranial nerve deficit.     Motor: No abnormal muscle tone.     Coordination: Coordination normal.     Deep Tendon Reflexes: Reflexes are normal and symmetric.  Psychiatric:        Behavior: Behavior normal.        Thought Content: Thought content normal.        Judgment: Judgment normal.           Assessment & Plan:  General medical exam  Benign essential HTN  Atherosclerosis of native coronary artery of native heart without angina pectoris  Controlled type 2 diabetes mellitus without complication, without long-term current use of insulin  (HCC)  Need for vaccination  Low testosterone   Mild dementia, unspecified  dementia type, unspecified whether behavioral, psychotic, or mood disturbance or anxiety (HCC) We discussed adding Namenda.  At the present time, the wife would like to see the neurologist for second opinion.  Continue Aricept .  Check a testosterone  level.  If still low, I would switch to intramuscular testosterone  injections on the chance that he is not absorbing the testosterone  well.  Also check lab work including CBC CMP lipid panel and A1c.  Diabetic foot exam was performed  today and is normal.

## 2024-04-06 NOTE — Progress Notes (Addendum)
 Subjective:   Paul Rubio is a 73 y.o. male who presents for Medicare Annual/Subsequent preventive examination.  Visit Complete: In person  Patient Medicare AWV questionnaire was completed by the patient on 04/06/2024; I have confirmed that all information answered by patient is correct and no changes since this date.  Cardiac Risk Factors include: advanced age (>82men, >46 women);dyslipidemia;hypertension;male gender;sedentary lifestyle     Objective:    Today's Vitals   04/06/24 1359 04/06/24 1403  BP: 126/62 126/62  Pulse: 66 66  Temp: 98.6 F (37 C) 98.6 F (37 C)  SpO2: 96% 96%  Weight: 205 lb (93 kg) 205 lb (93 kg)  Height: 5\' 10"  (1.778 m) 5\' 10"  (1.778 m)   Body mass index is 29.41 kg/m.     04/06/2024    2:15 PM 01/12/2021    8:38 AM 12/06/2020    6:24 AM 11/30/2020    1:13 PM 03/07/2017    8:15 AM 10/21/2013    8:09 AM 10/12/2013   10:24 AM  Advanced Directives  Does Patient Have a Medical Advance Directive? Yes Yes Yes Yes Yes Patient does not have advance directive Patient does not have advance directive;Patient would not like information  Type of Public librarian Power of [place];Living will Healthcare Power of [place];Living will Living will;Healthcare Power of Attorney Living will;Healthcare Power of Attorney Living will    Does patient want to make changes to medical advance directive?  No - Patient declined No - Patient declined No - Patient declined     Copy of Healthcare Power of Attorney in Chart? No - copy requested No - copy requested No - copy requested No - copy requested       Current Medications (verified) Outpatient Encounter Medications as of 04/06/2024  Medication Sig   ascorbic acid (VITAMIN C) 500 MG tablet Take 500 mg by mouth daily.   aspirin  EC 81 MG tablet Take 81 mg by mouth daily.   cetirizine (ZYRTEC) 10 MG tablet Take 10 mg by mouth at bedtime.   cholecalciferol (VITAMIN D ) 25 MCG (1000 UNIT) tablet Take 1,000 Units by  mouth daily.   donepezil  (ARICEPT ) 10 MG tablet Take 1 tablet (10 mg total) by mouth at bedtime.   empagliflozin  (JARDIANCE ) 10 MG TABS tablet Take 1 tablet (10 mg total) by mouth daily before breakfast.   ibuprofen (ADVIL) 200 MG tablet Take 400 mg by mouth every 8 (eight) hours as needed (pain.).   latanoprost (XALATAN) 0.005 % ophthalmic solution Place 1 drop into both eyes at bedtime.   losartan  (COZAAR ) 50 MG tablet TAKE 1 TABLET(50 MG) BY MOUTH DAILY   metFORMIN  (GLUCOPHAGE ) 850 MG tablet TAKE 1 TABLET(850 MG) BY MOUTH TWICE DAILY WITH A MEAL   metoprolol  tartrate (LOPRESSOR ) 25 MG tablet Take 0.5 tablets (12.5 mg total) by mouth 2 (two) times daily.   nitroGLYCERIN  (NITROSTAT ) 0.4 MG SL tablet Place 1 tablet (0.4 mg total) under the tongue every 5 (five) minutes as needed for chest pain.   Omega-3 Fatty Acids (FISH OIL PO) Take by mouth.   rosuvastatin  (CRESTOR ) 10 MG tablet TAKE 1 TABLET(10 MG) BY MOUTH DAILY   Testosterone  1.62 % GEL Place 2 Pump onto the skin daily.   Zinc  50 MG TABS Take 50 mg by mouth daily.   APPLE CIDER VINEGAR PO Take 1,200 mg by mouth daily. (Patient not taking: Reported on 01/13/2024)   No facility-administered encounter medications on file as of 04/06/2024.    Allergies (verified) Patient  has no known allergies.   History: Past Medical History:  Diagnosis Date   Coronary artery disease 2011    2 stents placed   H/O hiatal hernia    Hyperlipidemia    borderline; status post stress test x2, the last one 5-6 years ago and without significant abnormality per the patient   Hypertension    for approximately 5 years   Myocardial infarction [facility]) 2011   Pre-diabetes    Prediabetes    Past Surgical History:  Procedure Laterality Date   CHOLECYSTECTOMY     COLONOSCOPY     CORONARY ANGIOPLASTY     DES to mid and distal RCA 05/15/10   REVERSE SHOULDER ARTHROPLASTY Right 12/06/2020   Procedure: RIGHT REVERSE SHOULDER ARTHROPLASTY;  Surgeon: Jasmine Mesi, MD;  Location: [facility] OR;  Service: Orthopedics;  Laterality: Right;   Right shoulder rotator cuff arthropathy.  12/06/2020   ROTATOR CUFF REPAIR Left 10/20/2013   Dr Rozelle Corning   SHOULDER ARTHROSCOPY WITH OPEN ROTATOR CUFF REPAIR Left 10/20/2013   Procedure: SHOULDER ARTHROSCOPY WITH OPEN ROTATOR CUFF REPAIR;  Surgeon: Jasmine Mesi, MD;  Location[facility];  Service: Orthopedics;  Laterality: Left;  Left shoulder arthroscopy, debridement, open biceps tenodesis and rotator cuff repair.   Family History  Problem Relation Age of Onset   Heart disease Father    Heart attack Other    Sudden death Paternal Uncle        possible MI   Colon cancer Neg Hx    Social History   Socioeconomic History   Marital status: Married    Spouse name: Not on file   Number of children: 2   Years of education: Not on file   Highest education level: Some college, no degree  Occupational History   Not on file  Tobacco Use   Smoking status: Former    Current packs/day: 0.00    Types: Cigarettes    Quit date: 12/03/1968    Years since quitting: 55.4   Smokeless tobacco: Never  Vaping Use   Vaping status: Never Used  Substance and Sexual Activity   Alcohol use: No   Drug use: No   Sexual activity: Not on file  Other Topics Concern   Not on file  Social History Narrative   Quit tobacco greater than 20 years ago with approximately 10 pack-year history. Denies alcohol or drug abuse   Social Drivers of Corporate investment banker Strain: Low Risk  (04/06/2024)   Overall Financial Resource Strain (CARDIA)    Difficulty of Paying Living Expenses: Not hard at all  Food Insecurity: No Food Insecurity (04/06/2024)   Hunger Vital Sign    Worried About Running Out of Food in the Last Year: Never true    Ran Out of Food in the Last Year: Never true  Transportation Needs: No Transportation Needs (04/06/2024)   PRAPARE - Administrator, Civil Service (Medical): No    Lack of Transportation  (Non-Medical): No  Physical Activity: Sufficiently Active (04/06/2024)   Exercise Vital Sign    Days of Exercise per Week: 7 days    Minutes of Exercise per Session: 30 min  Stress: No Stress Concern Present (04/06/2024)   [employer] of Occupational Health - Occupational Stress Questionnaire    Feeling of Stress : Not at all  Social Connections: Socially Integrated (04/06/2024)   Social Connection and Isolation Panel [NHANES]    Frequency of Communication with Friends and Family: More than three times  a week    Frequency of Social Gatherings with Friends and Family: More than three times a week    Attends Religious Services: More than 4 times per year    Active Member of [employer] or Organizations: Yes    Attends Engineer, structural: More than 4 times per year    Marital Status: Married  Recent Concern: Social Connections - Moderately Isolated (01/10/2024)   Social Connection and Isolation Panel [NHANES]    Frequency of Communication with Friends and Family: More than three times a week    Frequency of Social Gatherings with Friends and Family: More than three times a week    Attends Religious Services: Never    Database administrator or Organizations: No    Attends Engineer, structural: Not on file    Marital Status: Married    Tobacco Counseling Counseling given: Not Answered   Clinical Intake:  Pre-visit preparation completed: Yes  Pain : No/denies pain     BMI - recorded: 29.41 Nutritional Status: BMI 25 -29 Overweight Nutritional Risks: None Diabetes: Yes CBG done?: No Did pt. bring in CBG monitor from home?: No  How often do you need to have someone help you when you read instructions, pamphlets, or other written materials from your doctor or pharmacy?: 1 - Never  Interpreter Needed?: No  Information entered by :: mj Khy Pitre, lpn   Activities of Daily Living    04/09/2024    2:40 PM 04/06/2024    2:15 PM  In your present state of health, do you  have any difficulty performing the following activities:  Hearing? 0 0  Vision? 0 0  Difficulty concentrating or making decisions? 0 0  Walking or climbing stairs? 0 0  Dressing or bathing? 0 0  Doing errands, shopping? 0 0  Preparing Food and eating ? N N  Using the Toilet? N N  In the past six months, have you accidently leaked urine? N N  Do you have problems with loss of bowel control? N N  Managing your Medications? N N  Managing your Finances? N N  Housekeeping or managing your Housekeeping? N N    Patient Care Team: Austine Lefort, MD as PCP - General (Family Medicine) Arnoldo Lapping, MD as PCP - Cardiology (Cardiology) [facility], [place], [place] Clearnce Curia, NP as Nurse Practitioner (Cardiology)  Indicate any recent Medical Services you may have received from other than [facility] providers in the past year (date may be approximate).     Assessment:   This is a routine wellness examination for Morell.  Hearing/Vision screen Hearing Screening - Comments:: No hearing issues.  Vision Screening - Comments:: No vision issues. Dr. Geralyn Knee at [facility].    Goals Addressed             This Visit's Progress    Exercise 150 min/wk Moderate Activity       Continue to stay active.        Depression Screen    04/06/2024    2:12 PM 01/13/2024   11:17 AM 09/28/2022   11:22 AM 01/12/2021    8:37 AM 01/08/2020   12:04 PM 01/05/2019   10:42 AM 01/03/2018   10:43 AM  PHQ 2/9 Scores  PHQ - 2 Score 0 0 0 0 0 0 0  PHQ- 9 Score     0      Fall Risk    04/06/2024    2:15 PM 01/13/2024  11:17 AM 03/28/2023    6:00 PM 09/28/2022   11:22 AM 01/12/2021    8:37 AM  Fall Risk   Falls in the past year? 0 0 0 0 0  Number falls in past yr: 0 0  0   Injury with Fall? 0 0  0   Risk for fall due to : No Fall Risks No Fall Risks  No Fall Risks No Fall Risks  Follow up Falls prevention discussed;Falls evaluation completed Falls prevention discussed;Falls evaluation completed   Falls prevention discussed Falls evaluation completed    MEDICARE RISK AT HOME: Medicare Risk at Home Any stairs in or around the home?: Yes If so, are there any without handrails?: Yes Home free of loose throw rugs in walkways, pet beds, electrical cords, etc?: Yes Adequate lighting in your home to reduce risk of falls?: Yes Life alert?: No Use of a cane, walker or w/c?: No Grab bars in the bathroom?: No Shower chair or bench in shower?: No Elevated toilet seat or a handicapped toilet?: No  TIMED UP AND GO:  Was the test performed?  Yes  Length of time to ambulate 10 feet: 8 sec Gait steady and fast without use of assistive device    Cognitive Function:        04/06/2024    2:16 PM  6CIT Screen  What Year? 0 points  What month? 0 points  What time? 0 points  Count back from 20 0 points  Months in reverse 0 points  Repeat phrase 8 points  Total Score 8 points    Immunizations Immunization History  Administered Date(s) Administered   Fluad Quad(high Dose 65+) 08/27/2019, 09/14/2020, 10/31/2021, 09/28/2022   Fluad Trivalent(High Dose 65+) 09/25/2023   Influenza, High Dose Seasonal PF 09/12/2018   Influenza,inj,Quad PF,6+ Mos 10/21/2013, 09/28/2014, 12/27/2015, 09/27/2016, 10/01/2017   PFIZER(Purple Top)SARS-COV-2 Vaccination 01/25/2020, 02/15/2020, 09/04/2020   Pfizer Covid-19 Vaccine Bivalent Booster 43yrs & up 12/24/2021   Pneumococcal Conjugate-13 12/27/2015   Pneumococcal Polysaccharide-23 12/17/2014   Td 11/21/2012   Tdap 11/21/2012   Zoster Recombinant(Shingrix) 09/06/2019, 11/15/2019    TDAP status: Due, Education has been provided regarding the importance of this vaccine. Advised may receive this vaccine at local pharmacy or Health Dept. Aware to provide a copy of the vaccination record if obtained from local pharmacy or Health Dept. Verbalized acceptance and understanding.  Flu Vaccine status: Up to date  Pneumococcal vaccine status: Up to  date  Covid-19 vaccine status: Declined, Education has been provided regarding the importance of this vaccine but patient still declined. Advised may receive this vaccine at local pharmacy or Health Dept.or vaccine clinic. Aware to provide a copy of the vaccination record if obtained from local pharmacy or Health Dept. Verbalized acceptance and understanding.  Qualifies for Shingles Vaccine? Yes   Zostavax completed Yes   Shingrix Completed?: Yes  Screening Tests Health Maintenance  Topic Date Due   COVID-19 Vaccine (5 - 2024-25 season) 08/04/2023   Diabetic kidney evaluation - Urine ACR  03/25/2024   FOOT EXAM  03/31/2024   DTaP/Tdap/Td (3 - Td or Tdap) 01/12/2025 (Originally 11/21/2022)   HEMOGLOBIN A1C  07/02/2024   INFLUENZA VACCINE  07/03/2024   OPHTHALMOLOGY EXAM  01/08/2025   Diabetic kidney evaluation - eGFR measurement  04/06/2025   Medicare Annual Wellness (AWV)  04/06/2025   Colonoscopy  03/08/2027   Pneumonia Vaccine 9+ Years old  Completed   Hepatitis C Screening  Completed   Zoster Vaccines- Shingrix  Completed  HPV VACCINES  Aged Out   Meningococcal B Vaccine  Aged Out    Health Maintenance  Health Maintenance Due  Topic Date Due   COVID-19 Vaccine (5 - 2024-25 season) 08/04/2023   Diabetic kidney evaluation - Urine ACR  03/25/2024   FOOT EXAM  03/31/2024    Colorectal cancer screening: Type of screening: Colonoscopy. Completed 03/07/2017. Repeat every 10 years  Lung Cancer Screening: (Low Dose CT Chest recommended if Age 68-80 years, 20 pack-year currently smoking OR have quit w/in 15years.) does not qualify.   Lung Cancer Screening Referral: N/A  Additional Screening:  Hepatitis C Screening: does qualify; Completed 01/03/2018 Vision Screening: Recommended annual ophthalmology exams for early detection of glaucoma and other disorders of the eye. Is the patient up to date with their annual eye exam?  Yes  Who is the provider or what is the name of the  office in which the patient attends annual eye exams? 2024 If pt is not established with a provider, would they like to be referred to a provider to establish care? No .   Dental Screening: Recommended annual dental exams for proper oral hygiene  Diabetic Foot Exam: Diabetic Foot Exam: Completed 04/06/2024  Community Resource Referral / Chronic Care Management: CRR required this visit?  No   CCM required this visit?  No     Plan:     I have personally reviewed and noted the following in the patient's chart:   Medical and social history Use of alcohol, tobacco or illicit drugs  Current medications and supplements including opioid prescriptions. Patient is not currently taking opioid prescriptions. Functional ability and status Nutritional status Physical activity Advanced directives List of other physicians Hospitalizations, surgeries, and ER visits in previous 12 months Vitals Screenings to include cognitive, depression, and falls Referrals and appointments  In addition, I have reviewed and discussed with patient certain preventive protocols, quality metrics, and best practice recommendations. A written personalized care plan for preventive services as well as general preventive health recommendations were provided to patient.     Verneda Golder, LPN   1/61/0960   After Visit Summary: (In Person-Printed) AVS printed and given to the patient  I have collaborated with the care management provider regarding care management and care coordination activities outlined in this encounter and have reviewed this encounter including documentation in the note and care plan. I am certifying that I agree with the content of this note and encounter as supervising physician.

## 2024-04-07 LAB — COMPLETE METABOLIC PANEL WITHOUT GFR
AG Ratio: 1.5 (calc) (ref 1.0–2.5)
ALT: 14 U/L (ref 9–46)
AST: 15 U/L (ref 10–35)
Albumin: 4.3 g/dL (ref 3.6–5.1)
Alkaline phosphatase (APISO): 58 U/L (ref 35–144)
BUN: 22 mg/dL (ref 7–25)
CO2: 28 mmol/L (ref 20–32)
Calcium: 9.7 mg/dL (ref 8.6–10.3)
Chloride: 104 mmol/L (ref 98–110)
Creat: 1.19 mg/dL (ref 0.70–1.28)
Globulin: 2.8 g/dL (ref 1.9–3.7)
Glucose, Bld: 98 mg/dL (ref 65–99)
Potassium: 4.6 mmol/L (ref 3.5–5.3)
Sodium: 140 mmol/L (ref 135–146)
Total Bilirubin: 0.4 mg/dL (ref 0.2–1.2)
Total Protein: 7.1 g/dL (ref 6.1–8.1)

## 2024-04-07 LAB — CBC WITH DIFFERENTIAL/PLATELET
Absolute Lymphocytes: 2034 {cells}/uL (ref 850–3900)
Absolute Monocytes: 656 {cells}/uL (ref 200–950)
Basophils Absolute: 33 {cells}/uL (ref 0–200)
Basophils Relative: 0.4 %
Eosinophils Absolute: 116 {cells}/uL (ref 15–500)
Eosinophils Relative: 1.4 %
HCT: 42.7 % (ref 38.5–50.0)
Hemoglobin: 14 g/dL (ref 13.2–17.1)
MCH: 28.9 pg (ref 27.0–33.0)
MCHC: 32.8 g/dL (ref 32.0–36.0)
MCV: 88.2 fL (ref 80.0–100.0)
MPV: 10.9 fL (ref 7.5–12.5)
Monocytes Relative: 7.9 %
Neutro Abs: 5461 {cells}/uL (ref 1500–7800)
Neutrophils Relative %: 65.8 %
Platelets: 279 10*3/uL (ref 140–400)
RBC: 4.84 10*6/uL (ref 4.20–5.80)
RDW: 12.3 % (ref 11.0–15.0)
Total Lymphocyte: 24.5 %
WBC: 8.3 10*3/uL (ref 3.8–10.8)

## 2024-04-07 LAB — LIPID PANEL
Cholesterol: 102 mg/dL (ref <200)
HDL: 39 mg/dL — ABNORMAL LOW (ref >=40)
LDL Cholesterol (Calc): 45 mg/dL
Non-HDL Cholesterol (Calc): 63 mg/dL (ref <130)
Total CHOL/HDL Ratio: 2.6 (calc) (ref <5.0)
Triglycerides: 98 mg/dL (ref <150)

## 2024-04-07 LAB — TESTOSTERONE: Testosterone: 270 ng/dL (ref 250–827)

## 2024-04-07 LAB — PSA: PSA: 2.3 ng/mL (ref <=4.00)

## 2024-04-08 DIAGNOSIS — L821 Other seborrheic keratosis: Secondary | ICD-10-CM | POA: Diagnosis not present

## 2024-04-08 DIAGNOSIS — L570 Actinic keratosis: Secondary | ICD-10-CM | POA: Diagnosis not present

## 2024-04-08 DIAGNOSIS — D2262 Melanocytic nevi of left upper limb, including shoulder: Secondary | ICD-10-CM | POA: Diagnosis not present

## 2024-04-08 DIAGNOSIS — Z85828 Personal history of other malignant neoplasm of skin: Secondary | ICD-10-CM | POA: Diagnosis not present

## 2024-04-08 DIAGNOSIS — D225 Melanocytic nevi of trunk: Secondary | ICD-10-CM | POA: Diagnosis not present

## 2024-04-21 ENCOUNTER — Other Ambulatory Visit: Payer: Self-pay | Admitting: Family Medicine

## 2024-04-21 MED ORDER — MEMANTINE HCL ER 14 MG PO CP24: 14.0000 mg | ORAL_CAPSULE | Freq: Every day | ORAL | 2 refills | Status: DC

## 2024-05-20 ENCOUNTER — Other Ambulatory Visit: Payer: Self-pay | Admitting: Family Medicine

## 2024-05-25 ENCOUNTER — Ambulatory Visit: Admitting: Neurology

## 2024-05-25 ENCOUNTER — Encounter: Payer: Self-pay | Admitting: Neurology

## 2024-05-25 VITALS — BP 125/67 | HR 62 | Resp 16 | Ht 70.0 in | Wt 206.0 lb

## 2024-05-25 DIAGNOSIS — G3184 Mild cognitive impairment, so stated: Secondary | ICD-10-CM

## 2024-05-25 NOTE — Patient Instructions (Addendum)
 Continue with Aricept  10 mg nightly Continue with Namenda  XR 40 mg daily Continue other medication Will obtain dementia lab including B12, TSH and ATN profile Repeat head CT Continue to follow with PCP and return as needed.    There are well-accepted and sensible ways to reduce risk for Alzheimers disease and other degenerative brain disorders .  Exercise Daily Walk A daily 20 minute walk should be part of your routine. Disease related apathy can be a significant roadblock to exercise and the only way to overcome this is to make it a daily routine and perhaps have a reward at the end (something your loved one loves to eat or drink perhaps) or a personal trainer coming to the home can also be very useful. Most importantly, the patient is much more likely to exercise if the caregiver / spouse does it with him/her. In general a structured, repetitive schedule is best.  General Health: Any diseases which effect your body will effect your brain such as a pneumonia, urinary infection, blood clot, heart attack or stroke. Keep contact with your primary care doctor for regular follow ups.  Sleep. A good nights sleep is healthy for the brain. Seven hours is recommended. If you have insomnia or poor sleep habits we can give you some instructions. If you have sleep apnea wear your mask.  Diet: Eating a heart healthy diet is also a good idea; fish and poultry instead of red meat, nuts (mostly non-peanuts), vegetables, fruits, olive oil or canola oil (instead of butter), minimal salt (use other spices to flavor foods), whole grain rice, bread, cereal and pasta and wine in moderation.Research is now showing that the MIND diet, which is a combination of The Mediterranean diet and the DASH diet, is beneficial for cognitive processing and longevity. Information about this diet can be found in The MIND Diet, a book by Annitta Feeling, MS, RDN, and online at [employer]  Finances, Power  of [address] and Advance Directives: You should consider putting legal safeguards in place with regard to financial and medical decision making. While the spouse always has power of attorney for medical and financial issues in the absence of any form, you should consider what you want in case the spouse / caregiver is no longer around or capable of making decisions.

## 2024-05-25 NOTE — Progress Notes (Signed)
 [facility]  PATIENT: Paul Rubio DOB: 02/14/1951  REQUESTING CLINICIAN: Duanne Butler DASEN, MD HISTORY FROM: Patient and spouse  REASON FOR VISIT: Memory loss    HISTORICAL  CHIEF COMPLAINT:  Chief Complaint  Patient presents with   New Patient (Initial Visit)    Rm13, wife present, NP Internal referral for worsening memory loss: mmse score of 23    HISTORY OF PRESENT ILLNESS:  This is a 73 year old gentleman past medical history hypertension, hyperlipidemia, diabetes mellitus, memory loss who is presenting for management of worsening memory loss.  Patient tells me his memory is fine, sometimes he does not remember and sometimes he has word finding difficulty but overall he does not feel like his memory loss is a problem, at least to him.   Wife tells me that patient has been struggling with short-term memory, he is more forgetful, sometimes he will forget what he had for lunch.  He forget to take his medicine therefore wife has to help him.  He still work in his dairy, works everyday, wife tells me that he is doing his job well, PCP has started him on Aricept  and Namenda , they deny any side effect.  Again the memory loss is reported by wife as short-term memory and periods of repetitiveness.    TBI:   No past history of TBI Stroke:   no past history of stroke Seizures:   no past history of seizures Sleep:   no history of sleep apnea.  Mood: patient denies anxiety and depression Family history of Dementia:  Both parents in their mid 13s Denies  Functional status: independent in all ADLs and IADLs Patient lives with spouse. Cooking: no Cleaning: yes Shopping: no Bathing: no issues Toileting: no issues Driving: no issues Bills: no issues  Medications: wife helps Ever left the stove on by accident?: n/a Forget how to use items around the house?: denies Getting lost going to familiar places?: denies Forgetting loved ones names?: denies Word finding  difficulty? Denies  Sleep: Good    OTHER MEDICAL CONDITIONS: Hypertension, Hyperlipidemia, Diabetes, Memory loss   REVIEW OF SYSTEMS: Full 14 system review of systems performed and negative with exception of: as noted in the HPI   ALLERGIES: No Known Allergies  HOME MEDICATIONS: Outpatient Medications Prior to Visit  Medication Sig Dispense Refill   aspirin  EC 81 MG tablet Take 81 mg by mouth daily.     cetirizine (ZYRTEC) 10 MG tablet Take 10 mg by mouth at bedtime.     cholecalciferol (VITAMIN D ) 25 MCG (1000 UNIT) tablet Take 1,000 Units by mouth daily.     donepezil  (ARICEPT ) 10 MG tablet Take 1 tablet (10 mg total) by mouth at bedtime. 90 tablet 3   empagliflozin  (JARDIANCE ) 10 MG TABS tablet Take 1 tablet (10 mg total) by mouth daily before breakfast. 30 tablet 11   ibuprofen (ADVIL) 200 MG tablet Take 400 mg by mouth every 8 (eight) hours as needed (pain.).     latanoprost (XALATAN) 0.005 % ophthalmic solution Place 1 drop into both eyes at bedtime.     losartan  (COZAAR ) 50 MG tablet TAKE 1 TABLET(50 MG) BY MOUTH DAILY 90 tablet 3   memantine  (NAMENDA  XR) 14 MG CP24 24 hr capsule Take 1 capsule (14 mg total) by mouth daily. 30 capsule 2   metFORMIN  (GLUCOPHAGE ) 850 MG tablet TAKE 1 TABLET(850 MG) BY MOUTH TWICE DAILY WITH A MEAL 180 tablet 1   metoprolol  tartrate (LOPRESSOR ) 25 MG tablet Take 0.5  tablets (12.5 mg total) by mouth 2 (two) times daily. 90 tablet 3   nitroGLYCERIN  (NITROSTAT ) 0.4 MG SL tablet Place 1 tablet (0.4 mg total) under the tongue every 5 (five) minutes as needed for chest pain. 25 tablet 2   Omega-3 Fatty Acids (FISH OIL PO) Take by mouth.     rosuvastatin  (CRESTOR ) 10 MG tablet TAKE 1 TABLET(10 MG) BY MOUTH DAILY 90 tablet 3   APPLE CIDER VINEGAR PO Take 1,200 mg by mouth daily. (Patient not taking: Reported on 01/13/2024)     ascorbic acid (VITAMIN C) 500 MG tablet Take 500 mg by mouth daily.     Testosterone  1.62 % GEL Place 2 Pump onto the skin daily. 75  g 3   Zinc  50 MG TABS Take 50 mg by mouth daily.     No facility-administered medications prior to visit.    PAST MEDICAL HISTORY: Past Medical History:  Diagnosis Date   Coronary artery disease 2011    2 stents placed   H/O hiatal hernia    Hyperlipidemia    borderline; status post stress test x2, the last one 5-6 years ago and without significant abnormality per the patient   Hypertension    for approximately 5 years   Myocardial infarction [facility]) 2011   Pre-diabetes    Prediabetes     PAST SURGICAL HISTORY: Past Surgical History:  Procedure Laterality Date   CHOLECYSTECTOMY     COLONOSCOPY     CORONARY ANGIOPLASTY     DES to mid and distal RCA 05/15/10   REVERSE SHOULDER ARTHROPLASTY Right 12/06/2020   Procedure: RIGHT REVERSE SHOULDER ARTHROPLASTY;  Surgeon: Addie Cordella Hamilton, MD;  Location: [facility] OR;  Service: Orthopedics;  Laterality: Right;   Right shoulder rotator cuff arthropathy.  12/06/2020   ROTATOR CUFF REPAIR Left 10/20/2013   Dr Addie   SHOULDER ARTHROSCOPY WITH OPEN ROTATOR CUFF REPAIR Left 10/20/2013   Procedure: SHOULDER ARTHROSCOPY WITH OPEN ROTATOR CUFF REPAIR;  Surgeon: Cordella Hamilton Addie, MD;  Location: [facility] OR;  Service: Orthopedics;  Laterality: Left;  Left shoulder arthroscopy, debridement, open biceps tenodesis and rotator cuff repair.    FAMILY HISTORY: Family History  Problem Relation Age of Onset   Heart disease Father    Heart attack Other    Sudden death Paternal Uncle        possible MI   Colon cancer Neg Hx     SOCIAL HISTORY: Social History   Socioeconomic History   Marital status: Married    Spouse name: Not on file   Number of children: 2   Years of education: Not on file   Highest education level: Some college, no degree  Occupational History   Not on file  Tobacco Use   Smoking status: Former    Current packs/day: 0.00    Types: Cigarettes    Quit date: 12/03/1968    Years since quitting: 55.5   Smokeless tobacco: Never   Vaping Use   Vaping status: Never Used  Substance and Sexual Activity   Alcohol use: No   Drug use: No   Sexual activity: Not on file  Other Topics Concern   Not on file  Social History Narrative   Quit tobacco greater than 20 years ago with approximately 10 pack-year history. Denies alcohol or drug abuse   Social Drivers of Corporate investment banker Strain: Low Risk  (04/06/2024)   Overall Financial Resource Strain (CARDIA)    Difficulty of Paying Living Expenses: Not hard at  all  Food Insecurity: No Food Insecurity (04/06/2024)   Hunger Vital Sign    Worried About Running Out of Food in the Last Year: Never true    Ran Out of Food in the Last Year: Never true  Transportation Needs: No Transportation Needs (04/06/2024)   PRAPARE - Administrator, Civil Service (Medical): No    Lack of Transportation (Non-Medical): No  Physical Activity: Sufficiently Active (04/06/2024)   Exercise Vital Sign    Days of Exercise per Week: 7 days    Minutes of Exercise per Session: 30 min  Stress: No Stress Concern Present (04/06/2024)   [employer] of Occupational Health - Occupational Stress Questionnaire    Feeling of Stress : Not at all  Social Connections: Socially Integrated (04/06/2024)   Social Connection and Isolation Panel    Frequency of Communication with Friends and Family: More than three times a week    Frequency of Social Gatherings with Friends and Family: More than three times a week    Attends Religious Services: More than 4 times per year    Active Member of [employer] or Organizations: Yes    Attends Engineer, structural: More than 4 times per year    Marital Status: Married  Recent Concern: Social Connections - Moderately Isolated (01/10/2024)   Social Connection and Isolation Panel    Frequency of Communication with Friends and Family: More than three times a week    Frequency of Social Gatherings with Friends and Family: More than three times a week     Attends Religious Services: Never    Database administrator or Organizations: No    Attends Engineer, structural: Not on file    Marital Status: Married  Catering manager Violence: Not At Risk (04/06/2024)   Humiliation, Afraid, Rape, and Kick questionnaire    Fear of Current or Ex-Partner: No    Emotionally Abused: No    Physically Abused: No    Sexually Abused: No    PHYSICAL EXAM  GENERAL EXAM/CONSTITUTIONAL: Vitals:  Vitals:   05/25/24 1408  BP: 125/67  Pulse: 62  Resp: 16  SpO2: 96%  Weight: 206 lb (93.4 kg)  Height: 5' 10 (1.778 m)   Body mass index is 29.56 kg/m. Wt Readings from Last 3 Encounters:  05/25/24 206 lb (93.4 kg)  04/06/24 205 lb (93 kg)  01/28/24 204 lb 8 oz (92.8 kg)   Patient is in no distress; well developed, nourished and groomed; neck is supple  MUSCULOSKELETAL: Gait, strength, tone, movements noted in Neurologic exam below  NEUROLOGIC: MENTAL STATUS:     05/25/2024    2:10 PM  MMSE - Mini Mental State Exam  Orientation to time 2  Orientation to Place 5  Registration 3  Attention/ Calculation 5  Recall 0  Language- name 2 objects 2  Language- repeat 1  Language- follow 3 step command 3  Language- read & follow direction 1  Write a sentence 1  Copy design 0  Total score 23    CRANIAL NERVE:  2nd, 3rd, 4th, 6th- visual fields full to confrontation, extraocular muscles intact, no nystagmus 5th - facial sensation symmetric 7th - facial strength symmetric 8th - hearing intact 9th - palate elevates symmetrically, uvula midline 11th - shoulder shrug symmetric 12th - tongue protrusion midline  MOTOR:  normal bulk and tone, full strength in the BUE, BLE  SENSORY:  normal and symmetric to light touch  COORDINATION:  finger-nose-finger, fine finger movements  normal  GAIT/STATION:  normal   DIAGNOSTIC DATA (LABS, IMAGING, TESTING) - I reviewed patient records, labs, notes, testing and imaging myself where  available.  Lab Results  Component Value Date   WBC 8.3 04/06/2024   HGB 14.0 04/06/2024   HCT 42.7 04/06/2024   MCV 88.2 04/06/2024   PLT 279 04/06/2024      Component Value Date/Time   NA 140 04/06/2024 1440   K 4.6 04/06/2024 1440   CL 104 04/06/2024 1440   CO2 28 04/06/2024 1440   GLUCOSE 98 04/06/2024 1440   BUN 22 04/06/2024 1440   CREATININE 1.19 04/06/2024 1440   CALCIUM  9.7 04/06/2024 1440   PROT 7.1 04/06/2024 1440   ALBUMIN 3.8 12/28/2016 0830   AST 15 04/06/2024 1440   ALT 14 04/06/2024 1440   ALKPHOS 61 12/28/2016 0830   BILITOT 0.4 04/06/2024 1440   GFRNONAA 77 01/06/2021 0912   GFRAA 90 01/06/2021 0912   Lab Results  Component Value Date   CHOL 102 04/06/2024   HDL 39 (L) 04/06/2024   LDLCALC 45 04/06/2024   TRIG 98 04/06/2024   CHOLHDL 2.6 04/06/2024   Lab Results  Component Value Date   HGBA1C 7.2 (H) 01/03/2024   Lab Results  Component Value Date   VITAMINB12 338 03/26/2023   Lab Results  Component Value Date   TSH 1.57 03/27/2022    MRI Brain 04/14/2022 Intermittently motion degraded examination. No evidence of acute intracranial abnormality.  Mild generalized cerebral volume loss. Minimal chronic small vessel ischemic changes within the cerebral white matter. Small chronic lacunar infarct within the right basal ganglia  Mild mucosal thickening within the bilateral ethmoid and maxillary sinuses    ASSESSMENT AND PLAN  73 y.o. year old male with history of hypertension, hyperlipidemia, diabetes mellitus, who is presenting with wife with complaint of memory loss described as short-term memory loss, repeating himself.  On exam patient scored 23 out of 30 on MMSE indicative of impairment.  He is already on Aricept  and Namenda , advised them to continue current medications.  I do suspect the patient is on the cusp of mild cognitive impairment versus mild dementia.  Will obtain dementia lab including B12, TSH and ATN profile.  I will also  repeat his head CT.  I will contact him to go over the results otherwise advised him to continue following up with Dr. Duanne.  If his symptoms do get worse, if he is having more agitation, hallucinations, then they can return as needed.  Both patient and wife are comfortable with plans.   1. Mild cognitive impairment      Patient Instructions  Continue with Aricept  10 mg nightly Continue with Namenda  XR 40 mg daily Continue other medication Will obtain dementia lab including B12, TSH and ATN profile Repeat head CT Continue to follow with PCP and return as needed.    There are well-accepted and sensible ways to reduce risk for Alzheimers disease and other degenerative brain disorders .  Exercise Daily Walk A daily 20 minute walk should be part of your routine. Disease related apathy can be a significant roadblock to exercise and the only way to overcome this is to make it a daily routine and perhaps have a reward at the end (something your loved one loves to eat or drink perhaps) or a personal trainer coming to the home can also be very useful. Most importantly, the patient is much more likely to exercise if the caregiver / spouse does it with  him/her. In general a structured, repetitive schedule is best.  General Health: Any diseases which effect your body will effect your brain such as a pneumonia, urinary infection, blood clot, heart attack or stroke. Keep contact with your primary care doctor for regular follow ups.  Sleep. A good nights sleep is healthy for the brain. Seven hours is recommended. If you have insomnia or poor sleep habits we can give you some instructions. If you have sleep apnea wear your mask.  Diet: Eating a heart healthy diet is also a good idea; fish and poultry instead of red meat, nuts (mostly non-peanuts), vegetables, fruits, olive oil or canola oil (instead of butter), minimal salt (use other spices to flavor foods), whole grain rice, bread, cereal and pasta and  wine in moderation.Research is now showing that the MIND diet, which is a combination of The Mediterranean diet and the DASH diet, is beneficial for cognitive processing and longevity. Information about this diet can be found in The MIND Diet, a book by Annitta Feeling, MS, RDN, and online at [employer]Wild[employer].es  Finances, Power of [address] and Advance Directives: You should consider putting legal safeguards in place with regard to financial and medical decision making. While the spouse always has power of attorney for medical and financial issues in the absence of any form, you should consider what you want in case the spouse / caregiver is no longer around or capable of making decisions.   Orders Placed This Encounter  Procedures   CT HEAD WO CONTRAST ( )   Vitamin B12   TSH   ATN PROFILE    No orders of the defined types were placed in this encounter.   Return if symptoms worsen or fail to improve.  I personally spent a total of 65 minutes in the care of the patient today including preparing to see the patient, getting/reviewing separately obtained history, performing a medically appropriate exam/evaluation, counseling and educating, placing orders, documenting clinical information in the EHR, and communicating results.   Pastor Falling, MD 05/25/2024, 4:57 PM  [facility] [address], [address] [place], [place] [address] [phone]

## 2024-05-26 ENCOUNTER — Telehealth: Payer: Self-pay | Admitting: Neurology

## 2024-05-26 NOTE — Telephone Encounter (Signed)
 [employer] medicare shara: [id number] exp. 05/26/24-06/24/24 sent to GI [phone]

## 2024-05-28 LAB — ATN PROFILE
A -- Beta-amyloid 42/40 Ratio: 0.101 — ABNORMAL LOW (ref >0.102)
Beta-amyloid 40: 184.24 pg/mL
Beta-amyloid 42: 18.69 pg/mL
N -- NfL, Plasma: 4.7 pg/mL (ref 0.00–6.04)
T -- p-tau181: 2.55 pg/mL — ABNORMAL HIGH (ref 0.00–0.97)

## 2024-05-28 LAB — TSH: TSH: 1.32 u[IU]/mL (ref 0.450–4.500)

## 2024-05-28 LAB — VITAMIN B12: Vitamin B-12: 406 pg/mL (ref 232–1245)

## 2024-06-03 ENCOUNTER — Ambulatory Visit
Admission: RE | Admit: 2024-06-03 | Discharge: 2024-06-03 | Disposition: A | Discharge status: Home / Self Care | Source: Ambulatory Visit | Attending: Neurology | Admitting: Neurology

## 2024-06-03 DIAGNOSIS — I6782 Cerebral ischemia: Secondary | ICD-10-CM | POA: Diagnosis not present

## 2024-06-03 DIAGNOSIS — G3184 Mild cognitive impairment, so stated: Secondary | ICD-10-CM | POA: Diagnosis not present

## 2024-06-03 DIAGNOSIS — R413 Other amnesia: Secondary | ICD-10-CM | POA: Diagnosis not present

## 2024-06-03 DIAGNOSIS — G319 Degenerative disease of nervous system, unspecified: Secondary | ICD-10-CM | POA: Diagnosis not present

## 2024-06-08 ENCOUNTER — Ambulatory Visit: Payer: Self-pay | Admitting: Neurology

## 2024-06-08 NOTE — Progress Notes (Signed)
 Please call the patient and inform him the recent labs we check showed presence of Alzheimer disease biomarkers. His diagnosis is Mild cognitive impairment due to Alzheimer disease. Advised him to continue with current medications including Aricept  and Namenda . Please keep any upcoming appointments or tests and call us  with any interim questions, concerns, problems or updates. Thanks,   Pastor Falling, MD

## 2024-06-11 ENCOUNTER — Ambulatory Visit

## 2024-06-21 ENCOUNTER — Other Ambulatory Visit: Payer: Self-pay | Admitting: Family Medicine

## 2024-06-23 NOTE — Telephone Encounter (Signed)
 Prescription Request  06/23/2024  LOV: 04/06/2024  What is the name of the medication or equipment?   memantine  (NAMENDA  XR) 14 MG CP24 24 hr capsule  **2nd request**  Have you contacted your pharmacy to request a refill? Yes   Which pharmacy would you like this sent to?  [id number] - [place], [place] - [id number] [place] [address] Phone: [phone] Fax: [phone]    Patient notified that their request is being sent to the clinical staff for review and that they should receive a response within 2 business days.   Please advise pharmacist.

## 2024-06-23 NOTE — Telephone Encounter (Signed)
 Requested Prescriptions  Pending Prescriptions Disp Refills   memantine  (NAMENDA  XR) 14 MG CP24 24 hr capsule [Pharmacy Med Name: MEMANTINE  ER 14MG  CAPSULES] 90 capsule 1    Sig: TAKE 1 CAPSULE(14 MG) BY MOUTH DAILY     Neurology:  Alzheimer's Agents 2 Passed - 06/23/2024  2:17 PM      Passed - Cr in normal range and within 360 days    Creat  Date Value Ref Range Status  04/06/2024 1.19 0.70 - 1.28 mg/dL Final   Creatinine, Urine  Date Value Ref Range Status  03/26/2023 48 20 - 320 mg/dL Final         Passed - eGFR is 5 or above and within 360 days    GFR, Est African American  Date Value Ref Range Status  01/06/2021 90 > OR = 60 mL/min/1.37m2 Final   GFR, Est Non African American  Date Value Ref Range Status  01/06/2021 77 > OR = 60 mL/min/1.67m2 Final   GFR  Date Value Ref Range Status  07/21/2013 73.89 >60.00 mL/min Final   eGFR  Date Value Ref Range Status  01/03/2024 66 > OR = 60 mL/min/1.17m2 Final         Passed - Valid encounter within last 6 months    Recent Outpatient Visits           2 months ago General medical exam   [place] [facility] Duanne Butler DASEN, MD   5 months ago Other fatigue   [place] [facility] Duanne, Butler DASEN, MD   11 months ago Memory loss   [place] [facility] [name], Butler DASEN, MD   1 year ago General medical exam   [place] [facility] Duanne Butler DASEN, MD   1 year ago Controlled type 2 diabetes mellitus without complication, without long-term current use of insulin  [facility])   [place] [facility] [name], Butler DASEN, MD

## 2024-07-02 ENCOUNTER — Ambulatory Visit

## 2024-07-04 ENCOUNTER — Other Ambulatory Visit: Payer: Self-pay | Admitting: Family Medicine

## 2024-07-08 DIAGNOSIS — H40023 Open angle with borderline findings, high risk, bilateral: Secondary | ICD-10-CM | POA: Diagnosis not present

## 2024-08-14 ENCOUNTER — Other Ambulatory Visit (HOSPITAL_BASED_OUTPATIENT_CLINIC_OR_DEPARTMENT_OTHER): Payer: Self-pay | Admitting: Family

## 2024-08-14 DIAGNOSIS — I25118 Atherosclerotic heart disease of native coronary artery with other forms of angina pectoris: Secondary | ICD-10-CM

## 2024-09-09 ENCOUNTER — Ambulatory Visit

## 2024-09-10 ENCOUNTER — Ambulatory Visit (INDEPENDENT_AMBULATORY_CARE_PROVIDER_SITE_OTHER)

## 2024-09-10 DIAGNOSIS — Z23 Encounter for immunization: Secondary | ICD-10-CM | POA: Diagnosis not present

## 2024-09-10 NOTE — Progress Notes (Signed)
 Patient is in office today for a nurse visit for Immunization. Patient Injection was given in the  Left deltoid. Patient tolerated injection well.

## 2024-09-30 DIAGNOSIS — K080 Exfoliation of teeth due to systemic causes: Secondary | ICD-10-CM | POA: Diagnosis not present

## 2024-10-12 DIAGNOSIS — Z85828 Personal history of other malignant neoplasm of skin: Secondary | ICD-10-CM | POA: Diagnosis not present

## 2024-10-12 DIAGNOSIS — L814 Other melanin hyperpigmentation: Secondary | ICD-10-CM | POA: Diagnosis not present

## 2024-10-12 DIAGNOSIS — L821 Other seborrheic keratosis: Secondary | ICD-10-CM | POA: Diagnosis not present

## 2024-10-12 DIAGNOSIS — L570 Actinic keratosis: Secondary | ICD-10-CM | POA: Diagnosis not present

## 2024-10-22 ENCOUNTER — Encounter: Payer: Self-pay | Admitting: Family Medicine

## 2024-10-22 ENCOUNTER — Ambulatory Visit (INDEPENDENT_AMBULATORY_CARE_PROVIDER_SITE_OTHER): Admitting: Family Medicine

## 2024-10-22 VITALS — BP 132/80 | HR 50 | Temp 98.5°F | Ht 70.0 in | Wt 207.0 lb

## 2024-10-22 DIAGNOSIS — M545 Low back pain, unspecified: Secondary | ICD-10-CM | POA: Diagnosis not present

## 2024-10-22 LAB — URINALYSIS, ROUTINE W REFLEX MICROSCOPIC
Bacteria, UA: NONE SEEN /HPF
Bilirubin Urine: NEGATIVE
Hgb urine dipstick: NEGATIVE
Hyaline Cast: NONE SEEN /LPF
Leukocytes,Ua: NEGATIVE
Nitrite: NEGATIVE
RBC / HPF: NONE SEEN /HPF (ref 0–2)
Specific Gravity, Urine: 1.02 (ref 1.001–1.035)
Squamous Epithelial / HPF: NONE SEEN /HPF (ref <=5)
WBC, UA: NONE SEEN /HPF (ref 0–5)
pH: 5.5 (ref 5.0–8.0)

## 2024-10-22 LAB — MICROSCOPIC MESSAGE

## 2024-10-22 NOTE — Progress Notes (Signed)
 Wt Readings from Last 3 Encounters:  10/22/24 207 lb (93.9 kg)  05/25/24 206 lb (93.4 kg)  04/06/24 205 lb (93 kg)     Subjective:    Patient ID: Paul Rubio, male    DOB: September 13, 1951, 73 y.o.   MRN: [id number]  HPI   Patient presents with a 2 to 3-week history of right sided low back pain.  The pain is located above his right gluteus roughly around the level of L5.  It is really near the iliac crest and in the ileum itself.  He describes it as a constant pain.  Nothing he does makes it better.  Nothing he does makes it worse.  He denies any hematuria or dysuria.  Urinalysis today shows no blood, no nitrates, no leukocyte esterase.  He denies any radiation of the pain to his testicles.  He denies any radiation of the pain down his right leg.  I am unable to reproduce the pain with palpation.  Movement does not trigger the pain.  Patient also presents with hearing loss in his right ear and is found to have a cerumen impaction Past Medical History:  Diagnosis Date   Coronary artery disease 2011    2 stents placed   H/O hiatal hernia    Hyperlipidemia    borderline; status post stress test x2, the last one 5-6 years ago and without significant abnormality per the patient   Hypertension    for approximately 5 years   Myocardial infarction [facility]) 2011   Pre-diabetes    Prediabetes    Past Surgical History:  Procedure Laterality Date   CHOLECYSTECTOMY     COLONOSCOPY     CORONARY ANGIOPLASTY     DES to mid and distal RCA 05/15/10   REVERSE SHOULDER ARTHROPLASTY Right 12/06/2020   Procedure: RIGHT REVERSE SHOULDER ARTHROPLASTY;  Surgeon: Addie Cordella Hamilton, MD;  Location: [facility] OR;  Service: Orthopedics;  Laterality: Right;   Right shoulder rotator cuff arthropathy.  12/06/2020   ROTATOR CUFF REPAIR Left 10/20/2013   Dr Addie   SHOULDER ARTHROSCOPY WITH OPEN ROTATOR CUFF REPAIR Left 10/20/2013   Procedure: SHOULDER ARTHROSCOPY WITH OPEN ROTATOR CUFF REPAIR;  Surgeon: Cordella Hamilton Addie,  MD;  Location: [facility] OR;  Service: Orthopedics;  Laterality: Left;  Left shoulder arthroscopy, debridement, open biceps tenodesis and rotator cuff repair.   Current Outpatient Medications on File Prior to Visit  Medication Sig Dispense Refill   aspirin  EC 81 MG tablet Take 81 mg by mouth daily.     cetirizine (ZYRTEC) 10 MG tablet Take 10 mg by mouth at bedtime.     cholecalciferol (VITAMIN D ) 25 MCG (1000 UNIT) tablet Take 1,000 Units by mouth daily.     donepezil  (ARICEPT ) 10 MG tablet Take 1 tablet (10 mg total) by mouth at bedtime. 90 tablet 3   ibuprofen (ADVIL) 200 MG tablet Take 400 mg by mouth every 8 (eight) hours as needed (pain.).     JARDIANCE  10 MG TABS tablet TAKE 1 TABLET(10 MG) BY MOUTH DAILY BEFORE BREAKFAST 30 tablet 11   latanoprost (XALATAN) 0.005 % ophthalmic solution Place 1 drop into both eyes at bedtime.     losartan  (COZAAR ) 50 MG tablet TAKE 1 TABLET(50 MG) BY MOUTH DAILY 90 tablet 1   memantine  (NAMENDA  XR) 14 MG CP24 24 hr capsule TAKE 1 CAPSULE(14 MG) BY MOUTH DAILY 90 capsule 1   metFORMIN  (GLUCOPHAGE ) 850 MG tablet TAKE 1 TABLET(850 MG) BY MOUTH TWICE DAILY WITH A MEAL 180  tablet 1   metoprolol  tartrate (LOPRESSOR ) 25 MG tablet Take 0.5 tablets (12.5 mg total) by mouth 2 (two) times daily. 90 tablet 3   nitroGLYCERIN  (NITROSTAT ) 0.4 MG SL tablet Place 1 tablet (0.4 mg total) under the tongue every 5 (five) minutes as needed for chest pain. 25 tablet 2   Omega-3 Fatty Acids (FISH OIL PO) Take by mouth.     rosuvastatin  (CRESTOR ) 10 MG tablet TAKE 1 TABLET(10 MG) BY MOUTH DAILY 90 tablet 3   No current facility-administered medications on file prior to visit.   No Known Allergies Social History   Socioeconomic History   Marital status: Married    Spouse name: Not on file   Number of children: 2   Years of education: Not on file   Highest education level: Associate degree: occupational, scientist, product/process development, or vocational program  Occupational History   Not on file   Tobacco Use   Smoking status: Former    Current packs/day: 0.00    Types: Cigarettes    Quit date: 12/03/1968    Years since quitting: 55.9   Smokeless tobacco: Never  Vaping Use   Vaping status: Never Used  Substance and Sexual Activity   Alcohol use: No   Drug use: No   Sexual activity: Not on file  Other Topics Concern   Not on file  Social History Narrative   Quit tobacco greater than 20 years ago with approximately 10 pack-year history. Denies alcohol or drug abuse   Social Drivers of Corporate Investment Banker Strain: Low Risk  (10/20/2024)   Overall Financial Resource Strain (CARDIA)    Difficulty of Paying Living Expenses: Not hard at all  Food Insecurity: No Food Insecurity (10/20/2024)   Hunger Vital Sign    Worried About Running Out of Food in the Last Year: Never true    Ran Out of Food in the Last Year: Never true  Transportation Needs: No Transportation Needs (10/20/2024)   PRAPARE - Administrator, Civil Service (Medical): No    Lack of Transportation (Non-Medical): No  Physical Activity: Sufficiently Active (10/20/2024)   Exercise Vital Sign    Days of Exercise per Week: 5 days    Minutes of Exercise per Session: 40 min  Stress: No Stress Concern Present (10/20/2024)   [employer] of Occupational Health - Occupational Stress Questionnaire    Feeling of Stress: Not at all  Social Connections: Moderately Integrated (10/20/2024)   Social Connection and Isolation Panel    Frequency of Communication with Friends and Family: More than three times a week    Frequency of Social Gatherings with Friends and Family: More than three times a week    Attends Religious Services: 1 to 4 times per year    Active Member of [employer] or Organizations: No    Attends Engineer, Structural: Not on file    Marital Status: Married  Catering Manager Violence: Not At Risk (04/06/2024)   Humiliation, Afraid, Rape, and Kick questionnaire    Fear of Current or  Ex-Partner: No    Emotionally Abused: No    Physically Abused: No    Sexually Abused: No   Family History  Problem Relation Age of Onset   Heart disease Father    Heart attack Other    Sudden death Paternal Uncle        possible MI   Colon cancer Neg Hx      Review of Systems  All other systems reviewed and  are negative.      Objective:   Physical Exam Vitals reviewed.  Constitutional:      General: He is not in acute distress.    Appearance: He is well-developed. He is not diaphoretic.  HENT:     Head: Normocephalic and atraumatic.     Right Ear: External ear normal. There is impacted cerumen.     Left Ear: External ear normal.  Neck:     Thyroid : No thyromegaly.     Vascular: No JVD.     Trachea: No tracheal deviation.  Cardiovascular:     Rate and Rhythm: Normal rate and regular rhythm.     Heart sounds: Normal heart sounds. No murmur heard.    No friction rub. No gallop.  Pulmonary:     Effort: Pulmonary effort is normal. No respiratory distress.     Breath sounds: Normal breath sounds. No stridor. No wheezing or rales.  Chest:     Chest wall: No tenderness.  Abdominal:     General: Bowel sounds are normal. There is no distension.     Palpations: Abdomen is soft. There is no mass.     Tenderness: There is no abdominal tenderness. There is no guarding or rebound.  Musculoskeletal:        General: No tenderness. Normal range of motion.     Cervical back: Normal range of motion and neck supple.     Lumbar back: Spasms present. No lacerations, tenderness or bony tenderness. Normal range of motion. Negative right straight leg raise test and negative left straight leg raise test.       Back:  Lymphadenopathy:     Cervical: No cervical adenopathy.  Skin:    General: Skin is warm.  Neurological:     Mental Status: He is alert and oriented to person, place, and time.     Cranial Nerves: No cranial nerve deficit.     Motor: No abnormal muscle tone.      Coordination: Coordination normal.     Deep Tendon Reflexes: Reflexes are normal and symmetric.  Psychiatric:        Behavior: Behavior normal.        Thought Content: Thought content normal.        Judgment: Judgment normal.          Assessment & Plan:  Acute right-sided low back pain without sciatica - Plan: DG Hip Unilat W OR W/O Pelvis 2-3 Views Right, DG Lumbar Spine Complete, Urinalysis, Routine w reflex microscopic Cerumen impaction was removed with irrigation and lavage.  Urinalysis is negative.  I feel the low back pain is likely muscular.  Recommended x-rays of the lower back and of the pelvis to rule out any skeletal lesions.  If x-rays are negative I would recommend physical therapy for low back pain

## 2024-11-14 ENCOUNTER — Other Ambulatory Visit: Payer: Self-pay | Admitting: Family Medicine

## 2024-11-14 ENCOUNTER — Other Ambulatory Visit (HOSPITAL_BASED_OUTPATIENT_CLINIC_OR_DEPARTMENT_OTHER): Payer: Self-pay | Admitting: Family

## 2024-11-14 DIAGNOSIS — I25118 Atherosclerotic heart disease of native coronary artery with other forms of angina pectoris: Secondary | ICD-10-CM

## 2024-11-16 ENCOUNTER — Other Ambulatory Visit: Payer: Self-pay

## 2024-11-16 NOTE — Telephone Encounter (Signed)
 Prescription Request  11/16/2024  LOV: 10/22/24  What is the name of the medication or equipment? metFORMIN  (GLUCOPHAGE ) 850 MG tablet [[id number]]   Have you contacted your pharmacy to request a refill? Yes   Which pharmacy would you like this sent to?  [id number] - [place], [place] - [address] [place] [address] Phone: [phone] Fax: [phone]    Patient notified that their request is being sent to the clinical staff for review and that they should receive a response within 2 business days.   Please advise at [facility] [phone]

## 2024-11-18 NOTE — Telephone Encounter (Signed)
 Duplicate request, refilled 11/16/24.  Requested Prescriptions  Pending Prescriptions Disp Refills   metFORMIN  (GLUCOPHAGE ) 850 MG tablet 180 tablet 1     Endocrinology:  Diabetes - Biguanides Failed - 11/18/2024  1:32 PM      Failed - HBA1C is between 0 and 7.9 and within 180 days    Hgb A1c MFr Bld  Date Value Ref Range Status  01/03/2024 7.2 (H) <5.7 % of total Hgb Final    Comment:    For someone without known diabetes, a hemoglobin A1c value of 6.5% or greater indicates that they may have  diabetes and this should be confirmed with a follow-up  test. . For someone with known diabetes, a value <7% indicates  that their diabetes is well controlled and a value  greater than or equal to 7% indicates suboptimal  control. A1c targets should be individualized based on  duration of diabetes, age, comorbid conditions, and  other considerations. . Currently, no consensus exists regarding use of hemoglobin A1c for diagnosis of diabetes for children. .          Failed - Valid encounter within last 6 months    Recent Outpatient Visits           3 weeks ago Acute right-sided low back pain without sciatica   [place] [facility] Du[place], Butler DASEN, MD   7 months ago General medical exam   [place] [facility] Duanne Butler DASEN, MD   10 months ago Other fatigue   [place] [facility] Duanne Butler DASEN, MD   1 year ago Memory loss   [place] [facility] Duanne Butler DASEN, MD   1 year ago General medical exam   [place] [facility] Duanne Butler DASEN, MD       Future Appointments             In 2 months Vannie Reche RAMAN, NP [facility], [place] [facility]            Passed - Cr in normal range and within 360 days    Creat  Date Value Ref Range Status  04/06/2024 1.19 0.70 - 1.28 mg/dL Final   Creatinine, Urine  Date Value Ref Range Status   03/26/2023 48 20 - 320 mg/dL Final         Passed - eGFR in normal range and within 360 days    GFR, Est African American  Date Value Ref Range Status  01/06/2021 90 > OR = 60 mL/min/1.25m2 Final   GFR, Est Non African American  Date Value Ref Range Status  01/06/2021 77 > OR = 60 mL/min/1.31m2 Final   GFR  Date Value Ref Range Status  07/21/2013 73.89 >60.00 mL/min Final   eGFR  Date Value Ref Range Status  01/03/2024 66 > OR = 60 mL/min/1.80m2 Final         Passed - B12 Level in normal range and within 720 days    Vitamin B-12  Date Value Ref Range Status  05/25/2024 406 232 - 1,245 pg/mL Final         Passed - CBC within normal limits and completed in the last 12 months    WBC  Date Value Ref Range Status  04/06/2024 8.3 3.8 - 10.8 Thousand/uL Final   RBC  Date Value Ref Range Status  04/06/2024 4.84 4.20 - 5.80 Million/uL Final   Hemoglobin  Date Value Ref Range Status  04/06/2024 14.0 13.2 - 17.1 g/dL Final   HCT  Date Value Ref Range Status  04/06/2024 42.7 38.5 - 50.0 % Final   MCHC  Date Value Ref Range Status  04/06/2024 32.8 32.0 - 36.0 g/dL Final    Comment:    For adults, a slight decrease in the calculated MCHC value (in the range of 30 to 32 g/dL) is most likely not clinically significant; however, it should be interpreted with caution in correlation with other red cell parameters and the patient's clinical condition.    [facility]  Date Value Ref Range Status  04/06/2024 28.9 27.0 - 33.0 pg Final   MCV  Date Value Ref Range Status  04/06/2024 88.2 80.0 - 100.0 fL Final   No results found for: PLTCOUNTKUC, LABPLAT, POCPLA RDW  Date Value Ref Range Status  04/06/2024 12.3 11.0 - 15.0 % Final

## 2024-12-16 ENCOUNTER — Other Ambulatory Visit: Payer: Self-pay | Admitting: Family Medicine

## 2025-01-04 ENCOUNTER — Other Ambulatory Visit: Payer: Self-pay | Admitting: Family Medicine

## 2025-02-12 ENCOUNTER — Ambulatory Visit (HOSPITAL_BASED_OUTPATIENT_CLINIC_OR_DEPARTMENT_OTHER): Admitting: Family

## 2025-04-06 ENCOUNTER — Encounter: Admitting: Family Medicine
